# Patient Record
Sex: Female | Born: 1960 | Race: White | Hispanic: No | Smoking: Current every day smoker
Health system: Southern US, Community
[De-identification: ages and names within clinical notes are randomized; demographics above are authoritative.]

## PROBLEM LIST (undated history)

## (undated) DIAGNOSIS — J449 Chronic obstructive pulmonary disease, unspecified: Secondary | ICD-10-CM

## (undated) DIAGNOSIS — K219 Gastro-esophageal reflux disease without esophagitis: Secondary | ICD-10-CM

## (undated) DIAGNOSIS — Z720 Tobacco use: Secondary | ICD-10-CM

## (undated) DIAGNOSIS — G500 Trigeminal neuralgia: Secondary | ICD-10-CM

## (undated) DIAGNOSIS — E559 Vitamin D deficiency, unspecified: Secondary | ICD-10-CM

## (undated) DIAGNOSIS — I10 Essential (primary) hypertension: Secondary | ICD-10-CM

## (undated) DIAGNOSIS — E785 Hyperlipidemia, unspecified: Secondary | ICD-10-CM

## (undated) DIAGNOSIS — L732 Hidradenitis suppurativa: Secondary | ICD-10-CM

## (undated) HISTORY — DX: Tobacco use: Z72.0

## (undated) HISTORY — DX: Essential (primary) hypertension: I10

## (undated) HISTORY — PX: CHOLECYSTECTOMY: SHX55

## (undated) HISTORY — DX: Hyperlipidemia, unspecified: E78.5

## (undated) HISTORY — DX: Trigeminal neuralgia: G50.0

## (undated) HISTORY — PX: DILATION AND CURETTAGE OF UTERUS: SHX78

## (undated) HISTORY — DX: Hidradenitis suppurativa: L73.2

## (undated) HISTORY — DX: Vitamin D deficiency, unspecified: E55.9

---

## 2002-01-26 ENCOUNTER — Encounter: Payer: Self-pay | Admitting: General Surgery

## 2002-01-26 ENCOUNTER — Ambulatory Visit (HOSPITAL_COMMUNITY): Admission: RE | Admit: 2002-01-26 | Discharge: 2002-01-26 | Payer: Self-pay | Admitting: General Surgery

## 2002-04-10 ENCOUNTER — Ambulatory Visit (HOSPITAL_COMMUNITY): Admission: RE | Admit: 2002-04-10 | Discharge: 2002-04-10 | Payer: Self-pay | Admitting: General Surgery

## 2006-12-27 ENCOUNTER — Ambulatory Visit (HOSPITAL_COMMUNITY): Admission: RE | Admit: 2006-12-27 | Discharge: 2006-12-27 | Payer: Self-pay | Admitting: Family Medicine

## 2010-08-03 ENCOUNTER — Encounter (INDEPENDENT_AMBULATORY_CARE_PROVIDER_SITE_OTHER): Payer: Self-pay | Admitting: *Deleted

## 2010-08-08 ENCOUNTER — Ambulatory Visit: Payer: Self-pay | Admitting: Gastroenterology

## 2010-08-22 ENCOUNTER — Ambulatory Visit: Payer: Self-pay | Admitting: Gastroenterology

## 2010-08-23 ENCOUNTER — Encounter: Payer: Self-pay | Admitting: Gastroenterology

## 2011-01-30 NOTE — Letter (Signed)
Summary: [facility] Instructions  [facility]  [address] [place], [place] [address]   Phone: [phone]  Fax: [phone]       [address]    52-Feb-1962    MRN: [id number]        Procedure Day Grace [name]:  Jake Shark  08/22/10     Arrival Time:  8:00AM     Procedure Time:  9:00AM     Location of Procedure:                    _ X_  [facility] (4th Floor)                        PREPARATION FOR COLONOSCOPY WITH MOVIPREP   Starting 5 days prior to your procedure 08/17/10 do not eat nuts, seeds, popcorn, corn, beans, peas,  salads, or any raw vegetables.  Do not take any fiber supplements (e.g. Metamucil, Citrucel, and Benefiber).  THE DAY BEFORE YOUR PROCEDURE         DATE: 08/21/10  DAY: MONDAY  1.  Drink clear liquids the entire day-NO SOLID FOOD  2.  Do not drink anything colored red or purple.  Avoid juices with pulp.  No orange juice.  3.  Drink at least 64 oz. (8 glasses) of fluid/clear liquids during the day to prevent dehydration and help the prep work efficiently.  CLEAR LIQUIDS INCLUDE: Water Jello Ice Popsicles Tea (sugar ok, no milk/cream) Powdered fruit flavored drinks Coffee (sugar ok, no milk/cream) Gatorade Juice: apple, white grape, white cranberry  Lemonade Clear bullion, consomm, broth Carbonated beverages (any kind) Strained chicken noodle soup Hard Candy                             4.  In the morning, mix first dose of MoviPrep solution:    Empty 1 Pouch A and 1 Pouch B into the disposable container    Add lukewarm drinking water to the top line of the container. Mix to dissolve    Refrigerate (mixed solution should be used within 24 hrs)  5.  Begin drinking the prep at 5:00 p.m. The MoviPrep container is divided by 4 marks.   Every 15 minutes drink the solution down to the next mark (approximately 8 oz) until the full liter is complete.   6.  Follow completed prep with 16 oz of clear liquid of your choice (Nothing  red or purple).  Continue to drink clear liquids until bedtime.  7.  Before going to bed, mix second dose of MoviPrep solution:    Empty 1 Pouch A and 1 Pouch B into the disposable container    Add lukewarm drinking water to the top line of the container. Mix to dissolve    Refrigerate  THE DAY OF YOUR PROCEDURE      DATE: 08/22/10  DAY: TUESDAY  Beginning at 4:00AM (5 hours before procedure):         1. Every 15 minutes, drink the solution down to the next mark (approx 8 oz) until the full liter is complete.  2. Follow completed prep with 16 oz. of clear liquid of your choice.    3. You may drink clear liquids until 7:00AM (2 HOURS BEFORE PROCEDURE).   MEDICATION INSTRUCTIONS  Unless otherwise instructed, you should take regular prescription medications with a small sip of water   as early as possible the morning  of your procedure.        OTHER INSTRUCTIONS  You will need a responsible adult at least 52 years of age to accompany you and drive you home.   This person must remain in the waiting room during your procedure.  Wear loose fitting clothing that is easily removed.  Leave jewelry and other valuables at home.  However, you may wish to bring a book to read or  an iPod/MP3 player to listen to music as you wait for your procedure to start.  Remove all body piercing jewelry and leave at home.  Total time from sign-in until discharge is approximately 2-3 hours.  You should go home directly after your procedure and rest.  You can resume normal activities the  day after your procedure.  The day of your procedure you should not:   Drive   Make legal decisions   Operate machinery   Drink alcohol   Return to work  You will receive specific instructions about eating, activities and medications before you leave.    The above instructions have been reviewed and explained to me by   Karl Bales RN  August  52, 2011 11:35 AM    I fully understand and can  verbalize these instructions _____________________________ Date _________

## 2011-01-30 NOTE — Procedures (Signed)
Summary: Colonoscopy  Patient: [name] Note: All result statuses are Final unless otherwise noted.  Tests: (1) Colonoscopy (COL)   COL Colonoscopy           DONE     [facility]     [address]     [place], [place]  [address]           COLONOSCOPY PROCEDURE REPORT           PATIENT:  [name], Grace  MR#:  [id number]     BIRTHDATE:  10-07-1961, 49 yrs. old  GENDER:  female     ENDOSCOPIST:  Judie Petit T. Russella Dar, MD, [facility]     Referred by:  Kyra Manges, M.D.     PROCEDURE DATE:  08/22/2010     PROCEDURE:  Colonoscopy with hot biopsy and snare polypectomy     ASA CLASS:  Class II     INDICATIONS:  1) hematochezia  2) diarrhea     MEDICATIONS:   Fentanyl 75 mcg IV, Versed 9 mg IV     DESCRIPTION OF PROCEDURE:   After the risks benefits and     alternatives of the procedure were thoroughly explained, informed     consent was obtained.  Digital rectal exam was performed and     revealed no abnormalities.   The LB [id number]-[id number] endoscope     was introduced through the anus and advanced to the terminal ileum     which was intubated for a short distance, without limitations.     The quality of the prep was excellent, using MoviPrep.  The     instrument was then slowly withdrawn as the colon was fully     examined.     <<PROCEDUREIMAGES>>     FINDINGS:  Two polyps were found in the transverse colon. They     were 3 - 6 mm in size. Polyps were snared without cautery.     Retrieval was successful. Two polyps were found in the descending     colon. They were 4 - 5 mm in size. Polyps were snared without     cautery. Retrieval was successful. Six polyps were found in the     sigmoid colon. They were 3 - 4 mm in size. The polyps were removed     using hot biopsy forceps.  This was otherwise a normal examination     of the colon.  The terminal ileum appeared normal.  Retroflexed     views in the rectum revealed internal hemorrhoids., moderate. The     time to cecum =  1.5   minutes. The scope was then withdrawn (time     =  9.75  min) from the patient and the procedure completed.           COMPLICATIONS:  None           ENDOSCOPIC IMPRESSION:     1) 3 - 6 mm Two polyps in the transverse colon     2) 4 - 5 mm Two polyps in the descending colon     3) 3 - 4 mm Six polyps in the sigmoid colon     4) Normal terminal ileum     5) Internal hemorrhoids           RECOMMENDATIONS:     1) No aspirin or NSAID's for 2 weeks     2) Await pathology results     3) Repeat Colonoscopy in 3-5 years if  polyps adenomatous.     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 Russella Dar, MD, [facility]           n.     eSIGNED:   Venita Lick. Stark at 08/22/2010 09:35 AM           Charlsie Quest, [id number]  Note: An exclamation mark (!) indicates a result that was not dispersed into the flowsheet. Document Creation Date: 08/22/2010 9:36 AM _______________________________________________________________________  (1) Order result status: Final Collection or observation date-time: 08/22/2010 09:22 Requested date-time:  Receipt date-time:  Reported date-time:  Referring Physician:   Ordering Physician: Claudette Head [phone]) Specimen Source:  Source: Launa Grill Order Number: [id number] Lab site:   Appended Document: Colonoscopy     Procedures Next Due Date:    Colonoscopy: 07/2013

## 2011-01-30 NOTE — Miscellaneous (Signed)
Summary: [facility] previsit  Clinical Lists Changes  Medications: Added new medication of MOVIPREP 100 GM  SOLR (PEG-KCL-NACL-NASULF-NA ASC-C) As per prep instructions. - Signed Rx of MOVIPREP 100 GM  SOLR (PEG-KCL-NACL-NASULF-NA ASC-C) As per prep instructions.;  #1 x 0;  Signed;  Entered by: Karl Bales RN;  Authorized by: Meryl Dare MD [facility];  Method used: Electronically to [facility], [place], [place], [place]  [address], Ph: [phone], Fax: [phone] Allergies: Added new allergy or adverse reaction of PREDNISONE Observations: Added new observation of NKA: F (08/08/2010 11:04)    Prescriptions: MOVIPREP 100 GM  SOLR (PEG-KCL-NACL-NASULF-NA ASC-C) As per prep instructions.  #1 x 0   Entered by:   Karl Bales RN   Authorized by:   Meryl Dare MD [facility]   Signed by:   Karl Bales RN on 08/08/2010   Method used:   Electronically to        [facility] [address] (retail)       [address]       [place], [place]  [address]       Ph: [phone]       Fax: [phone]   RxID:   [id number]

## 2011-01-30 NOTE — Letter (Signed)
Summary: Patient Notice- Polyp Results  [facility]  [address] [place], [place] [address]   Phone: [phone]  Fax: [phone]        August 23, 2010 MRN: [id number]    HAILEIGH PITZ [address] [place], [place]  [address]    Dear Ms. Sellin,  I am pleased to inform you that the colon polyp(s) removed during your recent colonoscopy was (were) found to be benign (no cancer detected) upon pathologic examination.  I recommend you have a repeat colonoscopy examination in 3 years to look for recurrent polyps, as having colon polyps increases your risk for having recurrent polyps or even colon cancer in the future.  Should you develop new or worsening symptoms of abdominal pain, bowel habit changes or bleeding from the rectum or bowels, please schedule an evaluation with either your primary care physician or with me.  Continue treatment plan as outlined the day of your exam.  Please call us if you are having persistent problems or have questions about your condition that have not been fully answered at this time.  Sincerely,  Meryl Dare MD [facility]  This letter has been electronically signed by your physician.  Appended Document: Patient Notice- Polyp Results letter mailed

## 2011-05-18 NOTE — Op Note (Signed)
[facility]  Patient:    Grace [name], Grace [name] Visit Number: [id number] MRN: [id number]          Service Type: DSU Location: DAY Attending Physician:  Dalia Heading Dictated by:   Franky Macho, M.D. Proc. Date: 04/10/02 Admit Date:  04/10/2002   CC:         Colette Ribas, M.D.   Operative Report  PREOPERATIVE DIAGNOSIS:  Chronic cholecystitis.  POSTOPERATIVE DIAGNOSIS:  Chronic cholecystitis.  OPERATION/PROCEDURE:  Laparoscopic cholecystectomy.  SURGEON:  Franky Macho, M.D.  ASSISTANT:  Arna Snipe, M.D.  ANESTHESIA:  General endotracheal anesthesia.  INDICATIONS:  The patient is a 50 year old white female who presents with biliary colic secondary to chronic cholecystitis.  The risks and benefits of the procedure including bleeding, infection, hepatobiliary injury, and the possibility of an open procedure were fully explained to the patient, who gave informed consent.  DESCRIPTION OF PROCEDURE:  The patient was placed in the supine position. After induction of general endotracheal anesthesia, the abdomen was prepped and draped using the usual sterile technique with Betadine.  A subumbilical incision was made down to the fascia.  A Veress needle was introduced into the abdominal cavity and confirmation of placement was done using the saline drop test to the abdomen which was then insufflated to 16 mmHg pressure.  An 11 mm trocar was introduced into the abdominal cavity under direct visualization without difficulty.  The patient was placed in reverse Trendelenburg position and additional 11 mm trocar was placed in the epigastric region and 5 mm trocars were placed in the right upper quadrant and right flank regions.  Liver was inspected and noted to be within normal limits.  The gallbladder was retracted superiorly and laterally.  The dissection was begun around the infundibulum of the gallbladder.  The cystic duct was first  identified.  Its junction to the infundibulum was first identified and the clips were placed proximally and distally on the cystic duct and cystic duct was divided.  This was likewise done to the cystic artery.  The gallbladder was then freed away from the gallbladder fossa using Bovie electrocautery.  The gallbladder was delivered through the epigastric trocar site without difficulty.  The gallbladder fossa was inspected and no abnormal bleeding or bile leakage was noted.  Surgicel was placed in the gallbladder fossa.  Subhepatic space as well as the right hepatic gutter were irrigated with normal saline.  All fluid and air were then evacuated from the abdominal cavity prior to removal of the trocars.  All wounds were irrigated with normal saline.  All wounds were injected with 0.5% Marcaine.  The subumbilical fascia was reapproximated using an 0 Vicryl interrupted suture.  All skin incisions were closed using staples.  Betadine ointment and dry sterile dressings were applied.  All tape and needle counts correct at the end of the procedure.  The patient was extubated in the operating room and went back to the recovery room awake in stable condition. No complications.  Specimen was the gallbladder.  Blood loss minimal. Dictated by:   Franky Macho, M.D. Attending Physician:  Dalia Heading DD:  04/10/02 TD:  04/11/02 Job: [id number]

## 2011-05-18 NOTE — H&P (Signed)
[facility]  Patient:    Grace [name], Grace [name] Visit Number: [id number] MRN: [id number]          Service Type: OUT Location: RAD Attending Physician:  Dalia Heading Dictated by:   Franky Macho, M.D. Admit Date:  01/26/2002 Discharge Date: 01/26/2002   CC:         Colette Ribas, M.D.   History and Physical  AGE:  50 years old.  CHIEF COMPLAINT:  Chronic cholecystitis.  HISTORY OF PRESENT ILLNESS:  Patient is a 50 year old white female who was referred for evaluation and treatment of epigastric pain and bloating.  She has had reflux symptoms for many months which are currently controlled with Nexium.  They are worse at night when lying down.  She does have intermittent upper abdominal pain with radiation to the back, nausea, vomiting, and some fatty food intolerance.  No fever, chills, or jaundice have been noted.  She does have postprandial symptoms.  PAST MEDICAL HISTORY:  Extrinsic allergies.  PAST SURGICAL HISTORY:  D&C, oral surgery.  CURRENT MEDICATIONS:  Nexium, Depo-Provera shots, Nasonex.  ALLERGIES:  PREDNISONE.  REVIEW OF SYSTEMS:  Patient does smoke a pack of cigarettes a day.  She denies any significant alcohol use.  She denies any other cardiopulmonary difficulties or bleeding disorders.  PHYSICAL EXAMINATION:  GENERAL:  Patient is a well-developed, well-nourished white female in no acute distress.  VITAL SIGNS:  She is afebrile and vital signs are stable.  HEENT:  No scleral icterus.  LUNGS:  Clear to auscultation with equal breath sounds bilaterally.  HEART:  Regular rate and rhythm without S3, S4, or murmurs.  ABDOMEN:  Soft, nontender, and nondistended.  No hepatosplenomegaly or masses are noted.  No herniae are noted.  LABORATORY AND ACCESSORY DATA:  Ultrasound of the gallbladder is negative.  Hepatobiliary scan reveals chronic cholecystitis with a low gallbladder ejection fraction at 19%.   Reproducible symptoms are noted.  IMPRESSION:  Chronic cholecystitis.  PLAN:  The patient is scheduled for a laparoscopic cholecystectomy on April 10, 2002.  The risks and benefits of the procedure including bleeding, infection, hepatobiliary injury, and the possibility of an open procedure were fully explained to the patient, who gave informed consent. Dictated by:   Franky Macho, M.D. Attending Physician:  Dalia Heading DD:  03/19/02 TD:  03/21/02 Job: [id number]

## 2013-04-06 ENCOUNTER — Other Ambulatory Visit: Payer: Self-pay | Admitting: Family Medicine

## 2013-04-07 ENCOUNTER — Other Ambulatory Visit: Payer: Self-pay | Admitting: Family Medicine

## 2013-04-07 MED ORDER — HYDROCODONE-ACETAMINOPHEN 10-500 MG PO TABS: 1.0000 | ORAL_TABLET | Freq: Three times a day (TID) | ORAL | Status: DC | PRN

## 2013-04-07 NOTE — Telephone Encounter (Signed)
Last seen 01/15/13, last filled 03/04/13. If approved have nurse call her at [phone] to pick up

## 2013-04-09 ENCOUNTER — Other Ambulatory Visit: Payer: Self-pay | Admitting: Family Medicine

## 2013-04-09 DIAGNOSIS — G500 Trigeminal neuralgia: Secondary | ICD-10-CM

## 2013-04-09 MED ORDER — HYDROCODONE-ACETAMINOPHEN 10-325 MG PO TABS: ORAL_TABLET | ORAL | Status: DC

## 2013-05-15 ENCOUNTER — Encounter: Payer: Self-pay | Admitting: Family Medicine

## 2013-05-15 ENCOUNTER — Ambulatory Visit (INDEPENDENT_AMBULATORY_CARE_PROVIDER_SITE_OTHER): Payer: BLUE CROSS/BLUE SHIELD | Admitting: Family Medicine

## 2013-05-15 VITALS — BP 119/76 | HR 93 | Temp 99.0°F | Ht 65.0 in | Wt 175.8 lb

## 2013-05-15 DIAGNOSIS — G500 Trigeminal neuralgia: Secondary | ICD-10-CM

## 2013-05-15 DIAGNOSIS — E559 Vitamin D deficiency, unspecified: Secondary | ICD-10-CM

## 2013-05-15 DIAGNOSIS — I10 Essential (primary) hypertension: Secondary | ICD-10-CM | POA: Insufficient documentation

## 2013-05-15 DIAGNOSIS — E785 Hyperlipidemia, unspecified: Secondary | ICD-10-CM | POA: Insufficient documentation

## 2013-05-15 LAB — HEPATIC FUNCTION PANEL
ALT: 13 U/L (ref 0–35)
AST: 14 U/L (ref 0–37)
Albumin: 4.3 g/dL (ref 3.5–5.2)
Alkaline Phosphatase: 52 U/L (ref 39–117)
Bilirubin, Direct: 0.1 mg/dL (ref 0.0–0.3)
Indirect Bilirubin: 0.3 mg/dL (ref 0.0–0.9)
Total Bilirubin: 0.4 mg/dL (ref 0.3–1.2)
Total Protein: 6.7 g/dL (ref 6.0–8.3)

## 2013-05-15 LAB — BASIC METABOLIC PANEL WITH GFR
BUN: 10 mg/dL (ref 6–23)
CO2: 22 mEq/L (ref 19–32)
Calcium: 9.6 mg/dL (ref 8.4–10.5)
Chloride: 108 mEq/L (ref 96–112)
Creat: 0.82 mg/dL (ref 0.50–1.10)
GFR, Est African American: >89 mL/min
GFR, Est Non African American: 82 mL/min
Glucose, Bld: 99 mg/dL (ref 70–99)
Potassium: 4.3 mEq/L (ref 3.5–5.3)
Sodium: 139 mEq/L (ref 135–145)

## 2013-05-15 LAB — LIPID PANEL
Cholesterol: 211 mg/dL — ABNORMAL HIGH (ref 0–200)
HDL: 39 mg/dL — ABNORMAL LOW (ref >39)
LDL Cholesterol: 146 mg/dL — ABNORMAL HIGH (ref 0–99)
Total CHOL/HDL Ratio: 5.4 Ratio
Triglycerides: 130 mg/dL (ref <150)
VLDL: 26 mg/dL (ref 0–40)

## 2013-05-15 MED ORDER — HYDROCODONE-ACETAMINOPHEN 10-325 MG PO TABS: ORAL_TABLET | ORAL | Status: DC

## 2013-05-15 MED ORDER — LOSARTAN POTASSIUM 25 MG PO TABS: 25.0000 mg | ORAL_TABLET | Freq: Every day | ORAL | Status: DC

## 2013-05-15 MED ORDER — AMLODIPINE BESYLATE 10 MG PO TABS: 10.0000 mg | ORAL_TABLET | Freq: Every day | ORAL | Status: DC

## 2013-05-15 NOTE — Patient Instructions (Addendum)
Smoking Cessation Quitting smoking is important to your health and has many advantages. However, it is not always easy to quit since nicotine is a very addictive drug. Often times, people try 3 times or more before being able to quit. This document explains the best ways for you to prepare to quit smoking. Quitting takes hard work and a lot of effort, but you can do it. ADVANTAGES OF QUITTING SMOKING  You will live longer, feel better, and live better.  Your body will feel the impact of quitting smoking almost immediately.  Within 20 minutes, blood pressure decreases. Your pulse returns to its normal level.  After 8 hours, carbon monoxide levels in the blood return to normal. Your oxygen level increases.  After 24 hours, the chance of having a heart attack starts to decrease. Your breath, hair, and body stop smelling like smoke.  After 48 hours, damaged nerve endings begin to recover. Your sense of taste and smell improve.  After 72 hours, the body is virtually free of nicotine. Your bronchial tubes relax and breathing becomes easier.  After 2 to 12 weeks, lungs can hold more air. Exercise becomes easier and circulation improves.  The risk of having a heart attack, stroke, cancer, or lung disease is greatly reduced.  After 1 year, the risk of coronary heart disease is cut in half.  After 5 years, the risk of stroke falls to the same as a nonsmoker.  After 10 years, the risk of lung cancer is cut in half and the risk of other cancers decreases significantly.  After 15 years, the risk of coronary heart disease drops, usually to the level of a nonsmoker.  If you are pregnant, quitting smoking will improve your chances of having a healthy baby.  The people you live with, especially any children, will be healthier.  You will have extra money to spend on things other than cigarettes. QUESTIONS TO THINK ABOUT BEFORE ATTEMPTING TO QUIT You may want to talk about your answers with your  caregiver.  Why do you want to quit?  If you tried to quit in the past, what helped and what did not?  What will be the most difficult situations for you after you quit? How will you plan to handle them?  Who can help you through the tough times? Your family? Friends? A caregiver?  What pleasures do you get from smoking? What ways can you still get pleasure if you quit? Here are some questions to ask your caregiver:  How can you help me to be successful at quitting?  What medicine do you think would be best for me and how should I take it?  What should I do if I need more help?  What is smoking withdrawal like? How can I get information on withdrawal? GET READY  Set a quit date.  Change your environment by getting rid of all cigarettes, ashtrays, matches, and lighters in your home, car, or work. Do not let people smoke in your home.  Review your past attempts to quit. Think about what worked and what did not. GET SUPPORT AND ENCOURAGEMENT You have a better chance of being successful if you have help. You can get support in many ways.  Tell your family, friends, and co-workers that you are going to quit and need their support. Ask them not to smoke around you.  Get individual, group, or telephone counseling and support. Programs are available at local hospitals and health centers. Call your local health department for   information about programs in your area.  Spiritual beliefs and practices may help some smokers quit.  Download a "quit meter" on your computer to keep track of quit statistics, such as how long you have gone without smoking, cigarettes not smoked, and money saved.  Get a self-help book about quitting smoking and staying off of tobacco. LEARN NEW SKILLS AND BEHAVIORS  Distract yourself from urges to smoke. Talk to someone, go for a walk, or occupy your time with a task.  Change your normal routine. Take a different route to work. Drink tea instead of coffee.  Eat breakfast in a different place.  Reduce your stress. Take a hot bath, exercise, or read a book.  Plan something enjoyable to do every day. Reward yourself for not smoking.  Explore interactive web-based programs that specialize in helping you quit. GET MEDICINE AND USE IT CORRECTLY Medicines can help you stop smoking and decrease the urge to smoke. Combining medicine with the above behavioral methods and support can greatly increase your chances of successfully quitting smoking.  Nicotine replacement therapy helps deliver nicotine to your body without the negative effects and risks of smoking. Nicotine replacement therapy includes nicotine gum, lozenges, inhalers, nasal sprays, and skin patches. Some may be available over-the-counter and others require a prescription.  Antidepressant medicine helps people abstain from smoking, but how this works is unknown. This medicine is available by prescription.  Nicotinic receptor partial agonist medicine simulates the effect of nicotine in your brain. This medicine is available by prescription. Ask your caregiver for advice about which medicines to use and how to use them based on your health history. Your caregiver will tell you what side effects to look out for if you choose to be on a medicine or therapy. Carefully read the information on the package. Do not use any other product containing nicotine while using a nicotine replacement product.  RELAPSE OR DIFFICULT SITUATIONS Most relapses occur within the first 3 months after quitting. Do not be discouraged if you start smoking again. Remember, most people try several times before finally quitting. You may have symptoms of withdrawal because your body is used to nicotine. You may crave cigarettes, be irritable, feel very hungry, cough often, get headaches, or have difficulty concentrating. The withdrawal symptoms are only temporary. They are strongest when you first quit, but they will go away within  10 14 days. To reduce the chances of relapse, try to:  Avoid drinking alcohol. Drinking lowers your chances of successfully quitting.  Reduce the amount of caffeine you consume. Once you quit smoking, the amount of caffeine in your body increases and can give you symptoms, such as a rapid heartbeat, sweating, and anxiety.  Avoid smokers because they can make you want to smoke.  Do not let weight gain distract you. Many smokers will gain weight when they quit, usually less than 10 pounds. Eat a healthy diet and stay active. You can always lose the weight gained after you quit.  Find ways to improve your mood other than smoking. FOR MORE INFORMATION  www.smokefree.gov  Document Released: 12/11/2001 Document Revised: 06/17/2012 Document Reviewed: 03/27/2012 [employer] Patient Information 2013 [employer].       Dr Channon Ambrosini's Recommendations  Diet and Exercise discussed with patient.  For nutrition information, I recommend books:  1).Eat to Live by Dr Joel Fuhrman. 2).Prevent and Reverse Heart Disease by Dr Caldwell Esselstyn. 3) Dr Neal Barnard's Book: Reversing Diabetes  Exercise recommendations are:  If unable to walk, then   the patient can exercise in a chair 3 times a day. By flapping arms like a bird gently and raising legs outwards to the front.  If ambulatory, the patient can go for walks for 30 minutes 3 times a week. Then increase the intensity and duration as tolerated.  Goal is to try to attain exercise frequency to 5 times a week.  If applicable: Best to perform resistance exercises (machines or weights) 2 days a week and cardio type exercises 3 days per week.  

## 2013-05-15 NOTE — Progress Notes (Signed)
Patient ID: Grace [name], female   DOB: 19-Apr-1961, 52 y.o.   MRN: [id number] SUBJECTIVE: HPI: Patient is here for follow up of hypertension:  denies Headache;deniesChest Pain;denies weakness;denies Shortness of Breath or Orthopnea;denies Visual changes;denies palpitations;denies cough;denies pedal edema;denies symptoms of TIA or stroke; admits to Compliance with medications. denies Problems with medications.  Trigeminal neuralgia:better during warmer weather  Hyperlipidemia.   PMH/PSH: reviewed/updated in Epic  SH/FH: reviewed/updated in Epic  Allergies: reviewed/updated in Epic  Medications: reviewed/updated in Epic  Immunizations: reviewed/updated in Epic  ROS: As above in the HPI. All other systems are stable or negative.  OBJECTIVE: APPEARANCE:  Patient in no acute distress.The patient appeared well nourished and normally developed. Acyanotic. Waist: VITAL SIGNS:BP 119/76  Pulse 93  Temp(Src) 99 F (37.2 C) (Oral)  Ht 5\' 5"  (1.651 m)  Wt 175 lb 12.8 oz (79.742 kg)  BMI 29.25 kg/m2 overweight WF  SKIN: warm and  Dry without overt rashes, tattoos and scars  HEAD and Neck: without JVD, Head and scalp: normal Eyes:No scleral icterus. Fundi normal, eye movements normal. Ears: Auricle normal, canal normal, Tympanic membranes normal, insufflation normal. Nose: normal Throat: normal Neck & thyroid: normal  CHEST & LUNGS: Chest wall: normal Lungs: Clear  CVS: Reveals the PMI to be normally located. Regular rhythm, First and Second Heart sounds are normal,  absence of murmurs, rubs or gallops. Peripheral vasculature: Radial pulses: normal Dorsal pedis pulses: normal Posterior pulses: normal  ABDOMEN:  Appearance: normal Benign,, no organomegaly, no masses, no Abdominal Aortic enlargement. No Guarding , no rebound. No Bruits. Bowel sounds: normal  RECTAL: N/A GU: N/A  EXTREMETIES: nonedematous. Both Femoral and Pedal pulses are  normal.  MUSCULOSKELETAL:  Spine: normal Joints: intact  NEUROLOGIC: oriented to time,place and person; nonfocal. Strength is normal Sensory is normal Reflexes are normal Cranial Nerves are normal. The trigeminal neuralgia has settled down today with less cold weather.   ASSESSMENT:  HTN (hypertension) - Plan: losartan (COZAAR) 25 MG tablet, amLODipine (NORVASC) 10 MG tablet, BASIC METABOLIC PANEL WITH GFR  HLD (hyperlipidemia) - Plan: Hepatic function panel, Lipid panel  Trigeminal neuralgia - Plan: HYDROcodone-acetaminophen (NORCO) 10-325 MG per tablet, DISCONTINUED: HYDROcodone-acetaminophen (NORCO) 10-325 MG per tablet  Unspecified vitamin D deficiency - Plan: Vitamin D 25 hydroxy   PLAN: Orders Placed This Encounter  Procedures  . Vitamin D 25 hydroxy  . BASIC METABOLIC PANEL WITH GFR  . Hepatic function panel  . Lipid panel   No results found for this or any previous visit. Meds ordered this encounter  Medications  . medroxyPROGESTERone (DEPO-PROVERA) 150 MG/ML injection    Sig: Inject 150 mg into the muscle every 3 (three) months.  . DISCONTD: losartan (COZAAR) 25 MG tablet    Sig: Take 25 mg by mouth daily.  Marland Kitchen DISCONTD: amLODipine (NORVASC) 10 MG tablet    Sig: Take 10 mg by mouth daily.  Marland Kitchen DISCONTD: HYDROcodone-acetaminophen (NORCO) 10-325 MG per tablet    Sig: TAKE ONE TABLET BY MOUTH TWICE DAILY AS NEEDED FOR  TRIGEMINAL  NEURALGIA    Dispense:  60 tablet    Refill:  1  . losartan (COZAAR) 25 MG tablet    Sig: Take 1 tablet (25 mg total) by mouth daily.    Dispense:  90 tablet    Refill:  3  . amLODipine (NORVASC) 10 MG tablet    Sig: Take 1 tablet (10 mg total) by mouth daily.    Dispense:  90 tablet    Refill:  3  . HYDROcodone-acetaminophen (NORCO) 10-325 MG per tablet    Sig: TAKE ONE TABLET BY MOUTH TWICE DAILY AS NEEDED FOR  TRIGEMINAL  NEURALGIA    Dispense:  60 tablet    Refill:  1        Dr Woodroe Mode Recommendations  Diet and  Exercise discussed with patient.  For nutrition information, I recommend books:  1).Eat to Live by Dr Monico Hoar. 2).Prevent and Reverse Heart Disease by Dr Suzzette Righter. 3) Dr Katherina Right Book: Reversing Diabetes  Exercise recommendations are:  If unable to walk, then the patient can exercise in a chair 3 times a day. By flapping arms like a bird gently and raising legs outwards to the front.  If ambulatory, the patient can go for walks for 30 minutes 3 times a week. Then increase the intensity and duration as tolerated.  Goal is to try to attain exercise frequency to 5 times a week.  If applicable: Best to perform resistance exercises (machines or weights) 2 days a week and cardio type exercises 3 days per week.  RTC 4 months  Kennen Stammer P. [place] Charon, M.D.

## 2013-05-16 LAB — VITAMIN D 25 HYDROXY (VIT D DEFICIENCY, FRACTURES): Vit D, 25-Hydroxy: 43 ng/mL (ref 30–89)

## 2013-05-20 ENCOUNTER — Other Ambulatory Visit: Payer: Self-pay | Admitting: Family Medicine

## 2013-05-20 DIAGNOSIS — E785 Hyperlipidemia, unspecified: Secondary | ICD-10-CM

## 2013-05-20 MED ORDER — PRAVASTATIN SODIUM 20 MG PO TABS: 20.0000 mg | ORAL_TABLET | Freq: Every day | ORAL | Status: DC

## 2013-05-22 ENCOUNTER — Other Ambulatory Visit: Payer: Self-pay | Admitting: Family Medicine

## 2013-05-22 DIAGNOSIS — E785 Hyperlipidemia, unspecified: Secondary | ICD-10-CM

## 2013-05-22 MED ORDER — FENOFIBRATE 48 MG PO TABS: 48.0000 mg | ORAL_TABLET | Freq: Every day | ORAL | Status: DC

## 2013-06-01 ENCOUNTER — Ambulatory Visit (INDEPENDENT_AMBULATORY_CARE_PROVIDER_SITE_OTHER): Payer: BLUE CROSS/BLUE SHIELD | Admitting: Nurse Practitioner

## 2013-06-01 ENCOUNTER — Telehealth: Payer: Self-pay | Admitting: Nurse Practitioner

## 2013-06-01 ENCOUNTER — Encounter: Payer: Self-pay | Admitting: Nurse Practitioner

## 2013-06-01 ENCOUNTER — Telehealth: Payer: Self-pay | Admitting: Family Medicine

## 2013-06-01 VITALS — BP 137/76 | HR 105 | Temp 98.9°F | Ht 65.0 in | Wt 177.0 lb

## 2013-06-01 DIAGNOSIS — L02234 Carbuncle of groin: Secondary | ICD-10-CM

## 2013-06-01 MED ORDER — CIPROFLOXACIN HCL 500 MG PO TABS
500.0000 mg | ORAL_TABLET | Freq: Two times a day (BID) | ORAL | Status: DC
Start: 2013-06-01 — End: 2013-09-17

## 2013-06-01 NOTE — Telephone Encounter (Signed)
APPT MADE

## 2013-06-01 NOTE — Progress Notes (Signed)
  Subjective:    Patient ID: Grace [name], female    DOB: 1961/10/11, 52 y.o.   MRN: [id number]  HPI Patient has a lesion on left groin- Noticed several days ago- has gotten bigger- No drainage    Review of Systems  All other systems reviewed and are negative.       Objective:   Physical Exam  Constitutional: She appears well-developed and well-nourished.  Cardiovascular: Normal rate, normal heart sounds and intact distal pulses.   Pulmonary/Chest: Effort normal and breath sounds normal.  Skin: Skin is warm. There is erythema.  Indurated erythematous annular lesion left groin area    BP 137/76  Pulse 105  Temp(Src) 98.9 F (37.2 C) (Oral)  Ht 5\' 5"  (1.651 m)  Wt 177 lb (80.287 kg)  BMI 29.45 kg/m2       Assessment & Plan:  1. Carbuncle of groin Warm compress Ok to squeeze if comes to a head RTO prn - ciprofloxacin (CIPRO) 500 MG tablet; Take 1 tablet (500 mg total) by mouth 2 (two) times daily.  Dispense: 20 tablet; Refill: 0  Mary-Margaret Daphine Deutscher, FNP

## 2013-06-01 NOTE — Patient Instructions (Signed)
Abscess An abscess is an infected area that contains a collection of pus and debris.It can occur in almost any part of the body. An abscess is also known as a furuncle or boil. CAUSES  An abscess occurs when tissue gets infected. This can occur from blockage of oil or sweat glands, infection of hair follicles, or a minor injury to the skin. As the body tries to fight the infection, pus collects in the area and creates pressure under the skin. This pressure causes pain. People with weakened immune systems have difficulty fighting infections and get certain abscesses more often.  SYMPTOMS Usually an abscess develops on the skin and becomes a painful mass that is red, warm, and tender. If the abscess forms under the skin, you may feel a moveable soft area under the skin. Some abscesses break open (rupture) on their own, but most will continue to get worse without care. The infection can spread deeper into the body and eventually into the bloodstream, causing you to feel ill.  DIAGNOSIS  Your caregiver will take your medical history and perform a physical exam. A sample of fluid may also be taken from the abscess to determine what is causing your infection. TREATMENT  Your caregiver may prescribe antibiotic medicines to fight the infection. However, taking antibiotics alone usually does not cure an abscess. Your caregiver may need to make a small cut (incision) in the abscess to drain the pus. In some cases, gauze is packed into the abscess to reduce pain and to continue draining the area. HOME CARE INSTRUCTIONS   Only take over-the-counter or prescription medicines for pain, discomfort, or fever as directed by your caregiver.  If you were prescribed antibiotics, take them as directed. Finish them even if you start to feel better.  If gauze is used, follow your caregiver's directions for changing the gauze.  To avoid spreading the infection:  Keep your draining abscess covered with a  bandage.  Wash your hands well.  Do not share personal care items, towels, or whirlpools with others.  Avoid skin contact with others.  Keep your skin and clothes clean around the abscess.  Keep all follow-up appointments as directed by your caregiver. SEEK MEDICAL CARE IF:   You have increased pain, swelling, redness, fluid drainage, or bleeding.  You have muscle aches, chills, or a general ill feeling.  You have a fever. MAKE SURE YOU:   Understand these instructions.  Will watch your condition.  Will get help right away if you are not doing well or get worse. Document Released: 09/26/2005 Document Revised: 06/17/2012 Document Reviewed: 02/29/2012 Exit[employer] Patient Information 2014 [employer].  

## 2013-06-01 NOTE — Telephone Encounter (Signed)
OK to crush them

## 2013-06-01 NOTE — Telephone Encounter (Signed)
Patient aware.

## 2013-07-21 ENCOUNTER — Other Ambulatory Visit: Payer: Self-pay | Admitting: Family Medicine

## 2013-07-23 NOTE — Telephone Encounter (Signed)
Last seen MMM 06/01/13  Last filled 05/15/13   If approved print and route to nurse

## 2013-07-24 ENCOUNTER — Telehealth: Payer: Self-pay | Admitting: *Deleted

## 2013-07-24 NOTE — Telephone Encounter (Signed)
Called into pharmacy and left on voicemail 

## 2013-07-24 NOTE — Telephone Encounter (Signed)
Pt aware, script for Norco ready for pick up here.

## 2013-08-03 ENCOUNTER — Encounter: Payer: Self-pay | Admitting: Gastroenterology

## 2013-09-04 ENCOUNTER — Other Ambulatory Visit: Payer: Self-pay | Admitting: Family Medicine

## 2013-09-15 ENCOUNTER — Ambulatory Visit: Payer: BLUE CROSS/BLUE SHIELD | Admitting: Family Medicine

## 2013-09-17 ENCOUNTER — Encounter: Payer: Self-pay | Admitting: Family Medicine

## 2013-09-17 ENCOUNTER — Ambulatory Visit (INDEPENDENT_AMBULATORY_CARE_PROVIDER_SITE_OTHER): Payer: BLUE CROSS/BLUE SHIELD | Admitting: Family Medicine

## 2013-09-17 VITALS — BP 124/68 | HR 96 | Temp 99.7°F | Ht 64.0 in | Wt 177.0 lb

## 2013-09-17 DIAGNOSIS — G500 Trigeminal neuralgia: Secondary | ICD-10-CM

## 2013-09-17 DIAGNOSIS — E559 Vitamin D deficiency, unspecified: Secondary | ICD-10-CM | POA: Insufficient documentation

## 2013-09-17 DIAGNOSIS — E785 Hyperlipidemia, unspecified: Secondary | ICD-10-CM | POA: Insufficient documentation

## 2013-09-17 DIAGNOSIS — I10 Essential (primary) hypertension: Secondary | ICD-10-CM | POA: Insufficient documentation

## 2013-09-17 DIAGNOSIS — Z720 Tobacco use: Secondary | ICD-10-CM | POA: Insufficient documentation

## 2013-09-17 DIAGNOSIS — F17200 Nicotine dependence, unspecified, uncomplicated: Secondary | ICD-10-CM

## 2013-09-17 MED ORDER — LOSARTAN POTASSIUM 25 MG PO TABS: ORAL_TABLET | ORAL | Status: DC

## 2013-09-17 MED ORDER — AMLODIPINE BESYLATE 10 MG PO TABS: ORAL_TABLET | ORAL | Status: DC

## 2013-09-17 NOTE — Patient Instructions (Addendum)
    Dr Kewanda Poland's Recommendations  For nutrition information, I recommend books:  1).Eat to Live by Dr Joel Fuhrman. 2).Prevent and Reverse Heart Disease by Dr Caldwell Esselstyn. 3) Dr Neal Barnard's Book:  Program to Reverse Diabetes  Exercise recommendations are:  If unable to walk, then the patient can exercise in a chair 3 times a day. By flapping arms like a bird gently and raising legs outwards to the front.  If ambulatory, the patient can go for walks for 30 minutes 3 times a week. Then increase the intensity and duration as tolerated.  Goal is to try to attain exercise frequency to 5 times a week.  If applicable: Best to perform resistance exercises (machines or weights) 2 days a week and cardio type exercises 3 days per week.   Smoking Cessation Quitting smoking is important to your health and has many advantages. However, it is not always easy to quit since nicotine is a very addictive drug. Often times, people try 3 times or more before being able to quit. This document explains the best ways for you to prepare to quit smoking. Quitting takes hard work and a lot of effort, but you can do it. ADVANTAGES OF QUITTING SMOKING  You will live longer, feel better, and live better.  Your body will feel the impact of quitting smoking almost immediately.  Within 20 minutes, blood pressure decreases. Your pulse returns to its normal level.  After 8 hours, carbon monoxide levels in the blood return to normal. Your oxygen level increases.  After 24 hours, the chance of having a heart attack starts to decrease. Your breath, hair, and body stop smelling like smoke.  After 48 hours, damaged nerve endings begin to recover. Your sense of taste and smell improve.  After 72 hours, the body is virtually free of nicotine. Your bronchial tubes relax and breathing becomes easier.  After 2 to 12 weeks, lungs can hold more air. Exercise becomes easier and circulation improves.  The risk  of having a heart attack, stroke, cancer, or lung disease is greatly reduced.  After 1 year, the risk of coronary heart disease is cut in half.  After 5 years, the risk of stroke falls to the same as a nonsmoker.  After 10 years, the risk of lung cancer is cut in half and the risk of other cancers decreases significantly.  After 15 years, the risk of coronary heart disease drops, usually to the level of a nonsmoker.  If you are pregnant, quitting smoking will improve your chances of having a healthy baby.  The people you live with, especially any children, will be healthier.  You will have extra money to spend on things other than cigarettes. QUESTIONS TO THINK ABOUT BEFORE ATTEMPTING TO QUIT You may want to talk about your answers with your caregiver.  Why do you want to quit?  If you tried to quit in the past, what helped and what did not?  What will be the most difficult situations for you after you quit? How will you plan to handle them?  Who can help you through the tough times? Your family? Friends? A caregiver?  What pleasures do you get from smoking? What ways can you still get pleasure if you quit? Here are some questions to ask your caregiver:  How can you help me to be successful at quitting?  What medicine do you think would be best for me and how should I take it?  What should I   do if I need more help?  What is smoking withdrawal like? How can I get information on withdrawal? GET READY  Set a quit date.  Change your environment by getting rid of all cigarettes, ashtrays, matches, and lighters in your home, car, or work. Do not let people smoke in your home.  Review your past attempts to quit. Think about what worked and what did not. GET SUPPORT AND ENCOURAGEMENT You have a better chance of being successful if you have help. You can get support in many ways.  Tell your family, friends, and co-workers that you are going to quit and need their support. Ask  them not to smoke around you.  Get individual, group, or telephone counseling and support. Programs are available at local hospitals and health centers. Call your local health department for information about programs in your area.  Spiritual beliefs and practices may help some smokers quit.  Download a "quit meter" on your computer to keep track of quit statistics, such as how long you have gone without smoking, cigarettes not smoked, and money saved.  Get a self-help book about quitting smoking and staying off of tobacco. LEARN NEW SKILLS AND BEHAVIORS  Distract yourself from urges to smoke. Talk to someone, go for a walk, or occupy your time with a task.  Change your normal routine. Take a different route to work. Drink tea instead of coffee. Eat breakfast in a different place.  Reduce your stress. Take a hot bath, exercise, or read a book.  Plan something enjoyable to do every day. Reward yourself for not smoking.  Explore interactive web-based programs that specialize in helping you quit. GET MEDICINE AND USE IT CORRECTLY Medicines can help you stop smoking and decrease the urge to smoke. Combining medicine with the above behavioral methods and support can greatly increase your chances of successfully quitting smoking.  Nicotine replacement therapy helps deliver nicotine to your body without the negative effects and risks of smoking. Nicotine replacement therapy includes nicotine gum, lozenges, inhalers, nasal sprays, and skin patches. Some may be available over-the-counter and others require a prescription.  Antidepressant medicine helps people abstain from smoking, but how this works is unknown. This medicine is available by prescription.  Nicotinic receptor partial agonist medicine simulates the effect of nicotine in your brain. This medicine is available by prescription. Ask your caregiver for advice about which medicines to use and how to use them based on your health history.  Your caregiver will tell you what side effects to look out for if you choose to be on a medicine or therapy. Carefully read the information on the package. Do not use any other product containing nicotine while using a nicotine replacement product.  RELAPSE OR DIFFICULT SITUATIONS Most relapses occur within the first 3 months after quitting. Do not be discouraged if you start smoking again. Remember, most people try several times before finally quitting. You may have symptoms of withdrawal because your body is used to nicotine. You may crave cigarettes, be irritable, feel very hungry, cough often, get headaches, or have difficulty concentrating. The withdrawal symptoms are only temporary. They are strongest when you first quit, but they will go away within 10 14 days. To reduce the chances of relapse, try to:  Avoid drinking alcohol. Drinking lowers your chances of successfully quitting.  Reduce the amount of caffeine you consume. Once you quit smoking, the amount of caffeine in your body increases and can give you symptoms, such as a rapid heartbeat, sweating,   and anxiety.  Avoid smokers because they can make you want to smoke.  Do not let weight gain distract you. Many smokers will gain weight when they quit, usually less than 10 pounds. Eat a healthy diet and stay active. You can always lose the weight gained after you quit.  Find ways to improve your mood other than smoking. FOR MORE INFORMATION  www.smokefree.gov  Document Released: 12/11/2001 Document Revised: 06/17/2012 Document Reviewed: 03/27/2012 [employer] Patient Information 2014 [employer].  

## 2013-09-17 NOTE — Progress Notes (Signed)
Patient ID: Grace [name], female   DOB: [birth year], 52 y.o.   MRN: [id number] SUBJECTIVE: CC: Chief Complaint  Patient presents with  . Follow-up    4 MONTH SOME SINUS PROBLEMS     HPI: Patient is here for follow up of hyperlipidemia/HTN/Vit D Deficiency/tobaccouse: denies Headache;denies Chest Pain;denies weakness;denies Shortness of Breath and orthopnea;denies Visual changes;denies palpitations;denies cough;denies pedal edema;denies symptoms of TIA or stroke;deniesClaudication symptoms. admits to Compliance with medications; denies Problems with medications.  Past Medical History  Diagnosis Date  . Hyperlipidemia   . Hypertension   . Vitamin D deficiency   . Tobacco user   . Trigeminal neuralgia    Past Surgical History  Procedure Laterality Date  . Cholecystectomy    . Dilation and curettage of uterus     History   Social History  . Marital Status: Divorced    Spouse Name: N/A    Number of Children: N/A  . Years of Education: N/A   Occupational History  . Not on file.   Social History Main Topics  . Smoking status: Current Every Day Smoker -- 1.00 packs/day    Types: Cigarettes  . Smokeless tobacco: Not on file  . Alcohol Use: No  . Drug Use: No  . Sexual Activity: Not on file   Other Topics Concern  . Not on file   Social History Narrative  . No narrative on file   Family History  Problem Relation Age of Onset  . Hypertension Mother   . Dementia Mother   . Alcohol abuse Mother   . Cancer Father     lung   Current Outpatient Prescriptions on File Prior to Visit  Medication Sig Dispense Refill  . HYDROcodone-acetaminophen (NORCO) 10-325 MG per tablet TAKE ONE TABLET BY MOUTH TWICE DAILY AS NEEDED FOR  TRIGEMINAL  NEURALGIA  60 tablet  0  . medroxyPROGESTERone (DEPO-PROVERA) 150 MG/ML injection Inject 150 mg into the muscle every 3 (three) months.       No current facility-administered medications on file prior to visit.   Allergies  Allergen  Reactions  . Prednisone     REACTION: face reddened, skin hot to the touch, increased BP  . Statins     Muscle aches  . Tricor [Fenofibrate]     Muscle aches    There is no immunization history on file for this patient. Prior to Admission medications   Medication Sig Start Date End Date Taking? Authorizing Provider  amLODipine (NORVASC) 10 MG tablet TAKE 1 TABLET BY MOUTH EVERY DAY 09/17/13   Ileana Ladd, MD  HYDROcodone-acetaminophen (NORCO) 10-325 MG per tablet TAKE ONE TABLET BY MOUTH TWICE DAILY AS NEEDED FOR  TRIGEMINAL  NEURALGIA 07/21/13   Coralie Keens, FNP  losartan (COZAAR) 25 MG tablet TAKE 1 TABLET BY MOUTH EVERY DAY 09/17/13   Ileana Ladd, MD  medroxyPROGESTERone (DEPO-PROVERA) 150 MG/ML injection Inject 150 mg into the muscle every 3 (three) months.    Historical Provider, MD      ROS: As above in the HPI. All other systems are stable or negative.  OBJECTIVE: APPEARANCE:  Patient in no acute distress.The patient appeared well nourished and normally developed. Acyanotic. Waist: VITAL SIGNS:BP 124/68  Pulse 96  Temp(Src) 99.7 F (37.6 C) (Oral)  Ht 5\' 4"  (1.626 m)  Wt 177 lb (80.287 kg)  BMI 30.37 kg/m2 WF obese.  SKIN: warm and  Dry without overt rashes, tattoos and scars  HEAD and Neck: without JVD, Head and scalp:  normal Eyes:No scleral icterus. Fundi normal, eye movements normal. Ears: Auricle normal, canal normal, Tympanic membranes normal, insufflation normal. Nose: nasal congestion. Throat: normal Neck & thyroid: normal  CHEST & LUNGS: Chest wall: normal Lungs: Clear  CVS: Reveals the PMI to be normally located. Regular rhythm, First and Second Heart sounds are normal,  absence of murmurs, rubs or gallops. Peripheral vasculature: Radial pulses: normal Dorsal pedis pulses: normal Posterior pulses: normal  ABDOMEN:  Appearance: normal Benign, no organomegaly, no masses, no Abdominal Aortic enlargement. No Guarding , no rebound. No  Bruits. Bowel sounds: normal  RECTAL: N/A GU: N/A  EXTREMETIES: nonedematous.  MUSCULOSKELETAL:  Spine: normal Joints: intact  NEUROLOGIC: oriented to time,place and person; nonfocal. Strength is normal Sensory is normal Reflexes are normal Cranial Nerves are normal.  ASSESSMENT: HLD (hyperlipidemia) - Plan: CMP14+EGFR, Lipid panel  HTN (hypertension) - Plan: losartan (COZAAR) 25 MG tablet, amLODipine (NORVASC) 10 MG tablet, CMP14+EGFR  Tobacco user  Trigeminal neuralgia  Unspecified vitamin D deficiency - Plan: Vit D  25 hydroxy (rtn osteoporosis monitoring)  Suspect mild URI vs mild seasonal allergic rhinitis. PLAN: Orders Placed This Encounter  Procedures  . CMP14+EGFR    Standing Status: Future     Number of Occurrences:      Standing Expiration Date: 09/17/2014    Order Specific Question:  Has the patient fasted?    Answer:  Yes  . Lipid panel    Standing Status: Future     Number of Occurrences:      Standing Expiration Date: 09/17/2014    Order Specific Question:  Has the patient fasted?    Answer:  Yes  . Vit D  25 hydroxy (rtn osteoporosis monitoring)    Standing Status: Future     Number of Occurrences:      Standing Expiration Date: 09/17/2014   Meds ordered this encounter  Medications  . losartan (COZAAR) 25 MG tablet    Sig: TAKE 1 TABLET BY MOUTH EVERY DAY    Dispense:  90 tablet    Refill:  3  . amLODipine (NORVASC) 10 MG tablet    Sig: TAKE 1 TABLET BY MOUTH EVERY DAY    Dispense:  90 tablet    Refill:  3        Dr Woodroe Mode Recommendations  For nutrition information, I recommend books:  1).Eat to Live by Dr Monico Hoar. 2).Prevent and Reverse Heart Disease by Dr Suzzette Righter. 3) Dr Katherina Right Book:  Program to Reverse Diabetes  Exercise recommendations are:  If unable to walk, then the patient can exercise in a chair 3 times a day. By flapping arms like a bird gently and raising legs outwards to the front.  If  ambulatory, the patient can go for walks for 30 minutes 3 times a week. Then increase the intensity and duration as tolerated.  Goal is to try to attain exercise frequency to 5 times a week.  If applicable: Best to perform resistance exercises (machines or weights) 2 days a week and cardio type exercises 3 days per week.  Smoking cessation counslling. 7 minutes. Handout in the AVS.  Return in about 2 weeks (around 10/01/2013) for for labs and 4 months to see dr [place] Charon.  Niasha Devins P. [place] Charon, M.D.

## 2013-10-12 ENCOUNTER — Telehealth: Payer: Self-pay | Admitting: Family Medicine

## 2013-10-12 NOTE — Telephone Encounter (Signed)
Called about a script for Hydrocodone 10/325  Call patient when ready

## 2013-10-13 ENCOUNTER — Other Ambulatory Visit: Payer: Self-pay | Admitting: Family Medicine

## 2013-10-13 DIAGNOSIS — G500 Trigeminal neuralgia: Secondary | ICD-10-CM

## 2013-10-13 MED ORDER — HYDROCODONE-ACETAMINOPHEN 10-325 MG PO TABS
ORAL_TABLET | ORAL | Status: DC
Start: 2013-10-13 — End: 2013-11-25

## 2013-10-13 NOTE — Telephone Encounter (Signed)
Pt notified  Of rx hydrocodone ready for pick up

## 2013-10-13 NOTE — Telephone Encounter (Signed)
Rx ready for pick up. 

## 2013-10-21 ENCOUNTER — Ambulatory Visit: Payer: BLUE CROSS/BLUE SHIELD

## 2013-10-23 ENCOUNTER — Ambulatory Visit (INDEPENDENT_AMBULATORY_CARE_PROVIDER_SITE_OTHER): Payer: BLUE CROSS/BLUE SHIELD | Admitting: *Deleted

## 2013-10-23 DIAGNOSIS — Z23 Encounter for immunization: Secondary | ICD-10-CM

## 2013-11-23 ENCOUNTER — Other Ambulatory Visit: Payer: Self-pay | Admitting: Family Medicine

## 2013-11-23 ENCOUNTER — Telehealth: Payer: Self-pay | Admitting: Family Medicine

## 2013-11-25 ENCOUNTER — Other Ambulatory Visit: Payer: Self-pay | Admitting: Family Medicine

## 2013-11-25 DIAGNOSIS — G500 Trigeminal neuralgia: Secondary | ICD-10-CM

## 2013-11-25 MED ORDER — HYDROCODONE-ACETAMINOPHEN 10-325 MG PO TABS: ORAL_TABLET | ORAL | Status: DC

## 2013-11-25 NOTE — Telephone Encounter (Signed)
Pt aware to pick up rx for hydrocodone at front desk 

## 2014-01-06 ENCOUNTER — Telehealth: Payer: Self-pay | Admitting: Family Medicine

## 2014-01-10 ENCOUNTER — Other Ambulatory Visit: Payer: Self-pay | Admitting: Family Medicine

## 2014-01-10 DIAGNOSIS — G500 Trigeminal neuralgia: Secondary | ICD-10-CM

## 2014-01-10 MED ORDER — HYDROCODONE-ACETAMINOPHEN 10-325 MG PO TABS: ORAL_TABLET | ORAL | Status: DC

## 2014-01-10 NOTE — Telephone Encounter (Signed)
Rx ready for pick up. 

## 2014-01-11 NOTE — Telephone Encounter (Signed)
Pt aware rx ready for pick up. 

## 2014-01-15 ENCOUNTER — Ambulatory Visit: Payer: BLUE CROSS/BLUE SHIELD | Admitting: Family Medicine

## 2014-02-11 ENCOUNTER — Ambulatory Visit (INDEPENDENT_AMBULATORY_CARE_PROVIDER_SITE_OTHER): Payer: BC Managed Care – PPO | Admitting: Family Medicine

## 2014-02-11 ENCOUNTER — Encounter: Payer: Self-pay | Admitting: Family Medicine

## 2014-02-11 VITALS — BP 124/67 | HR 88 | Temp 99.8°F | Ht 64.0 in | Wt 176.8 lb

## 2014-02-11 DIAGNOSIS — E785 Hyperlipidemia, unspecified: Secondary | ICD-10-CM

## 2014-02-11 DIAGNOSIS — F17200 Nicotine dependence, unspecified, uncomplicated: Secondary | ICD-10-CM

## 2014-02-11 DIAGNOSIS — I10 Essential (primary) hypertension: Secondary | ICD-10-CM

## 2014-02-11 DIAGNOSIS — G500 Trigeminal neuralgia: Secondary | ICD-10-CM

## 2014-02-11 DIAGNOSIS — E559 Vitamin D deficiency, unspecified: Secondary | ICD-10-CM

## 2014-02-11 DIAGNOSIS — Z720 Tobacco use: Secondary | ICD-10-CM

## 2014-02-11 MED ORDER — HYDROCODONE-ACETAMINOPHEN 10-325 MG PO TABS: ORAL_TABLET | ORAL | Status: DC

## 2014-02-11 NOTE — Progress Notes (Signed)
Patient ID: Grace [name], female   DOB: 1961-08-20, 53 y.o.   MRN: [id number] SUBJECTIVE: CC: Chief Complaint  Patient presents with  . Follow-up    4 month follow up     HPI: Patient is here for follow up of hyperlipidemia/HTN/Vit D Def/Tobacco user/Trigeminal neuralgia: denies Headache;denies Chest Pain;denies weakness;denies Shortness of Breath and orthopnea;denies Visual changes;denies palpitations;denies cough;denies pedal edema;denies symptoms of TIA or stroke;deniesClaudication symptoms. admits to Compliance with medications; denies Problems with medications. Continues to smoke. Addicted and for 23 years even her OB/Gyn has counseled her to stop smoking. Her dad smoked and  Died of lung cancer and she still has no desire to stop.  Past Medical History  Diagnosis Date  . Hyperlipidemia   . Hypertension   . Vitamin D deficiency   . Tobacco user   . Trigeminal neuralgia    Past Surgical History  Procedure Laterality Date  . Cholecystectomy    . Dilation and curettage of uterus     History   Social History  . Marital Status: Divorced    Spouse Name: N/A    Number of Children: N/A  . Years of Education: N/A   Occupational History  . Not on file.   Social History Main Topics  . Smoking status: Current Every Day Smoker -- 1.00 packs/day    Types: Cigarettes  . Smokeless tobacco: Not on file  . Alcohol Use: No  . Drug Use: No  . Sexual Activity: Not on file   Other Topics Concern  . Not on file   Social History Narrative  . No narrative on file   Family History  Problem Relation Age of Onset  . Hypertension Mother   . Dementia Mother   . Alcohol abuse Mother   . Cancer Father     lung   Current Outpatient Prescriptions on File Prior to Visit  Medication Sig Dispense Refill  . amLODipine (NORVASC) 10 MG tablet TAKE 1 TABLET BY MOUTH EVERY DAY  90 tablet  3  . losartan (COZAAR) 25 MG tablet TAKE 1 TABLET BY MOUTH EVERY DAY  90 tablet  3  .  medroxyPROGESTERone (DEPO-PROVERA) 150 MG/ML injection Inject 150 mg into the muscle every 3 (three) months.       No current facility-administered medications on file prior to visit.   Allergies  Allergen Reactions  . Prednisone     REACTION: face reddened, skin hot to the touch, increased BP  . Statins     Muscle aches  . Tricor [Fenofibrate]     Muscle aches   Immunization History  Administered Date(s) Administered  . Influenza,inj,Quad PF,36+ Mos 10/23/2013   Prior to Admission medications   Medication Sig Start Date End Date Taking? Authorizing Provider  amLODipine (NORVASC) 10 MG tablet TAKE 1 TABLET BY MOUTH EVERY DAY 09/17/13  Yes Vernie Shanks, MD  HYDROcodone-acetaminophen (NORCO) 10-325 MG per tablet TAKE ONE TABLET BY MOUTH TWICE DAILY AS NEEDED FOR  TRIGEMINAL  NEURALGIA 01/10/14  Yes Vernie Shanks, MD  losartan (COZAAR) 25 MG tablet TAKE 1 TABLET BY MOUTH EVERY DAY 09/17/13  Yes Vernie Shanks, MD  medroxyPROGESTERone (DEPO-PROVERA) 150 MG/ML injection Inject 150 mg into the muscle every 3 (three) months.   Yes Historical Provider, MD     ROS: As above in the HPI. All other systems are stable or negative.  OBJECTIVE: APPEARANCE:  Patient in no acute distress.The patient appeared well nourished and normally developed. Acyanotic. Waist: VITAL SIGNS:BP 124/67  Pulse 88  Temp(Src) 99.8 F (37.7 C) (Oral)  Ht 5' 4"  (1.626 m)  Wt 176 lb 12.8 oz (80.196 kg)  BMI 30.33 kg/m2  WF SKIN: warm and  Dry without overt rashes, tattoos and scars  HEAD and Neck: without JVD, Head and scalp: normal Eyes:No scleral icterus. Fundi normal, eye movements normal. Ears: Auricle normal, canal normal, Tympanic membranes normal, insufflation normal. Nose: normal Throat: normal Neck & thyroid: normal  CHEST & LUNGS: Chest wall: normal Lungs: Clear  CVS: Reveals the PMI to be normally located. Regular rhythm, First and Second Heart sounds are normal,  absence of murmurs,  rubs or gallops. Peripheral vasculature: Radial pulses: normal Dorsal pedis pulses: normal Posterior pulses: normal  ABDOMEN:  Appearance: normal Benign, no organomegaly, no masses, no Abdominal Aortic enlargement. No Guarding , no rebound. No Bruits. Bowel sounds: normal  RECTAL: N/A GU: N/A  EXTREMETIES: nonedematous.  MUSCULOSKELETAL:  Spine: normal Joints: intact  NEUROLOGIC: oriented to time,place and person; nonfocal. Strength is normal Sensory is normal Reflexes are normal Cranial Nerves are normal.  ASSESSMENT: Trigeminal neuralgia - Plan: HYDROcodone-acetaminophen (NORCO) 10-325 MG per tablet, Vitamin B12  Vitamin D deficiency - Plan: Vit D  25 hydroxy (rtn osteoporosis monitoring), Vitamin B12  Hyperlipidemia - Plan: CMP14+EGFR, Vitamin B12, CANCELED: NMR, lipoprofile  Hypertension - Plan: CMP14+EGFR, Vitamin B12  Tobacco user  PLAN:      Dr Paula Libra Recommendations  For nutrition information, I recommend books:  1).Eat to Live by Dr Excell Seltzer. 2).Prevent and Reverse Heart Disease by Dr Karl Luke. 3) Dr Janene Harvey Book:  Program to Reverse Diabetes  Exercise recommendations are:  If unable to walk, then the patient can exercise in a chair 3 times a day. By flapping arms like a bird gently and raising legs outwards to the front.  If ambulatory, the patient can go for walks for 30 minutes 3 times a week. Then increase the intensity and duration as tolerated.  Goal is to try to attain exercise frequency to 5 times a week.  If applicable: Best to perform resistance exercises (machines or weights) 2 days a week and cardio type exercises 3 days per week.   Smoking cessation in the AVS.  counselled for 7 minutes.  Orders Placed This Encounter  Procedures  . CMP14+EGFR  . Vit D  25 hydroxy (rtn osteoporosis monitoring)  . Vitamin B12   Meds ordered this encounter  Medications  . aspirin EC 81 MG tablet    Sig: Take 81 mg  by mouth daily.  . Cholecalciferol (VITAMIN D) 2000 UNITS CAPS    Sig: Take 2,000 Units by mouth.  Marland Kitchen HYDROcodone-acetaminophen (NORCO) 10-325 MG per tablet    Sig: TAKE ONE TABLET BY MOUTH TWICE DAILY AS NEEDED FOR  TRIGEMINAL  NEURALGIA    Dispense:  60 tablet    Refill:  0   Medications Discontinued During This Encounter  Medication Reason  . HYDROcodone-acetaminophen (NORCO) 10-325 MG per tablet Reorder   Return in about 4 months (around 06/11/2014) for Recheck medical problems.  Ivah Girardot P. Jacelyn Grip, M.D.

## 2014-02-11 NOTE — Patient Instructions (Addendum)
    Dr Cheyanna Strick's Recommendations  For nutrition information, I recommend books:  1).Eat to Live by Dr Joel Fuhrman. 2).Prevent and Reverse Heart Disease by Dr Caldwell Esselstyn. 3) Dr Neal Barnard's Book:  Program to Reverse Diabetes  Exercise recommendations are:  If unable to walk, then the patient can exercise in a chair 3 times a day. By flapping arms like a bird gently and raising legs outwards to the front.  If ambulatory, the patient can go for walks for 30 minutes 3 times a week. Then increase the intensity and duration as tolerated.  Goal is to try to attain exercise frequency to 5 times a week.  If applicable: Best to perform resistance exercises (machines or weights) 2 days a week and cardio type exercises 3 days per week.   Smoking Cessation Quitting smoking is important to your health and has many advantages. However, it is not always easy to quit since nicotine is a very addictive drug. Often times, people try 3 times or more before being able to quit. This document explains the best ways for you to prepare to quit smoking. Quitting takes hard work and a lot of effort, but you can do it. ADVANTAGES OF QUITTING SMOKING  You will live longer, feel better, and live better.  Your body will feel the impact of quitting smoking almost immediately.  Within 20 minutes, blood pressure decreases. Your pulse returns to its normal level.  After 8 hours, carbon monoxide levels in the blood return to normal. Your oxygen level increases.  After 24 hours, the chance of having a heart attack starts to decrease. Your breath, hair, and body stop smelling like smoke.  After 48 hours, damaged nerve endings begin to recover. Your sense of taste and smell improve.  After 72 hours, the body is virtually free of nicotine. Your bronchial tubes relax and breathing becomes easier.  After 2 to 12 weeks, lungs can hold more air. Exercise becomes easier and circulation improves.  The risk  of having a heart attack, stroke, cancer, or lung disease is greatly reduced.  After 1 year, the risk of coronary heart disease is cut in half.  After 5 years, the risk of stroke falls to the same as a nonsmoker.  After 10 years, the risk of lung cancer is cut in half and the risk of other cancers decreases significantly.  After 15 years, the risk of coronary heart disease drops, usually to the level of a nonsmoker.  If you are pregnant, quitting smoking will improve your chances of having a healthy baby.  The people you live with, especially any children, will be healthier.  You will have extra money to spend on things other than cigarettes. QUESTIONS TO THINK ABOUT BEFORE ATTEMPTING TO QUIT You may want to talk about your answers with your caregiver.  Why do you want to quit?  If you tried to quit in the past, what helped and what did not?  What will be the most difficult situations for you after you quit? How will you plan to handle them?  Who can help you through the tough times? Your family? Friends? A caregiver?  What pleasures do you get from smoking? What ways can you still get pleasure if you quit? Here are some questions to ask your caregiver:  How can you help me to be successful at quitting?  What medicine do you think would be best for me and how should I take it?  What should I   do if I need more help?  What is smoking withdrawal like? How can I get information on withdrawal? GET READY  Set a quit date.  Change your environment by getting rid of all cigarettes, ashtrays, matches, and lighters in your home, car, or work. Do not let people smoke in your home.  Review your past attempts to quit. Think about what worked and what did not. GET SUPPORT AND ENCOURAGEMENT You have a better chance of being successful if you have help. You can get support in many ways.  Tell your family, friends, and co-workers that you are going to quit and need their support. Ask  them not to smoke around you.  Get individual, group, or telephone counseling and support. Programs are available at local hospitals and health centers. Call your local health department for information about programs in your area.  Spiritual beliefs and practices may help some smokers quit.  Download a "quit meter" on your computer to keep track of quit statistics, such as how long you have gone without smoking, cigarettes not smoked, and money saved.  Get a self-help book about quitting smoking and staying off of tobacco. LEARN NEW SKILLS AND BEHAVIORS  Distract yourself from urges to smoke. Talk to someone, go for a walk, or occupy your time with a task.  Change your normal routine. Take a different route to work. Drink tea instead of coffee. Eat breakfast in a different place.  Reduce your stress. Take a hot bath, exercise, or read a book.  Plan something enjoyable to do every day. Reward yourself for not smoking.  Explore interactive web-based programs that specialize in helping you quit. GET MEDICINE AND USE IT CORRECTLY Medicines can help you stop smoking and decrease the urge to smoke. Combining medicine with the above behavioral methods and support can greatly increase your chances of successfully quitting smoking.  Nicotine replacement therapy helps deliver nicotine to your body without the negative effects and risks of smoking. Nicotine replacement therapy includes nicotine gum, lozenges, inhalers, nasal sprays, and skin patches. Some may be available over-the-counter and others require a prescription.  Antidepressant medicine helps people abstain from smoking, but how this works is unknown. This medicine is available by prescription.  Nicotinic receptor partial agonist medicine simulates the effect of nicotine in your brain. This medicine is available by prescription. Ask your caregiver for advice about which medicines to use and how to use them based on your health history.  Your caregiver will tell you what side effects to look out for if you choose to be on a medicine or therapy. Carefully read the information on the package. Do not use any other product containing nicotine while using a nicotine replacement product.  RELAPSE OR DIFFICULT SITUATIONS Most relapses occur within the first 3 months after quitting. Do not be discouraged if you start smoking again. Remember, most people try several times before finally quitting. You may have symptoms of withdrawal because your body is used to nicotine. You may crave cigarettes, be irritable, feel very hungry, cough often, get headaches, or have difficulty concentrating. The withdrawal symptoms are only temporary. They are strongest when you first quit, but they will go away within 10 14 days. To reduce the chances of relapse, try to:  Avoid drinking alcohol. Drinking lowers your chances of successfully quitting.  Reduce the amount of caffeine you consume. Once you quit smoking, the amount of caffeine in your body increases and can give you symptoms, such as a rapid heartbeat, sweating,   and anxiety.  Avoid smokers because they can make you want to smoke.  Do not let weight gain distract you. Many smokers will gain weight when they quit, usually less than 10 pounds. Eat a healthy diet and stay active. You can always lose the weight gained after you quit.  Find ways to improve your mood other than smoking. FOR MORE INFORMATION  www.smokefree.gov  Document Released: 12/11/2001 Document Revised: 06/17/2012 Document Reviewed: 03/27/2012 [employer] Patient Information 2014 [employer].  

## 2014-02-12 LAB — CMP14+EGFR
ALT: 13 IU/L (ref 0–32)
AST: 14 IU/L (ref 0–40)
Albumin/Globulin Ratio: 1.5 (ref 1.1–2.5)
Albumin: 4.3 g/dL (ref 3.5–5.5)
Alkaline Phosphatase: 62 IU/L (ref 39–117)
BUN/Creatinine Ratio: 15 (ref 9–23)
BUN: 13 mg/dL (ref 6–24)
CO2: 23 mmol/L (ref 18–29)
Calcium: 9.3 mg/dL (ref 8.7–10.2)
Chloride: 103 mmol/L (ref 97–108)
Creatinine, Ser: 0.85 mg/dL (ref 0.57–1.00)
GFR calc Af Amer: 90 mL/min/{1.73_m2} (ref >59)
GFR calc non Af Amer: 78 mL/min/{1.73_m2} (ref >59)
Globulin, Total: 2.8 g/dL (ref 1.5–4.5)
Glucose: 87 mg/dL (ref 65–99)
Potassium: 4.4 mmol/L (ref 3.5–5.2)
Sodium: 139 mmol/L (ref 134–144)
Total Bilirubin: 0.2 mg/dL (ref 0.0–1.2)
Total Protein: 7.1 g/dL (ref 6.0–8.5)

## 2014-02-12 LAB — VITAMIN B12: Vitamin B-12: 310 pg/mL (ref 211–946)

## 2014-02-12 LAB — VITAMIN D 25 HYDROXY (VIT D DEFICIENCY, FRACTURES): Vit D, 25-Hydroxy: 26.3 ng/mL — ABNORMAL LOW (ref 30.0–100.0)

## 2014-03-19 ENCOUNTER — Telehealth: Payer: Self-pay | Admitting: Family Medicine

## 2014-03-19 ENCOUNTER — Other Ambulatory Visit: Payer: Self-pay | Admitting: Family Medicine

## 2014-03-19 DIAGNOSIS — G500 Trigeminal neuralgia: Secondary | ICD-10-CM

## 2014-03-19 MED ORDER — HYDROCODONE-ACETAMINOPHEN 10-325 MG PO TABS: ORAL_TABLET | ORAL | Status: DC

## 2014-03-19 NOTE — Telephone Encounter (Signed)
Rx ready for pick up. 

## 2014-03-22 NOTE — Telephone Encounter (Signed)
Patient aware.

## 2014-03-26 ENCOUNTER — Other Ambulatory Visit: Payer: Self-pay | Admitting: Family Medicine

## 2014-03-26 DIAGNOSIS — G500 Trigeminal neuralgia: Secondary | ICD-10-CM

## 2014-03-26 MED ORDER — HYDROCODONE-ACETAMINOPHEN 10-325 MG PO TABS: ORAL_TABLET | ORAL | Status: DC

## 2014-04-30 ENCOUNTER — Telehealth: Payer: Self-pay | Admitting: Family Medicine

## 2014-04-30 ENCOUNTER — Other Ambulatory Visit: Payer: Self-pay | Admitting: Family Medicine

## 2014-04-30 DIAGNOSIS — G500 Trigeminal neuralgia: Secondary | ICD-10-CM

## 2014-04-30 MED ORDER — HYDROCODONE-ACETAMINOPHEN 10-325 MG PO TABS: ORAL_TABLET | ORAL | Status: DC

## 2014-04-30 NOTE — Telephone Encounter (Signed)
Patient aware.

## 2014-04-30 NOTE — Telephone Encounter (Signed)
Rx ready for pick up. 

## 2014-06-07 ENCOUNTER — Telehealth: Payer: Self-pay | Admitting: Family Medicine

## 2014-06-07 DIAGNOSIS — G500 Trigeminal neuralgia: Secondary | ICD-10-CM

## 2014-06-07 NOTE — Telephone Encounter (Signed)
Has appt with you 07/05/14, last filled 04/30/14. Last seen [place] Charon 02/11/14 for trigeminal neuralgia

## 2014-06-08 ENCOUNTER — Other Ambulatory Visit: Payer: Self-pay | Admitting: Family Medicine

## 2014-06-08 DIAGNOSIS — G500 Trigeminal neuralgia: Secondary | ICD-10-CM

## 2014-06-08 MED ORDER — HYDROCODONE-ACETAMINOPHEN 10-325 MG PO TABS: ORAL_TABLET | ORAL | Status: DC

## 2014-06-09 ENCOUNTER — Other Ambulatory Visit: Payer: Self-pay | Admitting: Family Medicine

## 2014-06-09 DIAGNOSIS — G500 Trigeminal neuralgia: Secondary | ICD-10-CM

## 2014-06-09 MED ORDER — HYDROCODONE-ACETAMINOPHEN 10-325 MG PO TABS: ORAL_TABLET | ORAL | Status: DC

## 2014-07-05 ENCOUNTER — Ambulatory Visit: Payer: BC Managed Care – PPO | Admitting: Family Medicine

## 2014-07-27 ENCOUNTER — Ambulatory Visit (INDEPENDENT_AMBULATORY_CARE_PROVIDER_SITE_OTHER): Payer: BC Managed Care – PPO | Admitting: Family

## 2014-07-27 ENCOUNTER — Encounter: Payer: Self-pay | Admitting: Family

## 2014-07-27 ENCOUNTER — Ambulatory Visit (INDEPENDENT_AMBULATORY_CARE_PROVIDER_SITE_OTHER): Payer: BC Managed Care – PPO

## 2014-07-27 VITALS — BP 151/71 | HR 105 | Temp 98.8°F | Ht 64.0 in | Wt 178.8 lb

## 2014-07-27 DIAGNOSIS — M25572 Pain in left ankle and joints of left foot: Secondary | ICD-10-CM

## 2014-07-27 DIAGNOSIS — G500 Trigeminal neuralgia: Secondary | ICD-10-CM

## 2014-07-27 DIAGNOSIS — S93409A Sprain of unspecified ligament of unspecified ankle, initial encounter: Secondary | ICD-10-CM

## 2014-07-27 DIAGNOSIS — M25579 Pain in unspecified ankle and joints of unspecified foot: Secondary | ICD-10-CM

## 2014-07-27 MED ORDER — HYDROCODONE-ACETAMINOPHEN 10-325 MG PO TABS: ORAL_TABLET | ORAL | Status: DC

## 2014-07-27 NOTE — Patient Instructions (Signed)

## 2014-07-27 NOTE — Progress Notes (Signed)
   Subjective:    Patient ID: Grace [name], female    DOB: 06/09/1961, 53 y.o.   MRN: [id number]  Ankle Pain  The incident occurred 5 to 7 days ago (Pt fell Friday). The incident occurred at work. The injury mechanism was a twisting injury. The pain is present in the left ankle. The quality of the pain is described as aching. The pain is at a severity of 5/10. The pain is mild. The pain has been fluctuating since onset. Associated symptoms include an inability to bear weight. Pertinent negatives include no numbness or tingling. She reports no foreign bodies present. The symptoms are aggravated by weight bearing. She has tried acetaminophen, ice, NSAIDs and rest for the symptoms. The treatment provided moderate relief.      Review of Systems  Constitutional: Negative.   HENT: Negative.   Eyes: Negative.   Respiratory: Negative.  Negative for shortness of breath.   Cardiovascular: Negative.  Negative for palpitations.  Gastrointestinal: Negative.   Endocrine: Negative.   Genitourinary: Negative.   Musculoskeletal: Positive for joint swelling.  Neurological: Negative.  Negative for tingling, numbness and headaches.  Hematological: Negative.   Psychiatric/Behavioral: Negative.   All other systems reviewed and are negative.      Objective:   Physical Exam  Vitals reviewed. Constitutional: She is oriented to person, place, and time. She appears well-developed and well-nourished. No distress.  HENT:  Head: Normocephalic and atraumatic.  Right Ear: External ear normal.  Mouth/Throat: Oropharynx is clear and moist.  Eyes: Pupils are equal, round, and reactive to light.  Neck: Normal range of motion. Neck supple. No thyromegaly present.  Cardiovascular: Normal rate, regular rhythm, normal heart sounds and intact distal pulses.   No murmur heard. Pulmonary/Chest: Effort normal and breath sounds normal. No respiratory distress. She has no wheezes.  Abdominal: Soft. Bowel sounds are  normal. She exhibits no distension. There is no tenderness.  Musculoskeletal: Normal range of motion. She exhibits edema and tenderness.  Decrease ROM of left ankle r/t to swelling and pain   Neurological: She is alert and oriented to person, place, and time. She has normal reflexes. No cranial nerve deficit.  Skin: Skin is warm and dry.  Psychiatric: She has a normal mood and affect. Her behavior is normal. Judgment and thought content normal.    X-Ray- No fractures seen Preliminary reading by Jannifer Rodneyhristy Polk Minor, FNP [facility]  BP 151/71  Pulse 105  Temp(Src) 98.8 F (37.1 C) (Oral)  Ht 5\' 4"  (1.626 m)  Wt 178 lb 12.8 oz (81.103 kg)  BMI 30.68 kg/m2       Assessment & Plan:  1. Pain in joint, ankle and foot, left - DG Ankle Complete Left; Future  2. Trigeminal neuralgia - HYDROcodone-acetaminophen (NORCO) 10-325 MG per tablet; TAKE ONE TABLET BY MOUTH TWICE DAILY AS NEEDED FOR  TRIGEMINAL  NEURALGIA  Dispense: 60 tablet; Refill: 0  3. Sprain of ankle, unspecified site -Rest -Ice -Elevate feet when sitting  RTO in 4 months for chronic follow-up  Jannifer Rodneyhristy Dollene Mallery, FNP

## 2014-08-24 ENCOUNTER — Telehealth: Payer: Self-pay | Admitting: Family

## 2014-08-24 DIAGNOSIS — G500 Trigeminal neuralgia: Secondary | ICD-10-CM

## 2014-08-25 MED ORDER — HYDROCODONE-ACETAMINOPHEN 10-325 MG PO TABS: ORAL_TABLET | ORAL | Status: DC

## 2014-08-25 NOTE — Telephone Encounter (Signed)
Pt aware rx ready to be picked up °

## 2014-08-25 NOTE — Telephone Encounter (Signed)
Pain medication reordered

## 2014-09-08 ENCOUNTER — Encounter: Payer: Self-pay | Admitting: Gastroenterology

## 2014-09-21 ENCOUNTER — Telehealth: Payer: Self-pay | Admitting: Family

## 2014-09-21 DIAGNOSIS — G500 Trigeminal neuralgia: Secondary | ICD-10-CM

## 2014-09-21 MED ORDER — HYDROCODONE-ACETAMINOPHEN 10-325 MG PO TABS: ORAL_TABLET | ORAL | Status: DC

## 2014-09-21 NOTE — Telephone Encounter (Signed)
Christy please address this

## 2014-09-22 ENCOUNTER — Encounter: Payer: Self-pay | Admitting: *Deleted

## 2014-09-22 NOTE — Telephone Encounter (Signed)
Aware ,script ready. 

## 2014-10-06 ENCOUNTER — Ambulatory Visit: Payer: BC Managed Care – PPO

## 2014-10-20 ENCOUNTER — Telehealth: Payer: Self-pay | Admitting: Family Medicine

## 2014-10-22 ENCOUNTER — Other Ambulatory Visit: Payer: Self-pay | Admitting: Family Medicine

## 2014-10-22 DIAGNOSIS — G500 Trigeminal neuralgia: Secondary | ICD-10-CM

## 2014-10-22 MED ORDER — HYDROCODONE-ACETAMINOPHEN 10-325 MG PO TABS: ORAL_TABLET | ORAL | Status: DC

## 2014-10-22 MED ORDER — HYDROCODONE-ACETAMINOPHEN 10-325 MG PO TABS
ORAL_TABLET | ORAL | Status: DC
Start: 2014-10-22 — End: 2014-10-22

## 2014-10-22 NOTE — Addendum Note (Signed)
Addended by: Tamera PuntWRAY, WENDY S on: 10/22/2014 05:16 PM   Modules accepted: Orders

## 2014-10-25 NOTE — Telephone Encounter (Signed)
Rx done on 10-22-14

## 2014-11-19 ENCOUNTER — Other Ambulatory Visit: Payer: Self-pay | Admitting: Family Medicine

## 2014-11-19 ENCOUNTER — Ambulatory Visit: Payer: BC Managed Care – PPO | Admitting: Family Medicine

## 2014-11-19 ENCOUNTER — Telehealth: Payer: Self-pay | Admitting: *Deleted

## 2014-11-19 DIAGNOSIS — G500 Trigeminal neuralgia: Secondary | ICD-10-CM

## 2014-11-19 MED ORDER — HYDROCODONE-ACETAMINOPHEN 10-325 MG PO TABS: ORAL_TABLET | ORAL | Status: DC

## 2014-11-19 NOTE — Telephone Encounter (Signed)
[facility] patient

## 2014-11-19 NOTE — Telephone Encounter (Signed)
Last seen 07/27/14 by Neysa Bonitohristy, last filled 10/22/14

## 2014-11-19 NOTE — Telephone Encounter (Signed)
Pt notified RX is ready for pick up RX to front  

## 2014-11-24 ENCOUNTER — Ambulatory Visit: Payer: BC Managed Care – PPO | Admitting: Family Medicine

## 2014-11-30 ENCOUNTER — Ambulatory Visit (INDEPENDENT_AMBULATORY_CARE_PROVIDER_SITE_OTHER): Payer: BC Managed Care – PPO | Admitting: Family Medicine

## 2014-11-30 VITALS — BP 116/64 | HR 90 | Temp 98.3°F | Ht 64.0 in | Wt 181.6 lb

## 2014-11-30 DIAGNOSIS — I10 Essential (primary) hypertension: Secondary | ICD-10-CM

## 2014-11-30 DIAGNOSIS — G500 Trigeminal neuralgia: Secondary | ICD-10-CM

## 2014-11-30 LAB — HM MAMMOGRAPHY

## 2014-11-30 MED ORDER — LOSARTAN POTASSIUM 25 MG PO TABS: ORAL_TABLET | ORAL | Status: DC

## 2014-11-30 MED ORDER — AMLODIPINE BESYLATE 10 MG PO TABS: ORAL_TABLET | ORAL | Status: DC

## 2014-11-30 MED ORDER — HYDROCODONE-ACETAMINOPHEN 10-325 MG PO TABS: ORAL_TABLET | ORAL | Status: DC

## 2014-11-30 NOTE — Progress Notes (Signed)
   Subjective:    Patient ID: Grace [name], female    DOB: 11/04/1961, 53 y.o.   MRN: [id number]  HPI Patient is here for refill on hydrocodone and HTN medicine.  She has hx of trigeminal neuralgia and she is taking hydrocodone for pain control.  Review of Systems  Constitutional: Negative for fever.  HENT: Negative for ear pain.   Eyes: Negative for discharge.  Respiratory: Negative for cough.   Cardiovascular: Negative for chest pain.  Gastrointestinal: Negative for abdominal distention.  Endocrine: Negative for polyuria.  Genitourinary: Negative for difficulty urinating.  Musculoskeletal: Negative for gait problem and neck pain.  Skin: Negative for color change and rash.  Neurological: Negative for speech difficulty and headaches.  Psychiatric/Behavioral: Negative for agitation.       Objective:    BP 116/64 mmHg  Pulse 90  Temp(Src) 98.3 F (36.8 C) (Oral)  Ht 5\' 4"  (1.626 m)  Wt 181 lb 9.6 oz (82.373 kg)  BMI 31.16 kg/m2 Physical Exam  Constitutional: She is oriented to person, place, and time. She appears well-developed and well-nourished.  HENT:  Head: Normocephalic and atraumatic.  Mouth/Throat: Oropharynx is clear and moist.  Eyes: Pupils are equal, round, and reactive to light.  Neck: Normal range of motion. Neck supple.  Cardiovascular: Normal rate and regular rhythm.   No murmur heard. Pulmonary/Chest: Effort normal and breath sounds normal.  Abdominal: Soft. Bowel sounds are normal. There is no tenderness.  Neurological: She is alert and oriented to person, place, and time.  Skin: Skin is warm and dry.  Psychiatric: She has a normal mood and affect.          Assessment & Plan:     ICD-9-CM ICD-10-CM   1. Essential hypertension 401.9 I10 losartan (COZAAR) 25 MG tablet     amLODipine (NORVASC) 10 MG tablet  2. Trigeminal neuralgia 350.1 G50.0 HYDROcodone-acetaminophen (NORCO) 10-325 MG per tablet     No Follow-up on file.  Deatra CanterWilliam J [facility]  FNP

## 2014-12-16 LAB — HM MAMMOGRAPHY: HM MAMMO: NEGATIVE

## 2015-01-18 ENCOUNTER — Encounter: Payer: Self-pay | Admitting: *Deleted

## 2015-01-19 ENCOUNTER — Telehealth: Payer: Self-pay | Admitting: Family Medicine

## 2015-01-19 ENCOUNTER — Other Ambulatory Visit: Payer: Self-pay | Admitting: Family Medicine

## 2015-01-19 DIAGNOSIS — G500 Trigeminal neuralgia: Secondary | ICD-10-CM

## 2015-01-19 MED ORDER — HYDROCODONE-ACETAMINOPHEN 10-325 MG PO TABS: ORAL_TABLET | ORAL | Status: DC

## 2015-01-20 ENCOUNTER — Telehealth: Payer: Self-pay | Admitting: Family Medicine

## 2015-01-25 ENCOUNTER — Other Ambulatory Visit: Payer: Self-pay | Admitting: Family Medicine

## 2015-02-16 ENCOUNTER — Telehealth: Payer: Self-pay | Admitting: Family Medicine

## 2015-02-16 DIAGNOSIS — G500 Trigeminal neuralgia: Secondary | ICD-10-CM

## 2015-02-16 MED ORDER — HYDROCODONE-ACETAMINOPHEN 10-325 MG PO TABS: ORAL_TABLET | ORAL | Status: DC

## 2015-02-17 ENCOUNTER — Other Ambulatory Visit: Payer: Self-pay | Admitting: *Deleted

## 2015-02-17 NOTE — Telephone Encounter (Signed)
Aware, script for hydrocodone ready for pick up here.

## 2015-02-22 ENCOUNTER — Encounter: Payer: Self-pay | Admitting: Gastroenterology

## 2015-03-23 ENCOUNTER — Other Ambulatory Visit: Payer: Self-pay | Admitting: Family Medicine

## 2015-03-23 DIAGNOSIS — G500 Trigeminal neuralgia: Secondary | ICD-10-CM

## 2015-03-23 MED ORDER — HYDROCODONE-ACETAMINOPHEN 10-325 MG PO TABS: ORAL_TABLET | ORAL | Status: DC

## 2015-03-23 NOTE — Telephone Encounter (Signed)
Last filled 02/16/15, [facility]/30/14. Has appt with Dr. Hyacinth MeekerMiller 04/05/15. Rx will print

## 2015-03-24 NOTE — Telephone Encounter (Signed)
Patient aware rx ready to be picked

## 2015-04-05 ENCOUNTER — Ambulatory Visit (INDEPENDENT_AMBULATORY_CARE_PROVIDER_SITE_OTHER): Payer: 59 | Admitting: Family Medicine

## 2015-04-05 ENCOUNTER — Encounter: Payer: Self-pay | Admitting: Family Medicine

## 2015-04-05 VITALS — BP 114/67 | HR 93 | Temp 98.3°F | Ht 64.0 in | Wt 179.0 lb

## 2015-04-05 DIAGNOSIS — E785 Hyperlipidemia, unspecified: Secondary | ICD-10-CM

## 2015-04-05 DIAGNOSIS — I10 Essential (primary) hypertension: Secondary | ICD-10-CM

## 2015-04-05 NOTE — Progress Notes (Signed)
   Subjective:    Patient ID: Grace [name], female    DOB: 01/08/1961, 54 y.o.   MRN: [id number]  HPI 54 year old female here to recheck her blood pressure. She takes amlodipine and losartan at nighttime. There've been no side effects to medications and she seems compliant.  She also has a history of trigeminal neuralgia and has been on multiple medications as maintenance and preventive meds but stopped these. Now she takes hydrocodone as needed.  We also discussed her lipids when last checked her LDL was in a gray zone and her 10 year cardiac risk profile was less than 10% suggesting that she does not need to be treated.  Patient Active Problem List   Diagnosis Date Noted  . Hyperlipidemia   . Hypertension   . Vitamin D deficiency   . Tobacco user   . HTN (hypertension) 05/15/2013  . Trigeminal neuralgia 05/15/2013  . Unspecified vitamin D deficiency 05/15/2013  . HLD (hyperlipidemia) 05/15/2013   Outpatient Encounter Prescriptions as of 04/05/2015  Medication Sig  . amLODipine (NORVASC) 10 MG tablet TAKE 1 TABLET BY MOUTH EVERY DAY  . aspirin EC 81 MG tablet Take 81 mg by mouth daily.  . Cholecalciferol (VITAMIN D) 2000 UNITS CAPS Take 2,000 Units by mouth.  Marland Kitchen. HYDROcodone-acetaminophen (NORCO) 10-325 MG per tablet TAKE ONE TABLET BY MOUTH TID DAILY AS NEEDED FOR  TRIGEMINAL  NEURALGIA  . losartan (COZAAR) 25 MG tablet TAKE 1 TABLET BY MOUTH EVERY DAY     Review of Systems  Constitutional: Negative.   HENT: Negative.   Eyes: Negative.   Respiratory: Negative.   Cardiovascular: Negative.   Gastrointestinal: Negative.   Endocrine: Negative.   Genitourinary: Negative.   Hematological: Negative.   Psychiatric/Behavioral: Negative.        Objective:   Physical Exam  Constitutional: She is oriented to person, place, and time. She appears well-developed and well-nourished.  Eyes: Conjunctivae and EOM are normal.  Neck: Normal range of motion. Neck supple.  Cardiovascular:  Normal rate, regular rhythm and normal heart sounds.   Pulmonary/Chest: Effort normal and breath sounds normal.  Abdominal: Soft. Bowel sounds are normal.  Musculoskeletal: Normal range of motion.  Neurological: She is alert and oriented to person, place, and time. She has normal reflexes.  Skin: Skin is warm and dry.  Psychiatric: She has a normal mood and affect. Her behavior is normal. Thought content normal.    BP 114/67 mmHg  Pulse 93  Temp(Src) 98.3 F (36.8 C) (Oral)  Ht 5\' 4"  (1.626 m)  Wt 179 lb (81.194 kg)  BMI 30.71 kg/m2       Assessment & Plan:  1. Essential hypertension Blood pressure well controlled on current regimen continue same  2. HLD (hyperlipidemia) As stated above her cardiac risk profile does not indicate need for treatment at this time. Probably need to repeat her lipids at next visit in 6 months  Frederica KusterStephen M Miller MD

## 2015-04-07 ENCOUNTER — Ambulatory Visit: Payer: BC Managed Care – PPO | Admitting: Family Medicine

## 2015-04-25 ENCOUNTER — Telehealth: Payer: Self-pay | Admitting: Family Medicine

## 2015-04-25 DIAGNOSIS — G500 Trigeminal neuralgia: Secondary | ICD-10-CM

## 2015-04-25 MED ORDER — HYDROCODONE-ACETAMINOPHEN 10-325 MG PO TABS: ORAL_TABLET | ORAL | Status: DC

## 2015-04-25 NOTE — Telephone Encounter (Signed)
Rx filled per patient request 

## 2015-06-02 ENCOUNTER — Telehealth: Payer: Self-pay | Admitting: Family Medicine

## 2015-06-02 DIAGNOSIS — G500 Trigeminal neuralgia: Secondary | ICD-10-CM

## 2015-06-02 MED ORDER — HYDROCODONE-ACETAMINOPHEN 10-325 MG PO TABS: ORAL_TABLET | ORAL | Status: DC

## 2015-06-02 NOTE — Telephone Encounter (Signed)
Rx refilled per patient request 

## 2015-07-05 ENCOUNTER — Telehealth: Payer: Self-pay | Admitting: Family Medicine

## 2015-07-05 DIAGNOSIS — G500 Trigeminal neuralgia: Secondary | ICD-10-CM

## 2015-07-06 MED ORDER — HYDROCODONE-ACETAMINOPHEN 10-325 MG PO TABS: ORAL_TABLET | ORAL | Status: DC

## 2015-07-06 NOTE — Telephone Encounter (Signed)
Rx filled per patient request 

## 2015-07-06 NOTE — Telephone Encounter (Signed)
Pt aware written Rx is at the front desk ready for pickup 

## 2015-07-28 ENCOUNTER — Encounter: Payer: Self-pay | Admitting: *Deleted

## 2015-08-10 ENCOUNTER — Telehealth: Payer: Self-pay | Admitting: Family Medicine

## 2015-08-10 DIAGNOSIS — G500 Trigeminal neuralgia: Secondary | ICD-10-CM

## 2015-08-10 MED ORDER — HYDROCODONE-ACETAMINOPHEN 10-325 MG PO TABS: ORAL_TABLET | ORAL | Status: DC

## 2015-08-10 NOTE — Telephone Encounter (Signed)
Rx refilled per patient request 

## 2015-08-11 NOTE — Telephone Encounter (Signed)
Patient aware that rx is ready to be picked up.  

## 2015-09-12 ENCOUNTER — Telehealth: Payer: Self-pay | Admitting: Family Medicine

## 2015-09-12 DIAGNOSIS — G500 Trigeminal neuralgia: Secondary | ICD-10-CM

## 2015-09-12 NOTE — Telephone Encounter (Signed)
Last filled 08/10/15, last seen 04/05/15

## 2015-09-13 MED ORDER — HYDROCODONE-ACETAMINOPHEN 10-325 MG PO TABS: ORAL_TABLET | ORAL | Status: DC

## 2015-09-13 NOTE — Telephone Encounter (Signed)
Patient aware that rx is ready to be picked up.  

## 2015-10-11 ENCOUNTER — Ambulatory Visit (INDEPENDENT_AMBULATORY_CARE_PROVIDER_SITE_OTHER): Payer: 59 | Admitting: Family Medicine

## 2015-10-11 ENCOUNTER — Encounter: Payer: Self-pay | Admitting: Family Medicine

## 2015-10-11 VITALS — BP 126/69 | HR 86 | Temp 97.9°F | Ht 64.0 in | Wt 172.0 lb

## 2015-10-11 DIAGNOSIS — Z23 Encounter for immunization: Secondary | ICD-10-CM | POA: Diagnosis not present

## 2015-10-11 DIAGNOSIS — Z720 Tobacco use: Secondary | ICD-10-CM

## 2015-10-11 DIAGNOSIS — G500 Trigeminal neuralgia: Secondary | ICD-10-CM

## 2015-10-11 DIAGNOSIS — E785 Hyperlipidemia, unspecified: Secondary | ICD-10-CM | POA: Diagnosis not present

## 2015-10-11 DIAGNOSIS — I10 Essential (primary) hypertension: Secondary | ICD-10-CM | POA: Diagnosis not present

## 2015-10-11 MED ORDER — HYDROCODONE-ACETAMINOPHEN 10-325 MG PO TABS: ORAL_TABLET | ORAL | Status: DC

## 2015-10-11 NOTE — Progress Notes (Signed)
   Subjective:    Patient ID: Grace [name], female    DOB: 22-Sep-1961, 54 y.o.   MRN: [id number]  HPI  54 year old female with hypertension and trigeminal neuralgia. She takes hydrocodone for the trigeminal neuralgia. She has been on multiple other medicines without relief. She takes about 1-1/2 hydrocodone per day. Blood pressures have been well controlled on current regimen of amlodipine and losartan   Review of Systems  Constitutional: Negative.   Respiratory: Negative.   Cardiovascular: Negative.   Gastrointestinal: Negative.   Neurological: Positive for headaches.  Psychiatric/Behavioral: Negative.        Patient Active Problem List   Diagnosis Date Noted  . Hyperlipidemia   . Hypertension   . Vitamin D deficiency   . Tobacco user   . HTN (hypertension) 05/15/2013  . Trigeminal neuralgia 05/15/2013  . Unspecified vitamin D deficiency 05/15/2013  . HLD (hyperlipidemia) 05/15/2013   Outpatient Encounter Prescriptions as of 10/11/2015  Medication Sig  . amLODipine (NORVASC) 10 MG tablet TAKE 1 TABLET BY MOUTH EVERY DAY  . aspirin EC 81 MG tablet Take 81 mg by mouth daily.  . Cholecalciferol (VITAMIN D) 2000 UNITS CAPS Take 2,000 Units by mouth.  Marland Kitchen HYDROcodone-acetaminophen (NORCO) 10-325 MG per tablet TAKE ONE TABLET BY MOUTH TID DAILY AS NEEDED FOR  TRIGEMINAL  NEURALGIA  . losartan (COZAAR) 25 MG tablet TAKE 1 TABLET BY MOUTH EVERY DAY   No facility-administered encounter medications on file as of 10/11/2015.    Objective:   Physical Exam  Constitutional: She is oriented to person, place, and time. She appears well-developed and well-nourished.  Cardiovascular: Normal rate, regular rhythm and normal heart sounds.   Pulmonary/Chest: Effort normal and breath sounds normal.  Musculoskeletal: Normal range of motion.  Neurological: She is alert and oriented to person, place, and time.  Psychiatric: Thought content normal.          Assessment & Plan:  1.  Essential hypertension Continue with current regimen as blood pressure is good  2. Trigeminal neuralgia Has pain related to this regularly requiring hydrocodone - HYDROcodone-acetaminophen (NORCO) 10-325 MG tablet; TAKE ONE TABLET BY MOUTH TID DAILY AS NEEDED FOR  TRIGEMINAL  NEURALGIA  Dispense: 90 tablet; Refill: 0  3. HLD (hyperlipidemia) Although lipids are elevated patient is not interested in taking medicine and in fact her CHD risk would put her in a low risk category for heart disease and stroke  4. Tobacco user Counseled regarding stopping  5. Encounter for immunization Flu shot   Frederica Kuster MD

## 2015-11-14 ENCOUNTER — Other Ambulatory Visit: Payer: Self-pay | Admitting: Family Medicine

## 2015-11-14 DIAGNOSIS — G500 Trigeminal neuralgia: Secondary | ICD-10-CM

## 2015-11-15 MED ORDER — HYDROCODONE-ACETAMINOPHEN 10-325 MG PO TABS: ORAL_TABLET | ORAL | Status: DC

## 2015-12-19 ENCOUNTER — Other Ambulatory Visit: Payer: Self-pay | Admitting: Family Medicine

## 2015-12-19 ENCOUNTER — Other Ambulatory Visit: Payer: Self-pay | Admitting: *Deleted

## 2015-12-19 DIAGNOSIS — I10 Essential (primary) hypertension: Secondary | ICD-10-CM

## 2015-12-19 DIAGNOSIS — G500 Trigeminal neuralgia: Secondary | ICD-10-CM

## 2015-12-19 MED ORDER — LOSARTAN POTASSIUM 25 MG PO TABS
ORAL_TABLET | ORAL | Status: DC
Start: 2015-12-19 — End: 2016-08-21

## 2015-12-19 MED ORDER — LOSARTAN POTASSIUM 25 MG PO TABS: ORAL_TABLET | ORAL | Status: DC

## 2015-12-19 MED ORDER — AMLODIPINE BESYLATE 10 MG PO TABS: ORAL_TABLET | ORAL | Status: DC

## 2015-12-19 NOTE — Telephone Encounter (Signed)
done

## 2015-12-19 NOTE — Telephone Encounter (Signed)
Millers pt. Last seen 10/11/15, last filled 11/15/15

## 2015-12-20 MED ORDER — HYDROCODONE-ACETAMINOPHEN 10-325 MG PO TABS: ORAL_TABLET | ORAL | Status: DC

## 2015-12-20 NOTE — Telephone Encounter (Signed)
Miller to sign when he comes on Wednesday

## 2016-01-19 ENCOUNTER — Other Ambulatory Visit: Payer: Self-pay | Admitting: Family Medicine

## 2016-01-19 DIAGNOSIS — G500 Trigeminal neuralgia: Secondary | ICD-10-CM

## 2016-01-19 MED ORDER — HYDROCODONE-ACETAMINOPHEN 10-325 MG PO TABS: ORAL_TABLET | ORAL | Status: DC

## 2016-01-20 NOTE — Telephone Encounter (Signed)
Patient aware that rx is ready to be picked up.  

## 2016-02-21 ENCOUNTER — Other Ambulatory Visit: Payer: Self-pay

## 2016-02-21 ENCOUNTER — Other Ambulatory Visit: Payer: Self-pay | Admitting: Family Medicine

## 2016-02-21 DIAGNOSIS — G500 Trigeminal neuralgia: Secondary | ICD-10-CM

## 2016-02-21 MED ORDER — HYDROCODONE-ACETAMINOPHEN 10-325 MG PO TABS: ORAL_TABLET | ORAL | Status: DC

## 2016-02-21 NOTE — Telephone Encounter (Signed)
Patient last seen in office on 10-11-15. Rx last filled on 01-19-16 for #90. Please advise. Rx will print. Please route to pool A so nurse can call patient to pick up

## 2016-02-22 ENCOUNTER — Encounter: Payer: Self-pay | Admitting: Family Medicine

## 2016-02-22 ENCOUNTER — Ambulatory Visit (INDEPENDENT_AMBULATORY_CARE_PROVIDER_SITE_OTHER): Payer: BLUE CROSS/BLUE SHIELD | Admitting: Family Medicine

## 2016-02-22 VITALS — BP 131/66 | HR 80 | Temp 98.2°F | Ht 64.0 in | Wt 164.0 lb

## 2016-02-22 DIAGNOSIS — I10 Essential (primary) hypertension: Secondary | ICD-10-CM

## 2016-02-22 DIAGNOSIS — G500 Trigeminal neuralgia: Secondary | ICD-10-CM | POA: Diagnosis not present

## 2016-02-22 NOTE — Progress Notes (Signed)
   Subjective:    Patient ID: OSHA RANE, female    DOB: 12/28/1961, 55 y.o.   MRN: [id number]  HPI 55 year old female here to follow-up hypertension. She has been on amlodipine and losartan and blood pressure control has been good. She does complain of some flushing that I suspect is amlodipine but may be related to hormones. There is a history of trigeminal neuralgia2 and she uses hydrocodone as needed for that problem. She also complains of some ringing in her years. There is no dizziness or hearing loss associated.    Review of Systems  Constitutional: Negative.   HENT: Positive for tinnitus.   Eyes: Negative.   Respiratory: Negative.   Cardiovascular: Negative.   Gastrointestinal: Negative.   Endocrine: Negative.   Genitourinary: Negative.   Hematological: Negative.   Psychiatric/Behavioral: Negative.        Patient Active Problem List   Diagnosis Date Noted  . Hyperlipidemia   . Hypertension   . Vitamin D deficiency   . Tobacco user   . HTN (hypertension) 05/15/2013  . Trigeminal neuralgia 05/15/2013  . Unspecified vitamin D deficiency 05/15/2013  . HLD (hyperlipidemia) 05/15/2013   Outpatient Encounter Prescriptions as of 02/22/2016  Medication Sig  . amLODipine (NORVASC) 10 MG tablet TAKE 1 TABLET BY MOUTH EVERY DAY  . aspirin EC 81 MG tablet Take 81 mg by mouth daily.  . Cholecalciferol (VITAMIN D) 2000 UNITS CAPS Take 2,000 Units by mouth.  Marland Kitchen HYDROcodone-acetaminophen (NORCO) 10-325 MG tablet TAKE ONE TABLET BY MOUTH TID DAILY AS NEEDED FOR  TRIGEMINAL  NEURALGIA  . losartan (COZAAR) 25 MG tablet TAKE 1 TABLET BY MOUTH EVERY DAY   No facility-administered encounter medications on file as of 02/22/2016.    Objective:   Physical Exam  Constitutional: She is oriented to person, place, and time. She appears well-developed and well-nourished.  HENT:  Both external auditory canals with cerumen impaction  Cardiovascular: Normal rate and regular rhythm.     Pulmonary/Chest: Effort normal and breath sounds normal.  Neurological: She is alert and oriented to person, place, and time.  Psychiatric: She has a normal mood and affect.          Assessment & Plan:  1. Essential hypertension Sugar adequately controlled on current regimen. May consider reduction of amlodipine to 5 mg if edema or flushing worsen.  2. Trigeminal neuralgia With hydrocodone when necessary.  Frederica Kuster MD

## 2016-02-22 NOTE — Patient Instructions (Addendum)
Place cerumen impaction patient instructions here. Cerumen Impaction The structures of the external ear canal secrete a waxy substance known as cerumen. Excess cerumen can build up in the ear canal, causing a condition known as cerumen impaction. Cerumen impaction can cause ear pain and disrupt the function of the ear. The rate of cerumen production differs for each individual. In certain individuals, the configuration of the ear canal may decrease his or her ability to naturally remove cerumen. CAUSES Cerumen impaction is caused by excessive cerumen production or buildup. RISK FACTORS  Frequent use of swabs to clean ears.  Having narrow ear canals.  Having eczema.  Being dehydrated. SIGNS AND SYMPTOMS  Diminished hearing.  Ear drainage.  Ear pain.  Ear itch. TREATMENT Treatment may involve:  Over-the-counter or prescription ear drops to soften the cerumen.  Removal of cerumen by a health care provider. This may be done with:  Irrigation with warm water. This is the most common method of removal.  Ear curettes and other instruments.  Surgery. This may be done in severe cases. HOME CARE INSTRUCTIONS  Take medicines only as directed by your health care provider.  Do not insert objects into the ear with the intent of cleaning the ear. PREVENTION  Do not insert objects into the ear, even with the intent of cleaning the ear. Removing cerumen as a part of normal hygiene is not necessary, and the use of swabs in the ear canal is not recommended.  Drink enough water to keep your urine clear or pale yellow.  Control your eczema if you have it. SEEK MEDICAL CARE IF:  You develop ear pain.  You develop bleeding from the ear.  The cerumen does not clear after you use ear drops as directed.   This information is not intended to replace advice given to you by your health care provider. Make sure you discuss any questions you have with your health care provider.   Document  Released: 01/24/2005 Document Revised: 01/07/2015 Document Reviewed: 08/03/2015 Elsevier Interactive Patient Education [employer]

## 2016-03-02 ENCOUNTER — Encounter: Payer: Self-pay | Admitting: *Deleted

## 2016-03-19 ENCOUNTER — Other Ambulatory Visit: Payer: Self-pay | Admitting: Family Medicine

## 2016-03-20 ENCOUNTER — Other Ambulatory Visit: Payer: Self-pay | Admitting: Family Medicine

## 2016-03-20 DIAGNOSIS — G500 Trigeminal neuralgia: Secondary | ICD-10-CM

## 2016-03-20 MED ORDER — HYDROCODONE-ACETAMINOPHEN 10-325 MG PO TABS: ORAL_TABLET | ORAL | Status: DC

## 2016-03-20 NOTE — Telephone Encounter (Signed)
Last seen and filled 02/22/16. Rx will print

## 2016-03-21 NOTE — Telephone Encounter (Signed)
Patient aware rx is ready to be picked up 

## 2016-04-19 ENCOUNTER — Other Ambulatory Visit: Payer: Self-pay | Admitting: Family Medicine

## 2016-04-19 DIAGNOSIS — G500 Trigeminal neuralgia: Secondary | ICD-10-CM

## 2016-04-20 NOTE — Telephone Encounter (Signed)
Patient will be running out on Monday. Patient asked if we could call her back Monday to check and see how she does.

## 2016-04-26 MED ORDER — HYDROCODONE-ACETAMINOPHEN 10-325 MG PO TABS: ORAL_TABLET | ORAL | Status: DC

## 2016-05-16 ENCOUNTER — Telehealth: Payer: Self-pay | Admitting: Family Medicine

## 2016-05-16 DIAGNOSIS — G500 Trigeminal neuralgia: Secondary | ICD-10-CM

## 2016-05-16 NOTE — Telephone Encounter (Signed)
Prescription was refilled but pharmacy will probably not let her have it until it's time

## 2016-05-17 ENCOUNTER — Telehealth: Payer: Self-pay | Admitting: *Deleted

## 2016-05-17 ENCOUNTER — Other Ambulatory Visit: Payer: Self-pay | Admitting: *Deleted

## 2016-05-17 ENCOUNTER — Other Ambulatory Visit: Payer: Self-pay

## 2016-05-17 DIAGNOSIS — G500 Trigeminal neuralgia: Secondary | ICD-10-CM

## 2016-05-17 MED ORDER — HYDROCODONE-ACETAMINOPHEN 10-325 MG PO TABS: ORAL_TABLET | ORAL | Status: DC

## 2016-05-17 NOTE — Telephone Encounter (Signed)
Rx being filled by Dr. Christell ConstantMoore

## 2016-05-17 NOTE — Telephone Encounter (Signed)
Patient aware that prescription is ready for pick up and aware that pharmacy may not fill medication until it is time to fill

## 2016-06-07 ENCOUNTER — Ambulatory Visit (INDEPENDENT_AMBULATORY_CARE_PROVIDER_SITE_OTHER): Payer: BLUE CROSS/BLUE SHIELD | Admitting: Family Medicine

## 2016-06-07 ENCOUNTER — Encounter: Payer: Self-pay | Admitting: Family Medicine

## 2016-06-07 VITALS — BP 121/65 | HR 71 | Temp 98.5°F | Ht 64.0 in | Wt 162.2 lb

## 2016-06-07 DIAGNOSIS — J209 Acute bronchitis, unspecified: Secondary | ICD-10-CM | POA: Diagnosis not present

## 2016-06-07 MED ORDER — AMOXICILLIN 400 MG/5ML PO SUSR: 1000.0000 mg | Freq: Two times a day (BID) | ORAL | Status: DC

## 2016-06-07 NOTE — Progress Notes (Signed)
   Subjective:    Patient ID: Grace [name], female    DOB: 04/14/1961, 55 y.o.   MRN: [id number]  HPI 55 year old female with productive cough 2 weeks. Exposed to [place] to [place]. Cough is productive of green sputum. She has been using Mucinex and Robitussin without relief. She denies fever or chills.  Patient Active Problem List   Diagnosis Date Noted  . Hyperlipidemia   . Hypertension   . Vitamin D deficiency   . Tobacco user   . HTN (hypertension) 05/15/2013  . Trigeminal neuralgia 05/15/2013  . Unspecified vitamin D deficiency 05/15/2013  . HLD (hyperlipidemia) 05/15/2013   Outpatient Encounter Prescriptions as of 06/07/2016  Medication Sig  . amLODipine (NORVASC) 10 MG tablet TAKE 1 TABLET BY MOUTH EVERY DAY  . aspirin EC 81 MG tablet Take 81 mg by mouth daily.  . Cholecalciferol (VITAMIN D) 2000 UNITS CAPS Take 2,000 Units by mouth.  Marland Kitchen. HYDROcodone-acetaminophen (NORCO) 10-325 MG tablet TAKE ONE TABLET BY MOUTH TID DAILY AS NEEDED FOR  TRIGEMINAL  NEURALGIA  . losartan (COZAAR) 25 MG tablet TAKE 1 TABLET BY MOUTH EVERY DAY  . [DISCONTINUED] losartan (COZAAR) 25 MG tablet TAKE 1 TABLET BY MOUTH EVERY DAY   No facility-administered encounter medications on file as of 06/07/2016.      Review of Systems  Constitutional: Positive for fatigue.  HENT: Positive for congestion and sinus pressure.   Respiratory: Positive for cough.   Cardiovascular: Negative.   Neurological: Negative.        Objective:   Physical Exam  Constitutional: She is oriented to person, place, and time. She appears well-developed and well-nourished.  HENT:  Mouth/Throat: Oropharynx is clear and moist.  Cardiovascular: Normal rate and regular rhythm.   Pulmonary/Chest: Effort normal.  Neurological: She is alert and oriented to person, place, and time.  Psychiatric: She has a normal mood and affect. Her behavior is normal.   BP 121/65 mmHg  Pulse 71   Temp(Src) 98.5 F (36.9 C) (Oral)  Ht 5\' 4"  (1.626 m)  Wt 162 lb 3.2 oz (73.573 kg)  BMI 27.83 kg/m2        Assessment & Plan:  1. Acute bronchitis, unspecified organism Probably has some sinus infection as well as bronchitis plan will use amoxicillin 1000 mg twice a day. Continue with Mucinex or Robitussin) pineal fluids  Frederica KusterStephen M Sonda Coppens MD

## 2016-06-19 ENCOUNTER — Other Ambulatory Visit: Payer: Self-pay | Admitting: Family Medicine

## 2016-06-19 DIAGNOSIS — G500 Trigeminal neuralgia: Secondary | ICD-10-CM

## 2016-06-21 NOTE — Telephone Encounter (Signed)
Last filled 05/25/16, last seen 02/22/16

## 2016-06-22 MED ORDER — HYDROCODONE-ACETAMINOPHEN 10-325 MG PO TABS: ORAL_TABLET | ORAL | Status: DC

## 2016-07-06 ENCOUNTER — Ambulatory Visit (INDEPENDENT_AMBULATORY_CARE_PROVIDER_SITE_OTHER): Payer: BLUE CROSS/BLUE SHIELD | Admitting: Family Medicine

## 2016-07-06 ENCOUNTER — Encounter: Payer: Self-pay | Admitting: Family Medicine

## 2016-07-06 ENCOUNTER — Ambulatory Visit: Payer: BLUE CROSS/BLUE SHIELD | Admitting: Physician Assistant

## 2016-07-06 ENCOUNTER — Ambulatory Visit (INDEPENDENT_AMBULATORY_CARE_PROVIDER_SITE_OTHER): Payer: BLUE CROSS/BLUE SHIELD

## 2016-07-06 VITALS — BP 116/65 | HR 80 | Temp 98.0°F | Ht 64.0 in | Wt 161.0 lb

## 2016-07-06 DIAGNOSIS — M25562 Pain in left knee: Secondary | ICD-10-CM | POA: Diagnosis not present

## 2016-07-06 DIAGNOSIS — G500 Trigeminal neuralgia: Secondary | ICD-10-CM

## 2016-07-06 MED ORDER — HYDROCODONE-ACETAMINOPHEN 10-325 MG PO TABS: 1.0000 | ORAL_TABLET | Freq: Four times a day (QID) | ORAL | Status: DC | PRN

## 2016-07-06 NOTE — Progress Notes (Signed)
   Subjective:    Patient ID: Grace [name], female    DOB: 07/15/1961, 55 y.o.   MRN: [id number]  HPI Patient here today for left knee pain that started on Monday after bending and hearing a "pop". Knee has been swollen since with pain with weightbearing. She has obtained a knee sleeve and another brace without metal stays that she has been using. There is been no history of giving way or locking. Swelling tends to worsen as the day progresses.    [facility] 07/06/2016 06/07/2016 10/11/2015 11/30/2014  Decreased Interest 0 0 0 0  Down, Depressed, Hopeless 1 1 0 1  PHQ - 2 Score 1 1 0 1     Patient Active Problem List   Diagnosis Date Noted  . Hyperlipidemia   . Hypertension   . Vitamin D deficiency   . Tobacco user   . HTN (hypertension) 05/15/2013  . Trigeminal neuralgia 05/15/2013  . Unspecified vitamin D deficiency 05/15/2013  . HLD (hyperlipidemia) 05/15/2013   Outpatient Encounter Prescriptions as of 07/06/2016  Medication Sig  . amLODipine (NORVASC) 10 MG tablet TAKE 1 TABLET BY MOUTH EVERY DAY  . amoxicillin (AMOXIL) 400 MG/5ML suspension Take 12.5 mLs (1,000 mg total) by mouth 2 (two) times daily.  Marland Kitchen. aspirin EC 81 MG tablet Take 81 mg by mouth daily.  . Cholecalciferol (VITAMIN D) 2000 UNITS CAPS Take 2,000 Units by mouth.  Marland Kitchen. HYDROcodone-acetaminophen (NORCO) 10-325 MG tablet TAKE ONE TABLET BY MOUTH TID DAILY AS NEEDED FOR  TRIGEMINAL  NEURALGIA  . losartan (COZAAR) 25 MG tablet TAKE 1 TABLET BY MOUTH EVERY DAY   No facility-administered encounter medications on file as of 07/06/2016.      Review of Systems  Constitutional: Negative.   HENT: Negative.   Eyes: Negative.   Respiratory: Negative.   Cardiovascular: Negative.   Gastrointestinal: Negative.   Endocrine: Negative.   Genitourinary: Negative.   Musculoskeletal: Positive for arthralgias (left knee pain).  Skin: Negative.   Allergic/Immunologic: Negative.   Neurological: Negative.     Hematological: Negative.   Psychiatric/Behavioral: Negative.        Objective:   Physical Exam  Constitutional: She appears well-developed and well-nourished.  Musculoskeletal:  Left knee: There is some effusion present. There is no tenderness along either joint line. Stress testing is negative. Drawer test is negative. Quad function seems normal. Palpation and movement of the knee Produces no special pain.  X-ray no evidence of bony abnormality.   BP 116/65 mmHg  Pulse 80  Temp(Src) 98 F (36.7 C) (Oral)  Ht 5\' 4"  (1.626 m)  Wt 161 lb (73.029 kg)  BMI 27.62 kg/m2        Assessment & Plan:  1. Left knee pain For now, will call this a strained. If symptoms do not resolve with continued use of knee bracing ice and analgesic will have her see orthopedic - DG Knee 1-2 Views Left; Future - Ambulatory referral to Orthopedic Surgery  2. Trigeminal neuralgia Hydrocodone helps the pain but she will be taking more now related to her knee pain - HYDROcodone-acetaminophen (NORCO) 10-325 MG tablet; Take 1-2 tablets by mouth every 6 (six) hours as needed. TAKE ONE TABLET BY MOUTH TID DAILY AS NEEDED FOR  TRIGEMINAL  NEURALGIA  Dispense: 90 tablet; Refill: 0  Frederica KusterStephen M Loranda Mastel MD

## 2016-07-20 ENCOUNTER — Telehealth: Payer: Self-pay | Admitting: Family Medicine

## 2016-07-25 ENCOUNTER — Encounter: Payer: Self-pay | Admitting: Orthopaedic Surgery

## 2016-07-25 ENCOUNTER — Telehealth: Payer: Self-pay | Admitting: Family Medicine

## 2016-07-25 ENCOUNTER — Ambulatory Visit (INDEPENDENT_AMBULATORY_CARE_PROVIDER_SITE_OTHER): Payer: BLUE CROSS/BLUE SHIELD | Admitting: Orthopaedic Surgery

## 2016-07-25 VITALS — BP 123/65 | HR 88 | Temp 97.9°F | Ht 64.0 in | Wt 158.0 lb

## 2016-07-25 DIAGNOSIS — F17200 Nicotine dependence, unspecified, uncomplicated: Secondary | ICD-10-CM

## 2016-07-25 DIAGNOSIS — G500 Trigeminal neuralgia: Secondary | ICD-10-CM

## 2016-07-25 DIAGNOSIS — Z720 Tobacco use: Secondary | ICD-10-CM | POA: Diagnosis not present

## 2016-07-25 DIAGNOSIS — M25562 Pain in left knee: Secondary | ICD-10-CM

## 2016-07-25 MED ORDER — NAPROXEN 500 MG PO TABS: 500.0000 mg | ORAL_TABLET | Freq: Two times a day (BID) | ORAL | 5 refills | Status: DC

## 2016-07-25 NOTE — Telephone Encounter (Signed)
Patient last seen in office on 07-06-16. Rx last filled on 07-06-16 for #90. Please advise and route to Pool A so nurse can call patient to pick up if approved. If approved rx will print

## 2016-07-25 NOTE — Progress Notes (Signed)
Subjective:  My left knee hurts    Patient ID: Grace [name], female    DOB: 01-Feb-1961, 55 y.o.   MRN: [id number]  HPI She has had pain in the left knee for about three weeks now.  She was squatted down and then lifted up a laptop bag when arising she felt a severe pain and pop in the left knee more medially.  It has remained tender.  She used ice and took Advil with help.  But the swelling has remained in the knee.  She has no giving way but feels the knee might be unstable at times.  She says it has not really gotten any better and also not any worse.  She was seen at [facility] on 07-06-16 for this and had x-rays which were negative.  She is concerned her pain is not improved.  I would like to get a MRI as I feel she has a medial meniscus tear on the left knee but we have to wait six weeks for insurance to pay.  She has recently undergone extensive dental procedures.   Review of Systems  HENT: Negative for congestion.   Respiratory: Negative for cough and shortness of breath.   Cardiovascular: Negative for chest pain and leg swelling.  Endocrine: Positive for cold intolerance.  Musculoskeletal: Positive for arthralgias, gait problem and joint swelling.  Allergic/Immunologic: Positive for environmental allergies.  Psychiatric/Behavioral: The patient is nervous/anxious.    Past Medical History:  Diagnosis Date  . Hyperlipidemia   . Hypertension   . Tobacco user   . Trigeminal neuralgia   . Vitamin D deficiency     Past Surgical History:  Procedure Laterality Date  . CHOLECYSTECTOMY    . DILATION AND CURETTAGE OF UTERUS      Current Outpatient Prescriptions on File Prior to Visit  Medication Sig Dispense Refill  . amLODipine (NORVASC) 10 MG tablet TAKE 1 TABLET BY MOUTH EVERY DAY 90 tablet 1  . aspirin EC 81 MG tablet Take 81 mg by mouth daily.    . Cholecalciferol (VITAMIN D) 2000 UNITS CAPS Take 2,000 Units by mouth.    Marland Kitchen HYDROcodone-acetaminophen (NORCO) 10-325  MG tablet Take 1-2 tablets by mouth every 6 (six) hours as needed. TAKE ONE TABLET BY MOUTH TID DAILY AS NEEDED FOR  TRIGEMINAL  NEURALGIA 90 tablet 0  . losartan (COZAAR) 25 MG tablet TAKE 1 TABLET BY MOUTH EVERY DAY 90 tablet 0  . amoxicillin (AMOXIL) 400 MG/5ML suspension Take 12.5 mLs (1,000 mg total) by mouth 2 (two) times daily. (Patient not taking: Reported on 07/25/2016) 100 mL 0   No current facility-administered medications on file prior to visit.     Social History   Social History  . Marital status: Divorced    Spouse name: N/A  . Number of children: N/A  . Years of education: N/A   Occupational History  . Not on file.   Social History Main Topics  . Smoking status: Current Every Day Smoker    Packs/day: 1.00    Types: Cigarettes  . Smokeless tobacco: Never Used  . Alcohol use No  . Drug use: No  . Sexual activity: Not on file   Other Topics Concern  . Not on file   Social History Narrative  . No narrative on file    Family History  Problem Relation Age of Onset  . Hypertension Mother   . Dementia Mother   . Alcohol abuse Mother   . Cancer Father  lung    BP 123/65   Pulse 88   Temp 97.9 F (36.6 C)   Ht  (1.626 m)   Wt 158 lb (71.7 kg)   BMI 27.12 kg/m      Objective:   Physical Exam  Constitutional: She is oriented to person, place, and time. She appears well-developed and well-nourished.  HENT:  Head: Normocephalic and atraumatic.  Eyes: Conjunctivae and EOM are normal. Pupils are equal, round, and reactive to light.  Neck: Normal range of motion. Neck supple.  Cardiovascular: Normal rate, regular rhythm and intact distal pulses.   Pulmonary/Chest: Effort normal.  Abdominal: Soft.  Musculoskeletal: She exhibits tenderness (Pain left knee, more medially, slight effusion, ROM 0 to 110, weakly positive medial McMurray, gait limp to the left; right knee negative.).  Neurological: She is alert and oriented to person, place, and time.  She displays normal reflexes. No cranial nerve deficit. She exhibits normal muscle tone. Coordination normal.  Skin: Skin is warm and dry.  Psychiatric: She has a normal mood and affect. Her behavior is normal. Judgment and thought content normal.          Assessment & Plan:   Encounter Diagnoses  Name Primary?  . Knee pain, left Yes  . Tobacco smoker within last 12 months    She will have to wait another three weeks to get MRI of the knee.  I am concerned about a medial meniscus tear.  She smokes and I have talked to her about cutting back or stopping.  Call if any problem.  Rx for Naprosyn given.  Precautions discussed.  Electronically Signed Darreld Mclean, MD 7/26/20171:02 PM

## 2016-07-25 NOTE — Patient Instructions (Signed)
Continue medications. Precautions discussed.  MRI to be ordered if no better by next visit in a month.

## 2016-07-31 NOTE — Telephone Encounter (Signed)
Please see if the patient has enough hydrocodone to last until she sees Dr. Hyacinth Meeker on Thursday or can get a prescription from him then. Otherwise give her enough to do until he returns on Thursday.

## 2016-07-31 NOTE — Telephone Encounter (Signed)
Pt needs refill of Hydrocodone She is out of medication Dr Hyacinth Meeker will not be back until Thursday Please review and advise

## 2016-08-01 MED ORDER — HYDROCODONE-ACETAMINOPHEN 10-325 MG PO TABS: ORAL_TABLET | ORAL | 0 refills | Status: DC

## 2016-08-01 NOTE — Telephone Encounter (Signed)
Pt aware will print today and dr Hyacinth Meeker to [facility] Thursday am

## 2016-08-21 ENCOUNTER — Ambulatory Visit (INDEPENDENT_AMBULATORY_CARE_PROVIDER_SITE_OTHER): Payer: BLUE CROSS/BLUE SHIELD | Admitting: Family Medicine

## 2016-08-21 ENCOUNTER — Encounter: Payer: Self-pay | Admitting: Family Medicine

## 2016-08-21 VITALS — BP 101/57 | HR 84 | Temp 98.0°F | Ht 64.0 in | Wt 161.0 lb

## 2016-08-21 DIAGNOSIS — M25562 Pain in left knee: Secondary | ICD-10-CM

## 2016-08-21 DIAGNOSIS — G500 Trigeminal neuralgia: Secondary | ICD-10-CM | POA: Diagnosis not present

## 2016-08-21 DIAGNOSIS — E785 Hyperlipidemia, unspecified: Secondary | ICD-10-CM | POA: Diagnosis not present

## 2016-08-21 DIAGNOSIS — I10 Essential (primary) hypertension: Secondary | ICD-10-CM | POA: Diagnosis not present

## 2016-08-21 MED ORDER — HYDROCODONE-ACETAMINOPHEN 10-325 MG PO TABS: ORAL_TABLET | ORAL | 0 refills | Status: DC

## 2016-08-21 MED ORDER — LOSARTAN POTASSIUM 25 MG PO TABS: ORAL_TABLET | ORAL | 3 refills | Status: DC

## 2016-08-21 MED ORDER — AMLODIPINE BESYLATE 5 MG PO TABS: 5.0000 mg | ORAL_TABLET | Freq: Every day | ORAL | 3 refills | Status: DC

## 2016-08-21 NOTE — Progress Notes (Signed)
Subjective:    Patient ID: Grace [name], female    DOB: August 04, 1961, 55 y.o.   MRN: [id number]  HPI Pt here for follow up and management of chronic medical problems which includes hyperlipidemia and hypertension. She is taking medications regularly. She has frequent pain from trigeminal neuralgia. I thought she was initially taking the hydrocodone as needed for pain but now I believe she is taking it 3 to day unless the pain gets really bad and then she takes it more. Her blood pressure has been really good, slow the last few times and she would like to reduce her amlodipine from 10 mg to 5 mg as she does seem to have more dependent edema. She also has some pain in her left knee. She saw orthopedist who thought she had torn something but she has to wait 12 weeks for potential MRI. He had offered her an injection which she declined but now she has second thoughts and would like to have that done today   Patient Active Problem List   Diagnosis Date Noted  . Hyperlipidemia   . Hypertension   . Vitamin D deficiency   . Tobacco user   . HTN (hypertension) 05/15/2013  . Trigeminal neuralgia 05/15/2013  . Unspecified vitamin D deficiency 05/15/2013  . HLD (hyperlipidemia) 05/15/2013   Outpatient Encounter Prescriptions as of 08/21/2016  Medication Sig  . amLODipine (NORVASC) 10 MG tablet TAKE 1 TABLET BY MOUTH EVERY DAY  . aspirin EC 81 MG tablet Take 81 mg by mouth daily.  . Cholecalciferol (VITAMIN D) 2000 UNITS CAPS Take 2,000 Units by mouth.  Marland Kitchen HYDROcodone-acetaminophen (NORCO) 10-325 MG tablet TAKE ONE TABLET BY MOUTH TID DAILY AS NEEDED FOR  TRIGEMINAL  NEURALGIA  . losartan (COZAAR) 25 MG tablet TAKE 1 TABLET BY MOUTH EVERY DAY  . naproxen (NAPROSYN) 500 MG tablet Take 1 tablet (500 mg total) by mouth 2 (two) times daily with a meal.  . [DISCONTINUED] amoxicillin (AMOXIL) 400 MG/5ML suspension Take 12.5 mLs (1,000 mg total) by mouth 2 (two) times daily. (Patient not taking: Reported  on 07/25/2016)   No facility-administered encounter medications on file as of 08/21/2016.       Review of Systems  Constitutional: Negative.   HENT: Negative.   Eyes: Negative.   Respiratory: Negative.   Cardiovascular: Negative.   Gastrointestinal: Negative.   Endocrine: Negative.   Genitourinary: Negative.   Musculoskeletal: Negative.   Skin: Negative.   Allergic/Immunologic: Negative.   Neurological: Negative.   Hematological: Negative.   Psychiatric/Behavioral: Negative.        Objective:   Physical Exam  Constitutional: She is oriented to person, place, and time. She appears well-developed and well-nourished.  Cardiovascular: Normal rate, regular rhythm and normal heart sounds.   Pulmonary/Chest: Effort normal and breath sounds normal.  Musculoskeletal:  Left knee injected using medial approach using Depo-Medrol and Marcaine. Injection went in easily and was well tolerated and I have told her this may or may not help if it is a meniscal tear but I do not see any downside.  Neurological: She is alert and oriented to person, place, and time.  Psychiatric: She has a normal mood and affect. Her behavior is normal.    BP (!) 101/57 (BP Location: Left Arm)   Pulse 84   Temp 98 F (36.7 C) (Oral)   Ht 5' 4"  (1.626 m)   Wt 161 lb (73 kg)   BMI 27.64 kg/m        Assessment &  Plan:  1. Essential hypertension Reduce amlodipine to 5 mg and follow blood pressure - CMP14+EGFR - losartan (COZAAR) 25 MG tablet; TAKE 1 TABLET BY MOUTH EVERY DAY  Dispense: 90 tablet; Refill: 3  2. HLD (hyperlipidemia) Last LDL checked 3 years ago was 146 - Lipid panel  3. Trigeminal neuralgia Refill hydrocodone - HYDROcodone-acetaminophen (NORCO) 10-325 MG tablet; TAKE ONE TABLET BY MOUTH TID DAILY AS NEEDED FOR  TRIGEMINAL  NEURALGIA  Dispense: 90 tablet; Refill: 0  4. Left knee pain Injected with Depo-Medrol as noted above  Wardell Honour MD

## 2016-08-21 NOTE — Patient Instructions (Signed)
Continue current medications. Continue good therapeutic lifestyle changes which include good diet and exercise. Fall precautions discussed with patient. If an FOBT was given today- please return it to our front desk. If you are over 57 years old - you may need Prevnar 13 or the adult Pneumonia vaccine.   After your visit with us today you will receive a survey in the mail or online from [employer] regarding your care with us. Please take a moment to fill this out. Your feedback is very important to us as you can help us better understand your patient needs as well as improve your experience and satisfaction. WE CARE ABOUT YOU!!!    

## 2016-08-28 ENCOUNTER — Ambulatory Visit: Payer: BLUE CROSS/BLUE SHIELD | Admitting: Orthopaedic Surgery

## 2016-09-18 ENCOUNTER — Encounter: Payer: Self-pay | Admitting: Orthopaedic Surgery

## 2016-09-18 ENCOUNTER — Ambulatory Visit (INDEPENDENT_AMBULATORY_CARE_PROVIDER_SITE_OTHER): Payer: BLUE CROSS/BLUE SHIELD | Admitting: Orthopaedic Surgery

## 2016-09-18 VITALS — BP 116/58 | HR 75 | Temp 97.7°F | Ht 65.0 in | Wt 154.0 lb

## 2016-09-18 DIAGNOSIS — F17200 Nicotine dependence, unspecified, uncomplicated: Secondary | ICD-10-CM

## 2016-09-18 DIAGNOSIS — M25562 Pain in left knee: Secondary | ICD-10-CM

## 2016-09-18 DIAGNOSIS — F17210 Nicotine dependence, cigarettes, uncomplicated: Secondary | ICD-10-CM

## 2016-09-18 DIAGNOSIS — Z720 Tobacco use: Secondary | ICD-10-CM | POA: Diagnosis not present

## 2016-09-18 NOTE — Progress Notes (Signed)
CC:  I have pain of my left knee. I would like an injection.  The patient has chronic pain of the left knee.  There is no recent trauma.  There is no redness.  Injections in the past have helped.  The knee has no redness, has an effusion and crepitus present.  ROM of the left knee is 0-110.   Encounter Diagnoses  Name Primary?  . Knee pain, left Yes  . Tobacco smoker within last 12 months   . Cigarette nicotine dependence without complication      Return: 1 month  PROCEDURE NOTE:  The patient requests injections of the lft knee, verbal consent was obtained.  The left knee was prepped appropriately after time out was performed.   Sterile technique was observed and injection of 1 cc of Depo-Medrol 40 mg with several cc's of plain xylocaine. Anesthesia was provided by ethyl chloride and a 20-gauge needle was used to inject the knee area. The injection was tolerated well.  A band aid dressing was applied.  The patient was advised to apply ice later today and tomorrow to the injection sight as needed.   She smokes and understands that she should stop or cut back.  She is willing to cut back and will try it.  Smoking Cessation tips have been given.   Electronically Signed Darreld McleanWayne Matisyn Cabeza, MD 9/19/20178:46 AM

## 2016-09-18 NOTE — Patient Instructions (Signed)
Smoking Cessation, Tips for Success If you are ready to quit smoking, congratulations! You have chosen to help yourself be healthier. Cigarettes bring nicotine, tar, carbon monoxide, and other irritants into your body. Your lungs, heart, and blood vessels will be able to work better without these poisons. There are many different ways to quit smoking. Nicotine gum, nicotine patches, a nicotine inhaler, or nicotine nasal spray can help with physical craving. Hypnosis, support groups, and medicines help break the habit of smoking. WHAT THINGS CAN I DO TO MAKE QUITTING EASIER?  Here are some tips to help you quit for good:  Pick a date when you will quit smoking completely. Tell all of your friends and family about your plan to quit on that date.  Do not try to slowly cut down on the number of cigarettes you are smoking. Pick a quit date and quit smoking completely starting on that day.  Throw away all cigarettes.   Clean and remove all ashtrays from your home, work, and car.  On a card, write down your reasons for quitting. Carry the card with you and read it when you get the urge to smoke.  Cleanse your body of nicotine. Drink enough water and fluids to keep your urine clear or pale yellow. Do this after quitting to flush the nicotine from your body.  Learn to predict your moods. Do not let a bad situation be your excuse to have a cigarette. Some situations in your life might tempt you into wanting a cigarette.  Never have "just one" cigarette. It leads to wanting another and another. Remind yourself of your decision to quit.  Change habits associated with smoking. If you smoked while driving or when feeling stressed, try other activities to replace smoking. Stand up when drinking your coffee. Brush your teeth after eating. Sit in a different chair when you read the paper. Avoid alcohol while trying to quit, and try to drink fewer caffeinated beverages. Alcohol and caffeine may urge you to  smoke.  Avoid foods and drinks that can trigger a desire to smoke, such as sugary or spicy foods and alcohol.  Ask people who smoke not to smoke around you.  Have something planned to do right after eating or having a cup of coffee. For example, plan to take a walk or exercise.  Try a relaxation exercise to calm you down and decrease your stress. Remember, you may be tense and nervous for the first 2 weeks after you quit, but this will pass.  Find new activities to keep your hands busy. Play with a pen, coin, or rubber band. Doodle or draw things on paper.  Brush your teeth right after eating. This will help cut down on the craving for the taste of tobacco after meals. You can also try mouthwash.   Use oral substitutes in place of cigarettes. Try using lemon drops, carrots, cinnamon sticks, or chewing gum. Keep them handy so they are available when you have the urge to smoke.  When you have the urge to smoke, try deep breathing.  Designate your home as a nonsmoking area.  If you are a heavy smoker, ask your health care provider about a prescription for nicotine chewing gum. It can ease your withdrawal from nicotine.  Reward yourself. Set aside the cigarette money you save and buy yourself something nice.  Look for support from others. Join a support group or smoking cessation program. Ask someone at home or at work to help you with your plan   to quit smoking.  Always ask yourself, "Do I need this cigarette or is this just a reflex?" Tell yourself, "Today, I choose not to smoke," or "I do not want to smoke." You are reminding yourself of your decision to quit.  Do not replace cigarette smoking with electronic cigarettes (commonly called e-cigarettes). The safety of e-cigarettes is unknown, and some may contain harmful chemicals.  If you relapse, do not give up! Plan ahead and think about what you will do the next time you get the urge to smoke. HOW WILL I FEEL WHEN I QUIT SMOKING? You  may have symptoms of withdrawal because your body is used to nicotine (the addictive substance in cigarettes). You may crave cigarettes, be irritable, feel very hungry, cough often, get headaches, or have difficulty concentrating. The withdrawal symptoms are only temporary. They are strongest when you first quit but will go away within 10-14 days. When withdrawal symptoms occur, stay in control. Think about your reasons for quitting. Remind yourself that these are signs that your body is healing and getting used to being without cigarettes. Remember that withdrawal symptoms are easier to treat than the major diseases that smoking can cause.  Even after the withdrawal is over, expect periodic urges to smoke. However, these cravings are generally short lived and will go away whether you smoke or not. Do not smoke! WHAT RESOURCES ARE AVAILABLE TO HELP ME QUIT SMOKING? Your health care provider can direct you to community resources or hospitals for support, which may include:  Group support.  Education.  Hypnosis.  Therapy.   This information is not intended to replace advice given to you by your health care provider. Make sure you discuss any questions you have with your health care provider.   Document Released: 09/14/2004 Document Revised: 01/07/2015 Document Reviewed: 06/04/2013 Elsevier Interactive Patient Education 2016 Elsevier Inc.  

## 2016-10-09 ENCOUNTER — Ambulatory Visit: Payer: BLUE CROSS/BLUE SHIELD | Admitting: Orthopaedic Surgery

## 2016-10-17 ENCOUNTER — Ambulatory Visit: Payer: BLUE CROSS/BLUE SHIELD | Admitting: Orthopaedic Surgery

## 2016-10-18 ENCOUNTER — Encounter: Payer: Self-pay | Admitting: Orthopaedic Surgery

## 2016-11-14 ENCOUNTER — Encounter: Payer: Self-pay | Admitting: Family Medicine

## 2016-11-14 ENCOUNTER — Ambulatory Visit (INDEPENDENT_AMBULATORY_CARE_PROVIDER_SITE_OTHER): Payer: BLUE CROSS/BLUE SHIELD | Admitting: Family Medicine

## 2016-11-14 VITALS — BP 120/70 | HR 88 | Temp 98.7°F | Ht 65.0 in | Wt 156.0 lb

## 2016-11-14 DIAGNOSIS — Z23 Encounter for immunization: Secondary | ICD-10-CM

## 2016-11-14 DIAGNOSIS — G500 Trigeminal neuralgia: Secondary | ICD-10-CM

## 2016-11-14 MED ORDER — HYDROCODONE-ACETAMINOPHEN 10-325 MG PO TABS: ORAL_TABLET | ORAL | 0 refills | Status: DC

## 2016-11-14 MED ORDER — HYDROCODONE-ACETAMINOPHEN 10-325 MG PO TABS: 1.0000 | ORAL_TABLET | Freq: Three times a day (TID) | ORAL | 0 refills | Status: DC

## 2016-11-14 NOTE — Progress Notes (Signed)
   Subjective:    Patient ID: Grace [name], female    DOB: 11/04/1961, 55 y.o.   MRN: [id number]  HPI 55 year old female with history of trigeminal neuralgia for which she takes hydrocodone 2-3 per day. That controls symptoms pretty well. She is also had some left knee pain and has received several injections there. Last injections were one month apart and I cautioned her about frequency of that but those symptoms are better Taking Norvasc and Cozaar for blood pressure which is well controlled.  Patient Active Problem List   Diagnosis Date Noted  . Hyperlipidemia   . Hypertension   . Vitamin D deficiency   . Tobacco user   . HTN (hypertension) 05/15/2013  . Trigeminal neuralgia 05/15/2013  . Unspecified vitamin D deficiency 05/15/2013  . HLD (hyperlipidemia) 05/15/2013   Outpatient Encounter Prescriptions as of 11/14/2016  Medication Sig  . amLODipine (NORVASC) 5 MG tablet Take 1 tablet (5 mg total) by mouth daily.  Marland Kitchen. aspirin EC 81 MG tablet Take 81 mg by mouth daily.  . Cholecalciferol (VITAMIN D) 2000 UNITS CAPS Take 2,000 Units by mouth.  Marland Kitchen. HYDROcodone-acetaminophen (NORCO) 10-325 MG tablet TAKE ONE TABLET BY MOUTH TID DAILY AS NEEDED FOR  TRIGEMINAL  NEURALGIA  . losartan (COZAAR) 25 MG tablet TAKE 1 TABLET BY MOUTH EVERY DAY  . naproxen (NAPROSYN) 500 MG tablet Take 1 tablet (500 mg total) by mouth 2 (two) times daily with a meal.   No facility-administered encounter medications on file as of 11/14/2016.       Review of Systems  Constitutional: Negative.   HENT: Negative.   Respiratory: Negative.   Cardiovascular: Negative.   Gastrointestinal: Negative.   Neurological: Negative.   Psychiatric/Behavioral: Negative.        Objective:   Physical Exam  Constitutional: She is oriented to person, place, and time. She appears well-developed and well-nourished.  Cardiovascular: Normal rate, regular rhythm and normal heart sounds.   Pulmonary/Chest: Effort normal and  breath sounds normal.  Neurological: She is alert and oriented to person, place, and time.  Psychiatric: She has a normal mood and affect. Her behavior is normal.   BP 120/70   Pulse 88   Temp 98.7 F (37.1 C) (Oral)   Ht 5\' 5"  (1.651 m)   Wt 156 lb (70.8 kg)   BMI 25.96 kg/m         Assessment & Plan:  1. Trigeminal neuralgia Execute a drug contract today. I feel abuse potential is low. This is a little bit of an unorthodox treatment for trigeminal neuralgia but it is effective. - HYDROcodone-acetaminophen (NORCO) 10-325 MG tablet; TAKE ONE TABLET BY MOUTH TID DAILY AS NEEDED FOR  TRIGEMINAL  NEURALGIA  Dispense: 90 tablet; Refill: 0  Frederica KusterStephen M Miller MD

## 2016-11-27 ENCOUNTER — Encounter: Payer: BLUE CROSS/BLUE SHIELD | Admitting: Family Medicine

## 2017-02-06 ENCOUNTER — Ambulatory Visit: Payer: BLUE CROSS/BLUE SHIELD | Admitting: Family Medicine

## 2017-02-14 NOTE — Progress Notes (Signed)
   Subjective:    Patient ID: Grace [name], female    DOB: 08/28/1961, 56 y.o.   MRN: [id number]  HPI 56 year old female with atypical trigeminal neuralgia for which she takes hydrocodone scheduled on a regular basis. Typically trigeminal neuralgia is not chronic and continual pain. She has seen neurology in the past who have recommended this treatment. She does not abuse medicine. She also has hypertension and takes losartan and amlodipine. Blood pressure is a little higher today than last visit but still well controlled. We have not checked her lipids for almost 4 years but she asked that we hold off today since she does not have insurance  Patient Active Problem List   Diagnosis Date Noted  . Hyperlipidemia   . Hypertension   . Vitamin D deficiency   . Tobacco user   . HTN (hypertension) 05/15/2013  . Trigeminal neuralgia 05/15/2013  . Unspecified vitamin D deficiency 05/15/2013  . HLD (hyperlipidemia) 05/15/2013   Outpatient Encounter Prescriptions as of 02/15/2017  Medication Sig  . amLODipine (NORVASC) 5 MG tablet Take 1 tablet (5 mg total) by mouth daily.  . Cholecalciferol (VITAMIN D) 2000 UNITS CAPS Take 2,000 Units by mouth.  . clindamycin (CLEOCIN T) 1 % lotion   . HYDROcodone-acetaminophen (NORCO) 10-325 MG tablet TAKE ONE TABLET BY MOUTH TID DAILY AS NEEDED FOR  TRIGEMINAL  NEURALGIA  . HYDROcodone-acetaminophen (NORCO) 10-325 MG tablet Take 1 tablet by mouth 3 (three) times daily.  Marland Kitchen. HYDROcodone-acetaminophen (NORCO) 10-325 MG tablet Take 1 tablet by mouth 3 (three) times daily. Do not fill till 01/12/17  . losartan (COZAAR) 25 MG tablet TAKE 1 TABLET BY MOUTH EVERY DAY  . aspirin EC 81 MG tablet Take 81 mg by mouth daily.  . [DISCONTINUED] naproxen (NAPROSYN) 500 MG tablet Take 1 tablet (500 mg total) by mouth 2 (two) times daily with a meal.   No facility-administered encounter medications on file as of 02/15/2017.       Review of Systems     Objective:   Physical  Exam  Constitutional: She is oriented to person, place, and time. She appears well-developed and well-nourished.  HENT:  Mouth/Throat: Oropharynx is clear and moist.  Cerumen in both ears  Cardiovascular: Normal rate and regular rhythm.   Pulmonary/Chest: Effort normal and breath sounds normal.  Neurological: She is alert and oriented to person, place, and time.   BP 132/75   Pulse 85   Temp 98.4 F (36.9 C) (Oral)   Ht 5\' 5"  (1.651 m)   Wt 157 lb 6.4 oz (71.4 kg)   BMI 26.19 kg/m         Assessment & Plan:  1. Trigeminal neuralgia Continue with hydrocodone as before - HYDROcodone-acetaminophen (NORCO) 10-325 MG tablet; TAKE ONE TABLET BY MOUTH TID DAILY AS NEEDED FOR  TRIGEMINAL  NEURALGIA  Dispense: 90 tablet; Refill: 0  2. Essential hypertension With amlodipine and losartan as before  Frederica KusterStephen M Miller MD

## 2017-02-15 ENCOUNTER — Encounter: Payer: Self-pay | Admitting: Family Medicine

## 2017-02-15 ENCOUNTER — Ambulatory Visit (INDEPENDENT_AMBULATORY_CARE_PROVIDER_SITE_OTHER): Payer: BLUE CROSS/BLUE SHIELD | Admitting: Family Medicine

## 2017-02-15 VITALS — BP 132/75 | HR 85 | Temp 98.4°F | Ht 65.0 in | Wt 157.4 lb

## 2017-02-15 DIAGNOSIS — I10 Essential (primary) hypertension: Secondary | ICD-10-CM

## 2017-02-15 DIAGNOSIS — G500 Trigeminal neuralgia: Secondary | ICD-10-CM

## 2017-02-15 MED ORDER — HYDROCODONE-ACETAMINOPHEN 10-325 MG PO TABS: 1.0000 | ORAL_TABLET | Freq: Three times a day (TID) | ORAL | 0 refills | Status: DC

## 2017-02-15 MED ORDER — HYDROCODONE-ACETAMINOPHEN 10-325 MG PO TABS: ORAL_TABLET | ORAL | 0 refills | Status: DC

## 2017-05-10 ENCOUNTER — Encounter: Payer: Self-pay | Admitting: Physician Assistant

## 2017-05-10 ENCOUNTER — Ambulatory Visit (INDEPENDENT_AMBULATORY_CARE_PROVIDER_SITE_OTHER): Payer: Self-pay | Admitting: Physician Assistant

## 2017-05-10 VITALS — BP 113/62 | HR 94 | Temp 97.2°F | Ht 65.0 in | Wt 160.0 lb

## 2017-05-10 DIAGNOSIS — L732 Hidradenitis suppurativa: Secondary | ICD-10-CM

## 2017-05-10 DIAGNOSIS — G500 Trigeminal neuralgia: Secondary | ICD-10-CM

## 2017-05-10 DIAGNOSIS — I10 Essential (primary) hypertension: Secondary | ICD-10-CM

## 2017-05-10 MED ORDER — HYDROCODONE-ACETAMINOPHEN 10-325 MG PO TABS: 1.0000 | ORAL_TABLET | Freq: Three times a day (TID) | ORAL | 0 refills | Status: DC

## 2017-05-10 MED ORDER — LOSARTAN POTASSIUM 25 MG PO TABS: ORAL_TABLET | ORAL | 3 refills | Status: DC

## 2017-05-10 MED ORDER — HYDROCODONE-ACETAMINOPHEN 10-325 MG PO TABS: 1.0000 | ORAL_TABLET | Freq: Four times a day (QID) | ORAL | 0 refills | Status: DC | PRN

## 2017-05-10 MED ORDER — HYDROCODONE-ACETAMINOPHEN 10-325 MG PO TABS: ORAL_TABLET | ORAL | 0 refills | Status: DC

## 2017-05-10 MED ORDER — AMLODIPINE BESYLATE 5 MG PO TABS: 5.0000 mg | ORAL_TABLET | Freq: Every day | ORAL | 3 refills | Status: DC

## 2017-05-10 NOTE — Progress Notes (Signed)
BP 113/62   Pulse 94   Temp 97.2 F (36.2 C) (Oral)   Ht 5\' 5"  (1.651 m)   Wt 160 lb (72.6 kg)   BMI 26.63 kg/m    Subjective:    Patient ID: Grace [name], female    DOB: 1961/02/10, 56 y.o.   MRN: [id number]  HPI: Grace [name] is a 56 y.o. female presenting on 05/10/2017 for Hypertension (pt here today for routine follow up of her chronic medical conditions and wants refills on her pain medications)  This patient comes in for periodic recheck on medications and conditions including trigeminal neuralgia, hidradenitis suppurativa, hypertension.  She is new to me from Dr. Hyacinth Meeker.  She has been in this practice for some time.   All medications are reviewed today. There are no reports of any problems with the medications. All of the medical conditions are reviewed and updated.  Lab work is reviewed and will be ordered as medically necessary. There are no new problems reported with today's visit.   Relevant past medical, surgical, family and social history reviewed and updated as indicated. Allergies and medications reviewed and updated.  Past Medical History:  Diagnosis Date  . Hidradenitis   . Hyperlipidemia   . Hypertension   . Tobacco user   . Trigeminal neuralgia   . Vitamin D deficiency     Past Surgical History:  Procedure Laterality Date  . CHOLECYSTECTOMY    . DILATION AND CURETTAGE OF UTERUS      Review of Systems  Constitutional: Negative.  Negative for activity change, fatigue and fever.  HENT: Negative.   Eyes: Negative.   Respiratory: Negative.  Negative for cough.   Cardiovascular: Negative.  Negative for chest pain.  Gastrointestinal: Negative.  Negative for abdominal pain.  Endocrine: Negative.   Genitourinary: Negative.  Negative for dysuria.  Musculoskeletal: Positive for arthralgias.  Skin: Negative.   Neurological: Positive for numbness and headaches.    Allergies as of 05/10/2017      Reactions   Prednisone    REACTION: face reddened, skin  hot to the touch, increased BP   Statins    Muscle aches   Tricor [fenofibrate]    Muscle aches      Medication List       Accurate as of 05/10/17 11:59 PM. Always use your most recent med list.          amLODipine 5 MG tablet Commonly known as:  NORVASC Take 1 tablet (5 mg total) by mouth daily.   aspirin EC 81 MG tablet Take 81 mg by mouth daily.   clindamycin 1 % lotion Commonly known as:  CLEOCIN T   HYDROcodone-acetaminophen 10-325 MG tablet Commonly known as:  NORCO TAKE ONE TABLET BY MOUTH QID DAILY AS NEEDED FOR  TRIGEMINAL  NEURALGIA   HYDROcodone-acetaminophen 10-325 MG tablet Commonly known as:  NORCO Take 1 tablet by mouth 4 (four) times daily as needed.   HYDROcodone-acetaminophen 10-325 MG tablet Commonly known as:  NORCO Take 1 tablet by mouth 4 (four) times daily as needed. Fill 60 days from original script date   losartan 25 MG tablet Commonly known as:  COZAAR TAKE 1 TABLET BY MOUTH EVERY DAY   Vitamin D 2000 units Caps Take 2,000 Units by mouth.          Objective:    BP 113/62   Pulse 94   Temp 97.2 F (36.2 C) (Oral)   Ht 5\' 5"  (1.651 m)   Wt  160 lb (72.6 kg)   BMI 26.63 kg/m   Allergies  Allergen Reactions  . Prednisone     REACTION: face reddened, skin hot to the touch, increased BP  . Statins     Muscle aches  . Tricor [Fenofibrate]     Muscle aches    Physical Exam  Constitutional: She is oriented to person, place, and time. She appears well-developed and well-nourished.  HENT:  Head: Normocephalic and atraumatic.  Right Ear: Tympanic membrane, external ear and ear canal normal.  Left Ear: Tympanic membrane, external ear and ear canal normal.  Nose: Nose normal. No rhinorrhea.  Mouth/Throat: Oropharynx is clear and moist and mucous membranes are normal. No oropharyngeal exudate or posterior oropharyngeal erythema.  Eyes: Conjunctivae and EOM are normal. Pupils are equal, round, and reactive to light.  Neck: Normal  range of motion. Neck supple.  Cardiovascular: Normal rate, regular rhythm, normal heart sounds and intact distal pulses.   Pulmonary/Chest: Effort normal and breath sounds normal.  Abdominal: Soft. Bowel sounds are normal.  Neurological: She is alert and oriented to person, place, and time. She has normal reflexes.  Skin: Skin is warm and dry. No rash noted.  Psychiatric: She has a normal mood and affect. Her behavior is normal. Judgment and thought content normal.  Nursing note and vitals reviewed.      Assessment & Plan:   1. Essential hypertension - losartan (COZAAR) 25 MG tablet; TAKE 1 TABLET BY MOUTH EVERY DAY  Dispense: 90 tablet; Refill: 3 - amLODipine (NORVASC) 5 MG tablet; Take 1 tablet (5 mg total) by mouth daily.  Dispense: 90 tablet; Refill: 3  2. Trigeminal neuralgia - HYDROcodone-acetaminophen (NORCO) 10-325 MG tablet; TAKE ONE TABLET BY MOUTH QID DAILY AS NEEDED FOR  TRIGEMINAL  NEURALGIA  Dispense: 120 tablet; Refill: 0 - HYDROcodone-acetaminophen (NORCO) 10-325 MG tablet; Take 1 tablet by mouth 4 (four) times daily as needed.  Dispense: 120 tablet; Refill: 0 - HYDROcodone-acetaminophen (NORCO) 10-325 MG tablet; Take 1 tablet by mouth 4 (four) times daily as needed. Fill 60 days from original script date  Dispense: 120 tablet; Refill: 0  3. Hidradenitis Call if any change   Continue all other maintenance medications as listed above.  Follow up plan: Return in about 3 months (around 08/10/2017) for recheck.  Educational handout given for hidradenitis suppurativa  Remus LofflerAngel S. Jlon Betker PA-C [facility] [address]  [place], [place] [id number] [phone]   05/13/2017, 12:20 PM

## 2017-05-10 NOTE — Patient Instructions (Signed)
Hidradenitis Suppurativa Hidradenitis suppurativa is a long-term (chronic) skin disease that starts with blocked sweat glands or hair follicles. Bacteria may grow in these blocked openings of your skin. Hidradenitis suppurativa is like a severe form of acne that develops in areas of your body where acne would be unusual. It is most likely to affect the areas of your body where skin rubs against skin and becomes moist. This includes your:  Underarms.  Groin.  Genital areas.  Buttocks.  Upper thighs.  Breasts. Hidradenitis suppurativa may start out with small pimples. The pimples can develop into deep sores that break open (rupture) and drain pus. Over time your skin may thicken and become scarred. Hidradenitis suppurativa cannot be passed from person to person. What are the causes? The exact cause of hidradenitis suppurativa is not known. This condition may be due to:  Female and female hormones. The condition is rare before and after puberty.  An overactive body defense system (immune system). Your immune system may overreact to the blocked hair follicles or sweat glands and cause swelling and pus-filled sores. What increases the risk? You may have a higher risk of hidradenitis suppurativa if you:  Are a woman.  Are between ages 11 and 55.  Have a family history of hidradenitis suppurativa.  Have a personal history of acne.  Are overweight.  Smoke.  Take the drug lithium. What are the signs or symptoms? The first signs of an outbreak are usually painful skin bumps that look like pimples. As the condition progresses:  Skin bumps may get bigger and grow deeper into the skin.  Bumps under the skin may rupture and drain smelly pus.  Skin may become itchy and infected.  Skin may thicken and scar.  Drainage may continue through tunnels under the skin (fistulas).  Walking and moving your arms can become painful. How is this diagnosed? Your health care provider may diagnose  hidradenitis suppurativa based on your medical history and your signs and symptoms. A physical exam will also be done. You may need to see a health care provider who specializes in skin diseases (dermatologist). You may also have tests done to confirm the diagnosis. These can include:  Swabbing a sample of pus or drainage from your skin so it can be sent to the lab and tested for infection.  Blood tests to check for infection. How is this treated? The same treatment will not work for everybody with hidradenitis suppurativa. Your treatment will depend on how severe your symptoms are. You may need to try several treatments to find what works best for you. Part of your treatment may include cleaning and bandaging (dressing) your wounds. You may also have to take medicines, such as the following:  Antibiotics.  Acne medicines.  Medicines to block or suppress the immune system.  A diabetes medicine (metformin) is sometimes used to treat this condition.  For women, birth control pills can sometimes help relieve symptoms. You may need surgery if you have a severe case of hidradenitis suppurativa that does not respond to medicine. Surgery may involve:  Using a laser to clear the skin and remove hair follicles.  Opening and draining deep sores.  Removing the areas of skin that are diseased and scarred. Follow these instructions at home:  Learn as much as you can about your disease, and work closely with your health care providers.  Take medicines only as directed by your health care provider.  If you were prescribed an antibiotic medicine, finish it all even   if you start to feel better.  If you are overweight, losing weight may be very helpful. Try to reach and maintain a healthy weight.  Do not use any tobacco products, including cigarettes, chewing tobacco, or electronic cigarettes. If you need help quitting, ask your health care provider.  Do not shave the areas where you get  hidradenitis suppurativa.  Do not wear deodorant.  Wear loose-fitting clothes.  Try not to overheat and get sweaty.  Take a daily bleach bath as directed by your health care provider.  Fill your bathtub halfway with water.  Pour in  cup of unscented household bleach.  Soak for 5-10 minutes.  Cover sore areas with a warm, clean washcloth (compress) for 5-10 minutes. Contact a health care provider if:  You have a flare-up of hidradenitis suppurativa.  You have chills or a fever.  You are having trouble controlling your symptoms at home. This information is not intended to replace advice given to you by your health care provider. Make sure you discuss any questions you have with your health care provider. Document Released: 07/31/2004 Document Revised: 05/24/2016 Document Reviewed: 03/19/2014 Elsevier Interactive Patient Education  2017 Elsevier Inc.  

## 2017-05-13 DIAGNOSIS — L732 Hidradenitis suppurativa: Secondary | ICD-10-CM | POA: Insufficient documentation

## 2017-05-16 ENCOUNTER — Ambulatory Visit: Payer: BLUE CROSS/BLUE SHIELD | Admitting: Physician Assistant

## 2017-08-12 ENCOUNTER — Encounter: Payer: Self-pay | Admitting: Physician Assistant

## 2017-08-12 ENCOUNTER — Ambulatory Visit (INDEPENDENT_AMBULATORY_CARE_PROVIDER_SITE_OTHER): Payer: Self-pay | Admitting: Physician Assistant

## 2017-08-12 DIAGNOSIS — I10 Essential (primary) hypertension: Secondary | ICD-10-CM

## 2017-08-12 DIAGNOSIS — G500 Trigeminal neuralgia: Secondary | ICD-10-CM

## 2017-08-12 MED ORDER — HYDROCODONE-ACETAMINOPHEN 10-325 MG PO TABS: 1.0000 | ORAL_TABLET | Freq: Four times a day (QID) | ORAL | 0 refills | Status: DC | PRN

## 2017-08-12 MED ORDER — AMLODIPINE BESYLATE 5 MG PO TABS: 5.0000 mg | ORAL_TABLET | Freq: Every day | ORAL | 3 refills | Status: DC

## 2017-08-12 MED ORDER — CLINDAMYCIN PHOSPHATE 1 % EX LOTN: TOPICAL_LOTION | Freq: Two times a day (BID) | CUTANEOUS | 5 refills | Status: DC

## 2017-08-12 MED ORDER — HYDROCODONE-ACETAMINOPHEN 10-325 MG PO TABS: ORAL_TABLET | ORAL | 0 refills | Status: DC

## 2017-08-12 MED ORDER — LOSARTAN POTASSIUM 25 MG PO TABS: ORAL_TABLET | ORAL | 3 refills | Status: DC

## 2017-08-12 NOTE — Patient Instructions (Signed)
In a few days you may receive a survey in the mail or online from [employer] regarding your visit with us today. Please take a moment to fill this out. Your feedback is very important to our whole office. It can help us better understand your needs as well as improve your experience and satisfaction. Thank you for taking your time to complete it. We care about you.  Talyn Dessert, PA-C  

## 2017-08-12 NOTE — Progress Notes (Signed)
BP (!) 121/58   Pulse 83   Temp 98.4 F (36.9 C) (Oral)   Ht 5\' 5"  (1.651 m)   Wt 160 lb (72.6 kg)   BMI 26.63 kg/m    Subjective:    Patient ID: Grace [name], female    DOB: 01/20/1961, 56 y.o.   MRN: [id number]  HPI: Grace [name] is a 56 y.o. female presenting on 08/12/2017 for Follow-up (3 month )  This patient comes in for periodic recheck on medications and conditions including Chronic pain due to trigeminal neuralgia, hypertension, hidradenitis. She reports that she's been doing very well over the past 3 months. She is very well controlled on her medications. Her blood pressures been quite good. She's had no exacerbations of her skin problems.   All medications are reviewed today. There are no reports of any problems with the medications. All of the medical conditions are reviewed and updated.  Lab work is reviewed and will be ordered as medically necessary. There are no new problems reported with today's visit.   Relevant past medical, surgical, family and social history reviewed and updated as indicated. Allergies and medications reviewed and updated.  Past Medical History:  Diagnosis Date  . Hidradenitis   . Hyperlipidemia   . Hypertension   . Tobacco user   . Trigeminal neuralgia   . Vitamin D deficiency     Past Surgical History:  Procedure Laterality Date  . CHOLECYSTECTOMY    . DILATION AND CURETTAGE OF UTERUS      Review of Systems  Constitutional: Negative.   HENT: Negative.   Eyes: Negative.   Respiratory: Negative.   Gastrointestinal: Negative.   Genitourinary: Negative.     Allergies as of 08/12/2017      Reactions   Prednisone    REACTION: face reddened, skin hot to the touch, increased BP   Statins    Muscle aches   Tricor [fenofibrate]    Muscle aches      Medication List       Accurate as of 08/12/17  2:23 PM. Always use your most recent med list.          amLODipine 5 MG tablet Commonly known as:  NORVASC Take 1 tablet (5  mg total) by mouth daily.   aspirin EC 81 MG tablet Take 81 mg by mouth daily.   clindamycin 1 % lotion Commonly known as:  CLEOCIN T Apply topically 2 (two) times daily.   HYDROcodone-acetaminophen 10-325 MG tablet Commonly known as:  NORCO TAKE ONE TABLET BY MOUTH QID DAILY AS NEEDED FOR  TRIGEMINAL  NEURALGIA   HYDROcodone-acetaminophen 10-325 MG tablet Commonly known as:  NORCO Take 1 tablet by mouth 4 (four) times daily as needed.   HYDROcodone-acetaminophen 10-325 MG tablet Commonly known as:  NORCO Take 1 tablet by mouth 4 (four) times daily as needed. Fill 60 days from original script date   losartan 25 MG tablet Commonly known as:  COZAAR TAKE 1 TABLET BY MOUTH EVERY DAY   Vitamin D 2000 units Caps Take 2,000 Units by mouth.          Objective:    BP (!) 121/58   Pulse 83   Temp 98.4 F (36.9 C) (Oral)   Ht 5\' 5"  (1.651 m)   Wt 160 lb (72.6 kg)   BMI 26.63 kg/m   Allergies  Allergen Reactions  . Prednisone     REACTION: face reddened, skin hot to the touch, increased BP  .  Statins     Muscle aches  . Tricor [Fenofibrate]     Muscle aches    Physical Exam  Constitutional: She is oriented to person, place, and time. She appears well-developed and well-nourished.  HENT:  Head: Normocephalic and atraumatic.  Eyes: Pupils are equal, round, and reactive to light. Conjunctivae and EOM are normal.  Cardiovascular: Normal rate, regular rhythm, normal heart sounds and intact distal pulses.   Pulmonary/Chest: Effort normal and breath sounds normal.  Abdominal: Soft. Bowel sounds are normal.  Neurological: She is alert and oriented to person, place, and time. She has normal reflexes.  Skin: Skin is warm and dry. No rash noted.  Psychiatric: She has a normal mood and affect. Her behavior is normal. Judgment and thought content normal.  Nursing note and vitals reviewed.   Results for orders placed or performed in visit on 03/02/16  HM MAMMOGRAPHY  Result  Value Ref Range   HM Mammogram Negative       Assessment & Plan:   1. Trigeminal neuralgia - HYDROcodone-acetaminophen (NORCO) 10-325 MG tablet; TAKE ONE TABLET BY MOUTH QID DAILY AS NEEDED FOR  TRIGEMINAL  NEURALGIA  Dispense: 120 tablet; Refill: 0 - HYDROcodone-acetaminophen (NORCO) 10-325 MG tablet; Take 1 tablet by mouth 4 (four) times daily as needed.  Dispense: 120 tablet; Refill: 0 - HYDROcodone-acetaminophen (NORCO) 10-325 MG tablet; Take 1 tablet by mouth 4 (four) times daily as needed. Fill 60 days from original script date  Dispense: 120 tablet; Refill: 0  2. Essential hypertension - amLODipine (NORVASC) 5 MG tablet; Take 1 tablet (5 mg total) by mouth daily.  Dispense: 90 tablet; Refill: 3 - losartan (COZAAR) 25 MG tablet; TAKE 1 TABLET BY MOUTH EVERY DAY  Dispense: 90 tablet; Refill: 3    Current Outpatient Prescriptions:  .  amLODipine (NORVASC) 5 MG tablet, Take 1 tablet (5 mg total) by mouth daily., Disp: 90 tablet, Rfl: 3 .  aspirin EC 81 MG tablet, Take 81 mg by mouth daily., Disp: , Rfl:  .  Cholecalciferol (VITAMIN D) 2000 UNITS CAPS, Take 2,000 Units by mouth., Disp: , Rfl:  .  clindamycin (CLEOCIN T) 1 % lotion, Apply topically 2 (two) times daily., Disp: 60 mL, Rfl: 5 .  HYDROcodone-acetaminophen (NORCO) 10-325 MG tablet, TAKE ONE TABLET BY MOUTH QID DAILY AS NEEDED FOR  TRIGEMINAL  NEURALGIA, Disp: 120 tablet, Rfl: 0 .  HYDROcodone-acetaminophen (NORCO) 10-325 MG tablet, Take 1 tablet by mouth 4 (four) times daily as needed., Disp: 120 tablet, Rfl: 0 .  HYDROcodone-acetaminophen (NORCO) 10-325 MG tablet, Take 1 tablet by mouth 4 (four) times daily as needed. Fill 60 days from original script date, Disp: 120 tablet, Rfl: 0 .  losartan (COZAAR) 25 MG tablet, TAKE 1 TABLET BY MOUTH EVERY DAY, Disp: 90 tablet, Rfl: 3 Continue all other maintenance medications as listed above.  Follow up plan: Return in about 3 months (around 11/12/2017) for recheck.  Educational  handout given for survey  Remus Loffler PA-C [facility] [address]  [place], [place] [address] [phone]   08/12/2017, 2:23 PM

## 2017-10-22 IMAGING — DX DG KNEE 1-2V*L*
2 series · 2 of 2 positions shown · non-contrast
Comparison: None.

CLINICAL DATA: Left knee pain

EXAM:
LEFT KNEE - 1-2 VIEW

[knee ap]
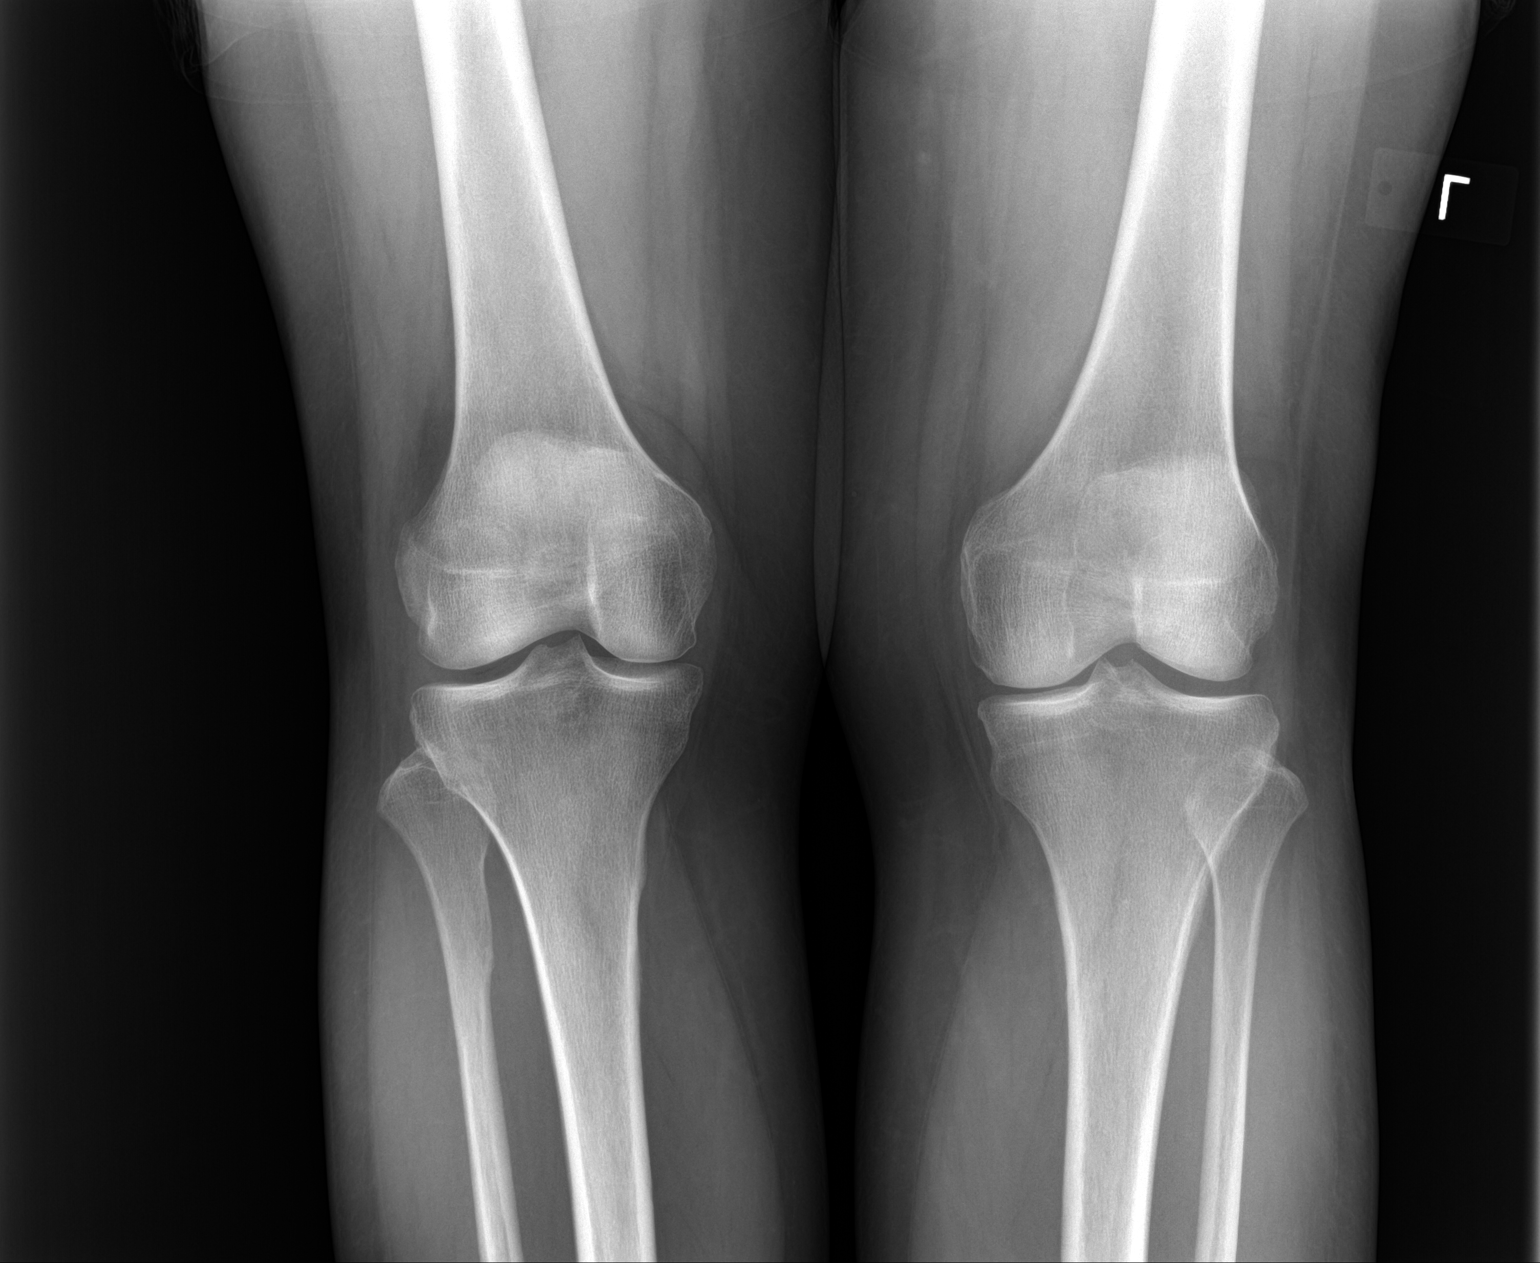

[knee lat]
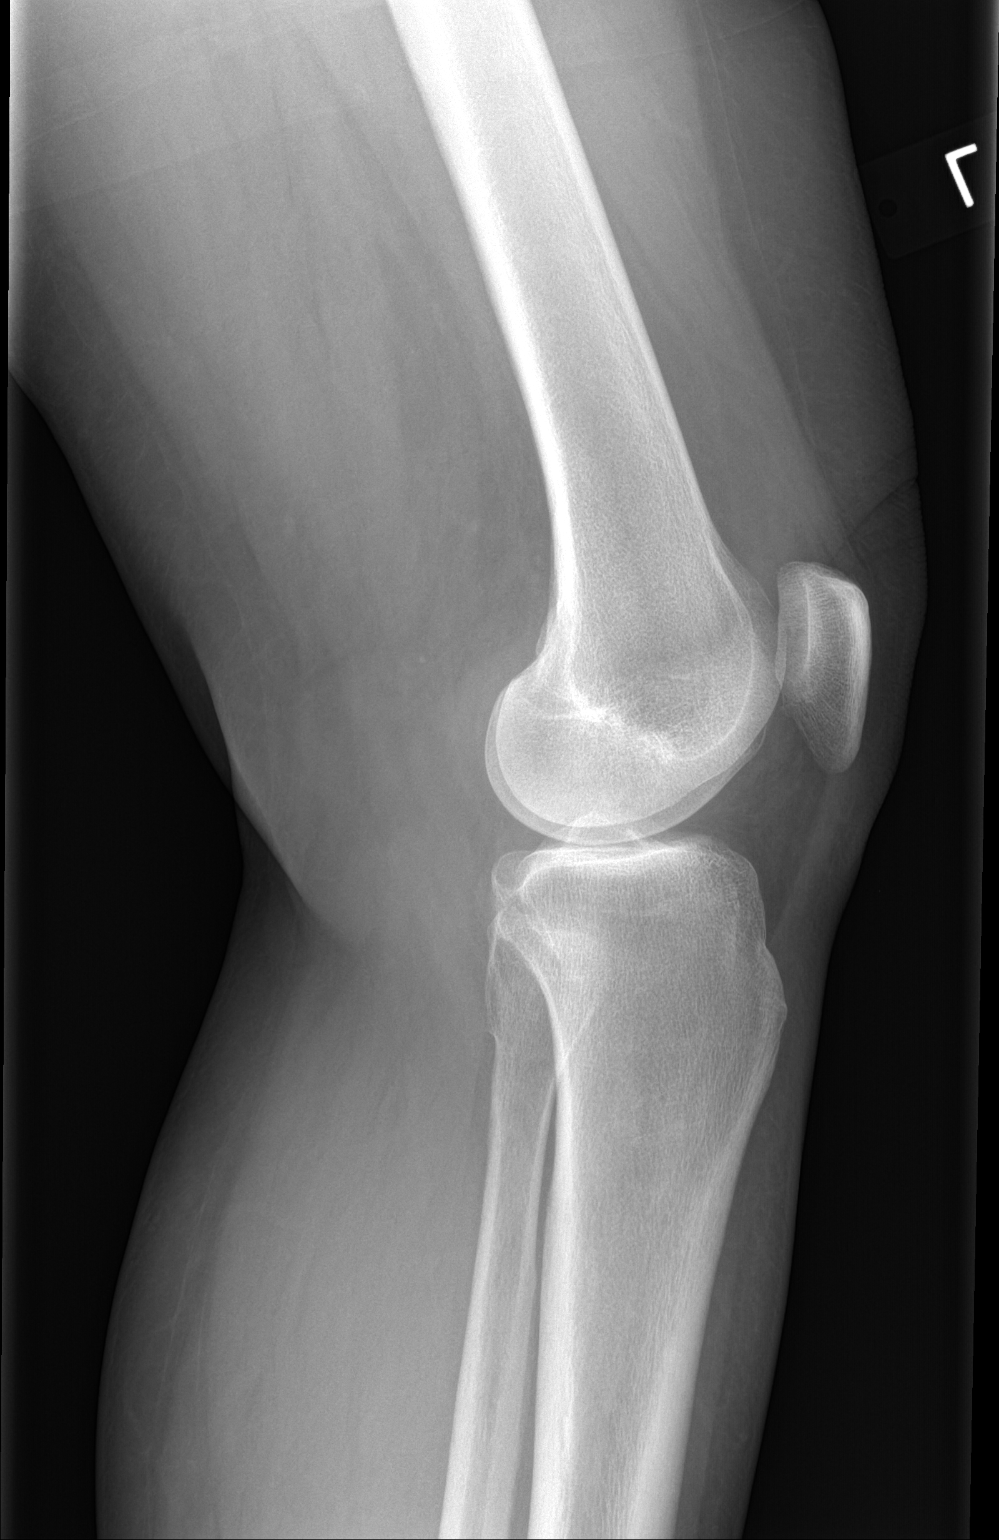

[2 of 2 positions shown; findings below may reference images not displayed]

FINDINGS: No fracture or dislocation is seen.

Bilateral knee joint spaces are preserved on the standing frontal
view.

Possible small left knee joint effusion.
IMPRESSION: No fracture or dislocation is seen.

No acute osseus abnormality is seen.

Possible small left knee joint effusion.

## 2017-11-08 ENCOUNTER — Encounter: Payer: Self-pay | Admitting: Physician Assistant

## 2017-11-08 ENCOUNTER — Ambulatory Visit (INDEPENDENT_AMBULATORY_CARE_PROVIDER_SITE_OTHER): Payer: 59 | Admitting: Physician Assistant

## 2017-11-08 DIAGNOSIS — G500 Trigeminal neuralgia: Secondary | ICD-10-CM | POA: Diagnosis not present

## 2017-11-08 MED ORDER — HYDROCODONE-ACETAMINOPHEN 10-325 MG PO TABS: 1.0000 | ORAL_TABLET | Freq: Four times a day (QID) | ORAL | 0 refills | Status: DC | PRN

## 2017-11-08 MED ORDER — HYDROCODONE-ACETAMINOPHEN 10-325 MG PO TABS: ORAL_TABLET | ORAL | 0 refills | Status: DC

## 2017-11-08 NOTE — Patient Instructions (Signed)
In a few days you may receive a survey in the mail or online from [employer] regarding your visit with us today. Please take a moment to fill this out. Your feedback is very important to our whole office. It can help us better understand your needs as well as improve your experience and satisfaction. Thank you for taking your time to complete it. We care about you.  Rasheen Schewe, PA-C  

## 2017-11-08 NOTE — Progress Notes (Signed)
BP 114/60   Pulse 78   Temp (!) 97.3 F (36.3 C) (Oral)   Ht 5\' 5"  (1.651 m)   Wt 158 lb 3.2 oz (71.8 kg)   BMI 26.33 kg/m    Subjective:    Patient ID: Grace [name], female    DOB: 12/02/1961, 56 y.o.   MRN: [id number]  HPI: Grace [name] is a 56 y.o. female presenting on 11/08/2017 for Follow-up (3 month )  This patient comes in for periodic recheck on medications and conditions including recheck on her trigeminal neuralgia.  Overall she has been stable.  She is having a lot of stress related to finances.  She has not been able to get much employment..   All medications are reviewed today. There are no reports of any problems with the medications. All of the medical conditions are reviewed and updated.  Lab work is reviewed and will be ordered as medically necessary. There are no new problems reported with today's visit.   Relevant past medical, surgical, family and social history reviewed and updated as indicated. Allergies and medications reviewed and updated.  Past Medical History:  Diagnosis Date  . Hidradenitis   . Hyperlipidemia   . Hypertension   . Tobacco user   . Trigeminal neuralgia   . Vitamin D deficiency     Past Surgical History:  Procedure Laterality Date  . CHOLECYSTECTOMY    . DILATION AND CURETTAGE OF UTERUS      Review of Systems  Constitutional: Negative.   HENT: Negative.   Eyes: Negative.   Respiratory: Negative.   Gastrointestinal: Negative.   Genitourinary: Negative.     Allergies as of 11/08/2017      Reactions   Prednisone    REACTION: face reddened, skin hot to the touch, increased BP   Statins    Muscle aches   Tricor [fenofibrate]    Muscle aches      Medication List        Accurate as of 11/08/17  1:57 PM. Always use your most recent med list.          amLODipine 5 MG tablet Commonly known as:  NORVASC Take 1 tablet (5 mg total) by mouth daily.   aspirin EC 81 MG tablet Take 81 mg by mouth daily.   clindamycin  1 % lotion Commonly known as:  CLEOCIN T Apply topically 2 (two) times daily.   HYDROcodone-acetaminophen 10-325 MG tablet Commonly known as:  NORCO TAKE ONE TABLET BY MOUTH QID DAILY AS NEEDED FOR  TRIGEMINAL  NEURALGIA   HYDROcodone-acetaminophen 10-325 MG tablet Commonly known as:  NORCO Take 1 tablet 4 (four) times daily as needed by mouth.   HYDROcodone-acetaminophen 10-325 MG tablet Commonly known as:  NORCO Take 1 tablet 4 (four) times daily as needed by mouth. Fill 60 days from original script date   losartan 25 MG tablet Commonly known as:  COZAAR TAKE 1 TABLET BY MOUTH EVERY DAY   Vitamin D 2000 units Caps Take 2,000 Units by mouth.          Objective:    BP 114/60   Pulse 78   Temp (!) 97.3 F (36.3 C) (Oral)   Ht 5\' 5"  (1.651 m)   Wt 158 lb 3.2 oz (71.8 kg)   BMI 26.33 kg/m   Allergies  Allergen Reactions  . Prednisone     REACTION: face reddened, skin hot to the touch, increased BP  . Statins     Muscle  aches  . Tricor [Fenofibrate]     Muscle aches    Physical Exam  Constitutional: She is oriented to person, place, and time. She appears well-developed and well-nourished.  HENT:  Head: Normocephalic and atraumatic.  Eyes: Conjunctivae and EOM are normal. Pupils are equal, round, and reactive to light.  Cardiovascular: Normal rate, regular rhythm, normal heart sounds and intact distal pulses.  Pulmonary/Chest: Effort normal and breath sounds normal.  Abdominal: Soft. Bowel sounds are normal.  Neurological: She is alert and oriented to person, place, and time. She has normal reflexes.  Skin: Skin is warm and dry. No rash noted.  Psychiatric: She has a normal mood and affect. Her behavior is normal. Judgment and thought content normal.  Nursing note and vitals reviewed.       Assessment & Plan:   1. Trigeminal neuralgia  - HYDROcodone-acetaminophen (NORCO) 10-325 MG tablet; TAKE ONE TABLET BY MOUTH QID DAILY AS NEEDED FOR  TRIGEMINAL   NEURALGIA  Dispense: 120 tablet; Refill: 0 - HYDROcodone-acetaminophen (NORCO) 10-325 MG tablet; Take 1 tablet 4 (four) times daily as needed by mouth.  Dispense: 120 tablet; Refill: 0 - HYDROcodone-acetaminophen (NORCO) 10-325 MG tablet; Take 1 tablet 4 (four) times daily as needed by mouth. Fill 60 days from original script date  Dispense: 120 tablet; Refill: 0    Current Outpatient Medications:  .  amLODipine (NORVASC) 5 MG tablet, Take 1 tablet (5 mg total) by mouth daily., Disp: 90 tablet, Rfl: 3 .  aspirin EC 81 MG tablet, Take 81 mg by mouth daily., Disp: , Rfl:  .  Cholecalciferol (VITAMIN D) 2000 UNITS CAPS, Take 2,000 Units by mouth., Disp: , Rfl:  .  clindamycin (CLEOCIN T) 1 % lotion, Apply topically 2 (two) times daily., Disp: 60 mL, Rfl: 5 .  HYDROcodone-acetaminophen (NORCO) 10-325 MG tablet, TAKE ONE TABLET BY MOUTH QID DAILY AS NEEDED FOR  TRIGEMINAL  NEURALGIA, Disp: 120 tablet, Rfl: 0 .  HYDROcodone-acetaminophen (NORCO) 10-325 MG tablet, Take 1 tablet 4 (four) times daily as needed by mouth., Disp: 120 tablet, Rfl: 0 .  HYDROcodone-acetaminophen (NORCO) 10-325 MG tablet, Take 1 tablet 4 (four) times daily as needed by mouth. Fill 60 days from original script date, Disp: 120 tablet, Rfl: 0 .  losartan (COZAAR) 25 MG tablet, TAKE 1 TABLET BY MOUTH EVERY DAY, Disp: 90 tablet, Rfl: 3 Continue all other maintenance medications as listed above.  Follow up plan: Return in about 3 months (around 02/08/2018) for recheck.  Educational handout given for survey  Remus LofflerAngel S. Aalaiyah Yassin PA-C [facility] [address]  [place], [place] [id number] [phone]   11/08/2017, 1:57 PM

## 2017-11-12 ENCOUNTER — Ambulatory Visit: Payer: Self-pay | Admitting: Physician Assistant

## 2017-12-26 ENCOUNTER — Encounter: Payer: Self-pay | Admitting: *Deleted

## 2018-02-10 ENCOUNTER — Encounter: Payer: Self-pay | Admitting: Physician Assistant

## 2018-02-10 ENCOUNTER — Ambulatory Visit (INDEPENDENT_AMBULATORY_CARE_PROVIDER_SITE_OTHER): Payer: 59 | Admitting: Physician Assistant

## 2018-02-10 VITALS — BP 115/60 | HR 89 | Temp 97.0°F | Ht 65.0 in | Wt 160.4 lb

## 2018-02-10 DIAGNOSIS — G500 Trigeminal neuralgia: Secondary | ICD-10-CM | POA: Diagnosis not present

## 2018-02-10 DIAGNOSIS — G8929 Other chronic pain: Secondary | ICD-10-CM | POA: Diagnosis not present

## 2018-02-10 DIAGNOSIS — M25561 Pain in right knee: Secondary | ICD-10-CM | POA: Diagnosis not present

## 2018-02-10 MED ORDER — HYDROCODONE-ACETAMINOPHEN 10-325 MG PO TABS: ORAL_TABLET | ORAL | 0 refills | Status: DC

## 2018-02-10 MED ORDER — HYDROCODONE-ACETAMINOPHEN 10-325 MG PO TABS: 1.0000 | ORAL_TABLET | Freq: Four times a day (QID) | ORAL | 0 refills | Status: DC | PRN

## 2018-02-10 NOTE — Progress Notes (Signed)
BP 115/60   Pulse 89   Temp (!) 97 F (36.1 C) (Oral)   Ht 5\' 5"  (1.651 m)   Wt 160 lb 6.4 oz (72.8 kg)   BMI 26.69 kg/m    Subjective:    Patient ID: Grace [name], female    DOB: 1961-11-24, 57 y.o.   MRN: [id number]  HPI: DAWNYEL LEVEN is a 57 y.o. female presenting on 02/10/2018 for Follow-up (3 month ) Patient comes in for 25-month follow-up on her chronic pain related to her trigeminal neuralgia.  Overall she is done well with her current medications.  She has not had any breakthrough issues with this.  She is having pain in the right knee at this time.  She had problems in the left knee in the past.  Injections helped him greatly.  Now the right knee is doing the same thing.  She would like to get this taken care of with an injection here at our office.  Past Medical History:  Diagnosis Date  . Hidradenitis   . Hyperlipidemia   . Hypertension   . Tobacco user   . Trigeminal neuralgia   . Vitamin D deficiency    Relevant past medical, surgical, family and social history reviewed and updated as indicated. Interim medical history since our last visit reviewed. Allergies and medications reviewed and updated. DATA REVIEWED: CHART IN EPIC  Family History reviewed for pertinent findings.  Review of Systems  Constitutional: Negative.  Negative for activity change, fatigue and fever.  HENT: Negative.   Eyes: Negative.   Respiratory: Negative.  Negative for cough.   Cardiovascular: Negative.  Negative for chest pain.  Gastrointestinal: Negative.  Negative for abdominal pain.  Endocrine: Negative.   Genitourinary: Negative.  Negative for dysuria.  Musculoskeletal: Positive for arthralgias and joint swelling.  Skin: Negative.   Neurological: Negative.     Allergies as of 02/10/2018      Reactions   Prednisone    REACTION: face reddened, skin hot to the touch, increased BP   Statins    Muscle aches   Tricor [fenofibrate]    Muscle aches      Medication List       Accurate as of 02/10/18  3:35 PM. Always use your most recent med list.          amLODipine 5 MG tablet Commonly known as:  NORVASC Take 1 tablet (5 mg total) by mouth daily.   aspirin EC 81 MG tablet Take 81 mg by mouth daily.   clindamycin 1 % lotion Commonly known as:  CLEOCIN T Apply topically 2 (two) times daily.   HYDROcodone-acetaminophen 10-325 MG tablet Commonly known as:  NORCO TAKE ONE TABLET BY MOUTH QID DAILY AS NEEDED FOR  TRIGEMINAL  NEURALGIA   HYDROcodone-acetaminophen 10-325 MG tablet Commonly known as:  NORCO Take 1 tablet by mouth 4 (four) times daily as needed.   HYDROcodone-acetaminophen 10-325 MG tablet Commonly known as:  NORCO Take 1 tablet by mouth 4 (four) times daily as needed. Fill 60 days from original script date   losartan 25 MG tablet Commonly known as:  COZAAR TAKE 1 TABLET BY MOUTH EVERY DAY   Vitamin D 2000 units Caps Take 2,000 Units by mouth.          Objective:    BP 115/60   Pulse 89   Temp (!) 97 F (36.1 C) (Oral)   Ht 5\' 5"  (1.651 m)   Wt 160 lb 6.4 oz (72.8 kg)  BMI 26.69 kg/m   Allergies  Allergen Reactions  . Prednisone     REACTION: face reddened, skin hot to the touch, increased BP  . Statins     Muscle aches  . Tricor [Fenofibrate]     Muscle aches    Wt Readings from Last 3 Encounters:  02/10/18 160 lb 6.4 oz (72.8 kg)  11/08/17 158 lb 3.2 oz (71.8 kg)  08/12/17 160 lb (72.6 kg)    Physical Exam  Constitutional: She is oriented to person, place, and time. She appears well-developed and well-nourished.  HENT:  Head: Normocephalic and atraumatic.  Eyes: Conjunctivae and EOM are normal. Pupils are equal, round, and reactive to light.  Cardiovascular: Normal rate, regular rhythm, normal heart sounds and intact distal pulses.  Pulmonary/Chest: Effort normal and breath sounds normal.  Abdominal: Soft. Bowel sounds are normal.  Musculoskeletal:       Right knee: She exhibits decreased range of  motion and effusion.  Neurological: She is alert and oriented to person, place, and time. She has normal reflexes.  Skin: Skin is warm and dry. No rash noted.  Psychiatric: She has a normal mood and affect. Her behavior is normal. Judgment and thought content normal.        Assessment & Plan:   1. Trigeminal neuralgia - HYDROcodone-acetaminophen (NORCO) 10-325 MG tablet; TAKE ONE TABLET BY MOUTH QID DAILY AS NEEDED FOR  TRIGEMINAL  NEURALGIA  Dispense: 120 tablet; Refill: 0 - HYDROcodone-acetaminophen (NORCO) 10-325 MG tablet; Take 1 tablet by mouth 4 (four) times daily as needed.  Dispense: 120 tablet; Refill: 0 - HYDROcodone-acetaminophen (NORCO) 10-325 MG tablet; Take 1 tablet by mouth 4 (four) times daily as needed. Fill 60 days from original script date  Dispense: 120 tablet; Refill: 0  2. Chronic pain of right knee Make appointment in coming months with [facility] SabinsGottschalk, Bradshaw or [facility] for knee injection. Cash pay, needs to save.    Continue all other maintenance medications as listed above.  Follow up plan: Return if symptoms worsen or fail to improve.  Educational handout given for survey  Remus LofflerAngel S. Grant Henkes PA-C [facility] [address]  [place], [place] [address] [phone]   02/10/2018, 3:35 PM

## 2018-02-10 NOTE — Patient Instructions (Signed)
In a few days you may receive a survey in the mail or online from [employer] regarding your visit with us today. Please take a moment to fill this out. Your feedback is very important to our whole office. It can help us better understand your needs as well as improve your experience and satisfaction. Thank you for taking your time to complete it. We care about you.  Prudy FeelerAngel Kaydon Creedon, PA-C    [facility]  [facility]  [facility]

## 2018-03-07 ENCOUNTER — Telehealth: Payer: Self-pay | Admitting: Physician Assistant

## 2018-03-07 MED ORDER — OLMESARTAN MEDOXOMIL 20 MG PO TABS: 10.0000 mg | ORAL_TABLET | Freq: Every day | ORAL | 1 refills | Status: DC

## 2018-03-07 NOTE — Telephone Encounter (Signed)
Patient aware.

## 2018-03-07 NOTE — Telephone Encounter (Signed)
Covering PCP please review and advise  

## 2018-03-07 NOTE — Telephone Encounter (Signed)
Covering for PCP  Losartan ( 25mg )  is recalled, change to olmesartan at equivalent  Dose ( 10 mg)  Murtis SinkSam Bradshaw, MD [facility] 03/07/2018, 4:02 PM

## 2018-03-12 ENCOUNTER — Telehealth: Payer: Self-pay | Admitting: *Deleted

## 2018-03-12 MED ORDER — TELMISARTAN 20 MG PO TABS: 20.0000 mg | ORAL_TABLET | Freq: Every day | ORAL | 0 refills | Status: DC

## 2018-03-12 NOTE — Telephone Encounter (Signed)
Pharmacy advised patient to not cut the olmesartan in half since it is not scored. Patient would like medication changed? Per Lawanna KobusAngel switch to micardis 20mg  daily. Rx sent to pharmacy.

## 2018-05-12 ENCOUNTER — Encounter: Payer: Self-pay | Admitting: Physician Assistant

## 2018-05-12 ENCOUNTER — Ambulatory Visit (INDEPENDENT_AMBULATORY_CARE_PROVIDER_SITE_OTHER): Payer: 59 | Admitting: Physician Assistant

## 2018-05-12 VITALS — BP 116/59 | HR 97 | Temp 98.3°F | Ht 65.0 in | Wt 162.0 lb

## 2018-05-12 DIAGNOSIS — G500 Trigeminal neuralgia: Secondary | ICD-10-CM

## 2018-05-12 DIAGNOSIS — I10 Essential (primary) hypertension: Secondary | ICD-10-CM

## 2018-05-12 MED ORDER — HYDROCODONE-ACETAMINOPHEN 10-325 MG PO TABS: 1.0000 | ORAL_TABLET | Freq: Four times a day (QID) | ORAL | 0 refills | Status: DC | PRN

## 2018-05-12 MED ORDER — LOSARTAN POTASSIUM 25 MG PO TABS: 25.0000 mg | ORAL_TABLET | Freq: Every day | ORAL | 3 refills | Status: DC

## 2018-05-12 MED ORDER — AMLODIPINE BESYLATE 5 MG PO TABS: 5.0000 mg | ORAL_TABLET | Freq: Every day | ORAL | 3 refills | Status: DC

## 2018-05-12 MED ORDER — HYDROCODONE-ACETAMINOPHEN 10-325 MG PO TABS: ORAL_TABLET | ORAL | 0 refills | Status: DC

## 2018-05-12 NOTE — Progress Notes (Signed)
 D_[id number]$5\' 5"   Subjective:    Patient ID: Grace [name], female    DOB: 02/09/1961, 57 y.o.   MRN: [id number]  HPI: Grace [name] is a 57 y.o. female presenting on 05/12/2018 for hypertension and pain.  This patient comes in for 24-month recheck on her hypertension and trigeminal neuralgia.  She is not having any new complaints at this time.  Her blood pressure is well controlled on her current regimen.  She is tolerating all of her medications very well we will plan to recheck her in 3 months.  Past Medical History:  Diagnosis Date  . Hidradenitis   . Hyperlipidemia   . Hypertension   . Tobacco user   . Trigeminal neuralgia   . Vitamin D deficiency    Relevant past medical, surgical, family and social history reviewed and updated as indicated. Interim medical history since our last visit reviewed. Allergies and medications reviewed and updated. DATA REVIEWED: CHART IN EPIC  Family History reviewed for pertinent findings.  Review of Systems  Constitutional: Negative.   HENT: Negative.   Eyes: Negative.   Respiratory: Negative.   Gastrointestinal: Negative.   Genitourinary: Negative.     Allergies as of 05/12/2018      Reactions   Prednisone    REACTION: face reddened, skin hot to the touch, increased BP   Statins    Muscle aches   Tricor [fenofibrate]    Muscle aches      Medication List        Accurate as of 05/12/18  8:59 AM. Always use your most recent med list.          amLODipine 5 MG tablet Commonly known as:  NORVASC Take 1 tablet (5 mg total) by mouth daily.   aspirin EC 81 MG tablet Take 81 mg by mouth daily.   clindamycin 1 % lotion Commonly known as:  CLEOCIN T Apply topically 2 (two) times daily.   HYDROcodone-acetaminophen 10-325 MG tablet Commonly known as:  NORCO TAKE ONE TABLET BY MOUTH QID DAILY AS  NEEDED FOR  TRIGEMINAL  NEURALGIA   HYDROcodone-acetaminophen 10-325 MG tablet Commonly known as:  NORCO Take 1 tablet by mouth 4 (four) times daily as needed.   HYDROcodone-acetaminophen 10-325 MG tablet Commonly known as:  NORCO Take 1 tablet by mouth 4 (four) times daily as needed. Fill 60 days from original script date   losartan 25 MG tablet Commonly known as:  COZAAR Take 1 tablet (25 mg total) by mouth daily.   Vitamin D 2000 units Caps Take 2,000 Units by mouth.          Objective:    BP (!) 116/59 (BP Location: Left Arm)   Pulse 97   Temp 98.3 F (36.8 C) (Oral)   Ht  (1.651 m)   Wt 162 lb (73.5 kg)   BMI 26.96 kg/m   Allergies  Allergen Reactions  . Prednisone     REACTION: face reddened, skin hot to the touch, increased BP  . Statins     Muscle aches  . Tricor [Fenofibrate]     Muscle aches    Wt Readings from Last 3 Encounters:  05/12/18 162 lb (73.5 kg)  02/10/18 160 lb 6.4 oz (72.8 kg)  11/08/17 158 lb  3.2 oz (71.8 kg)    Physical Exam  Constitutional: She is oriented to person, place, and time. She appears well-developed and well-nourished.  HENT:  Head: Normocephalic and atraumatic.  Eyes: Pupils are equal, round, and reactive to light. Conjunctivae and EOM are normal.  Cardiovascular: Normal rate, regular rhythm, normal heart sounds and intact distal pulses.  Pulmonary/Chest: Effort normal and breath sounds normal.  Abdominal: Soft. Bowel sounds are normal.  Neurological: She is alert and oriented to person, place, and time. She has normal reflexes.  Skin: Skin is warm and dry. No rash noted.  Psychiatric: She has a normal mood and affect. Her behavior is normal. Judgment and thought content normal.    Results for orders placed or performed in visit on 03/02/16  HM MAMMOGRAPHY  Result Value Ref Range   HM Mammogram Negative       Assessment & Plan:   1. Essential hypertension - losartan (COZAAR) 25 MG tablet; Take 1 tablet (25  mg total) by mouth daily.  Dispense: 90 tablet; Refill: 3 - amLODipine (NORVASC) 5 MG tablet; Take 1 tablet (5 mg total) by mouth daily.  Dispense: 90 tablet; Refill: 3  2. Trigeminal neuralgia - HYDROcodone-acetaminophen (NORCO) 10-325 MG tablet; TAKE ONE TABLET BY MOUTH QID DAILY AS NEEDED FOR  TRIGEMINAL  NEURALGIA  Dispense: 120 tablet; Refill: 0 - HYDROcodone-acetaminophen (NORCO) 10-325 MG tablet; Take 1 tablet by mouth 4 (four) times daily as needed.  Dispense: 120 tablet; Refill: 0 - HYDROcodone-acetaminophen (NORCO) 10-325 MG tablet; Take 1 tablet by mouth 4 (four) times daily as needed. Fill 60 days from original script date  Dispense: 120 tablet; Refill: 0    Continue all other maintenance medications as listed above.  Follow up plan: Return in about 3 months (around 08/12/2018) for recheck.  Educational handout given for survey  Remus Loffler PA-C [facility] [address]  [place], [place] [address] [phone]   05/12/2018, 8:59 AM

## 2018-05-12 NOTE — Patient Instructions (Signed)
In a few days you may receive a survey in the mail or online from [employer] regarding your visit with us today. Please take a moment to fill this out. Your feedback is very important to our whole office. It can help us better understand your needs as well as improve your experience and satisfaction. Thank you for taking your time to complete it. We care about you.  Callum Wolf, PA-C  

## 2018-06-24 ENCOUNTER — Encounter: Payer: Self-pay | Admitting: *Deleted

## 2018-07-08 ENCOUNTER — Telehealth: Payer: Self-pay | Admitting: Physician Assistant

## 2018-07-09 NOTE — Telephone Encounter (Signed)
Patient aware that I called and spoke with pharmacy

## 2018-07-09 NOTE — Telephone Encounter (Signed)
Okay to call and approve.  

## 2018-08-12 ENCOUNTER — Ambulatory Visit: Payer: 59 | Admitting: Physician Assistant

## 2018-08-12 ENCOUNTER — Other Ambulatory Visit: Payer: Self-pay | Admitting: *Deleted

## 2018-08-12 DIAGNOSIS — G500 Trigeminal neuralgia: Secondary | ICD-10-CM

## 2018-08-12 MED ORDER — HYDROCODONE-ACETAMINOPHEN 10-325 MG PO TABS: ORAL_TABLET | ORAL | 0 refills | Status: DC

## 2018-08-19 ENCOUNTER — Ambulatory Visit (INDEPENDENT_AMBULATORY_CARE_PROVIDER_SITE_OTHER): Payer: 59 | Admitting: Physician Assistant

## 2018-08-19 ENCOUNTER — Encounter: Payer: Self-pay | Admitting: Physician Assistant

## 2018-08-19 DIAGNOSIS — G500 Trigeminal neuralgia: Secondary | ICD-10-CM

## 2018-08-19 MED ORDER — TRAZODONE HCL 50 MG PO TABS: 50.0000 mg | ORAL_TABLET | Freq: Every evening | ORAL | 5 refills | Status: DC | PRN

## 2018-08-19 MED ORDER — HYDROCODONE-ACETAMINOPHEN 10-325 MG PO TABS: 1.0000 | ORAL_TABLET | Freq: Four times a day (QID) | ORAL | 0 refills | Status: DC | PRN

## 2018-08-19 MED ORDER — HYDROCODONE-ACETAMINOPHEN 10-325 MG PO TABS: ORAL_TABLET | ORAL | 0 refills | Status: DC

## 2018-08-20 NOTE — Progress Notes (Signed)
BP 130/65   Pulse 81   Temp 99.2 F (37.3 C) (Oral)   Ht 5\' 5"  (1.651 m)   Wt 157 lb (71.2 kg)   BMI 26.13 kg/m    Subjective:    Patient ID: Grace [name], female    DOB: 09/23/1961, 57 y.o.   MRN: [id number]  HPI: Grace CaterJeannie W [name] is a 57 y.o. female presenting on 08/19/2018 for Hypertension and Hyperlipidemia  Trigeminal Neuralgia  Chronic pain is under control, patient has no new complaints. Medications are keeping things stable. Needs refills for the next three months.  [place] Controlled Substance website checked and normal. Drug screen normal this year.  She reports that her cholesterol and blood pressure have been well controlled.  Was due to all of her medications.  She does not need refills at this time.  Past Medical History:  Diagnosis Date  . Hidradenitis   . Hyperlipidemia   . Hypertension   . Tobacco user   . Trigeminal neuralgia   . Vitamin D deficiency    Relevant past medical, surgical, family and social history reviewed and updated as indicated. Interim medical history since our last visit reviewed. Allergies and medications reviewed and updated. DATA REVIEWED: CHART IN EPIC  Family History reviewed for pertinent findings.  Review of Systems  Constitutional: Negative.  Negative for activity change, fatigue and fever.  HENT: Negative.   Eyes: Negative.   Respiratory: Negative.  Negative for cough.   Cardiovascular: Negative.  Negative for chest pain.  Gastrointestinal: Negative.  Negative for abdominal pain.  Endocrine: Negative.   Genitourinary: Negative.  Negative for dysuria.  Musculoskeletal: Negative.   Skin: Negative.   Neurological: Negative.     Allergies as of 08/19/2018      Reactions   Prednisone    REACTION: face reddened, skin hot to the touch, increased BP   Statins    Muscle aches   Tricor [fenofibrate]    Muscle aches      Medication List        Accurate as of 08/19/18 11:59 PM. Always use your most recent med list.          amLODipine 5 MG tablet Commonly known as:  NORVASC Take 1 tablet (5 mg total) by mouth daily.   aspirin EC 81 MG tablet Take 81 mg by mouth daily.   clindamycin 1 % lotion Commonly known as:  CLEOCIN T Apply topically 2 (two) times daily.   HYDROcodone-acetaminophen 10-325 MG tablet Commonly known as:  NORCO Take 1 tablet by mouth 4 (four) times daily as needed.   HYDROcodone-acetaminophen 10-325 MG tablet Commonly known as:  NORCO Take 1 tablet by mouth 4 (four) times daily as needed. Fill 60 days from original script date   HYDROcodone-acetaminophen 10-325 MG tablet Commonly known as:  NORCO TAKE ONE TABLET BY MOUTH QID DAILY AS NEEDED FOR  TRIGEMINAL  NEURALGIA   losartan 25 MG tablet Commonly known as:  COZAAR Take 1 tablet (25 mg total) by mouth daily.   traZODone 50 MG tablet Commonly known as:  DESYREL Take 1-2 tablets (50-100 mg total) by mouth at bedtime as needed for sleep.   Vitamin D 2000 units Caps Take 2,000 Units by mouth.          Objective:    BP 130/65   Pulse 81   Temp 99.2 F (37.3 C) (Oral)   Ht 5\' 5"  (1.651 m)   Wt 157 lb (71.2 kg)   BMI 26.13 kg/m  Allergies  Allergen Reactions  . Prednisone     REACTION: face reddened, skin hot to the touch, increased BP  . Statins     Muscle aches  . Tricor [Fenofibrate]     Muscle aches    Wt Readings from Last 3 Encounters:  08/19/18 157 lb (71.2 kg)  05/12/18 162 lb (73.5 kg)  02/10/18 160 lb 6.4 oz (72.8 kg)    Physical Exam  Constitutional: She is oriented to person, place, and time. She appears well-developed and well-nourished.  HENT:  Head: Normocephalic and atraumatic.  Eyes: Pupils are equal, round, and reactive to light. Conjunctivae and EOM are normal.  Cardiovascular: Normal rate, regular rhythm, normal heart sounds and intact distal pulses.  Pulmonary/Chest: Effort normal and breath sounds normal.  Abdominal: Soft. Bowel sounds are normal.  Neurological: She is alert  and oriented to person, place, and time. She has normal reflexes.  Skin: Skin is warm and dry. No rash noted.  Psychiatric: She has a normal mood and affect. Her behavior is normal. Judgment and thought content normal.    Results for orders placed or performed in visit on 03/02/16  HM MAMMOGRAPHY  Result Value Ref Range   HM Mammogram Negative       Assessment & Plan:   1. Trigeminal neuralgia - HYDROcodone-acetaminophen (NORCO) 10-325 MG tablet; Take 1 tablet by mouth 4 (four) times daily as needed.  Dispense: 120 tablet; Refill: 0 - HYDROcodone-acetaminophen (NORCO) 10-325 MG tablet; Take 1 tablet by mouth 4 (four) times daily as needed. Fill 60 days from original script date  Dispense: 120 tablet; Refill: 0 - HYDROcodone-acetaminophen (NORCO) 10-325 MG tablet; TAKE ONE TABLET BY MOUTH QID DAILY AS NEEDED FOR  TRIGEMINAL  NEURALGIA  Dispense: 120 tablet; Refill: 0 - traZODone (DESYREL) 50 MG tablet; Take 1-2 tablets (50-100 mg total) by mouth at bedtime as needed for sleep.  Dispense: 60 tablet; Refill: 5   Continue all other maintenance medications as listed above.  Follow up plan: Return in about 3 months (around 11/19/2018).  Educational handout given for survey  Remus LofflerAngel S. Chieko Neises PA-C [facility] [address]  [place], [place] [address] [phone]   08/20/2018, 10:50 PM

## 2018-09-09 ENCOUNTER — Other Ambulatory Visit: Payer: Self-pay | Admitting: Physician Assistant

## 2018-11-12 ENCOUNTER — Encounter: Payer: Self-pay | Admitting: Physician Assistant

## 2018-11-12 ENCOUNTER — Ambulatory Visit (INDEPENDENT_AMBULATORY_CARE_PROVIDER_SITE_OTHER): Payer: Self-pay | Admitting: Physician Assistant

## 2018-11-12 VITALS — BP 118/66 | HR 73 | Ht 65.0 in | Wt 159.8 lb

## 2018-11-12 DIAGNOSIS — G500 Trigeminal neuralgia: Secondary | ICD-10-CM

## 2018-11-12 DIAGNOSIS — I10 Essential (primary) hypertension: Secondary | ICD-10-CM

## 2018-11-12 MED ORDER — PREDNISONE 10 MG PO TABS: 10.0000 mg | ORAL_TABLET | Freq: Every day | ORAL | 1 refills | Status: DC

## 2018-11-12 MED ORDER — HYDROCODONE-ACETAMINOPHEN 10-325 MG PO TABS: ORAL_TABLET | ORAL | 0 refills | Status: DC

## 2018-11-12 MED ORDER — HYDROCODONE-ACETAMINOPHEN 10-325 MG PO TABS: 1.0000 | ORAL_TABLET | Freq: Four times a day (QID) | ORAL | 0 refills | Status: DC | PRN

## 2018-11-14 NOTE — Progress Notes (Signed)
BP 118/66   Pulse 73   Ht 5\' 5"  (1.651 m)   Wt 159 lb 12.8 oz (72.5 kg)   BMI 26.59 kg/m    Subjective:    Patient ID: Grace [name], female    DOB: [birth year], 57 y.o.   MRN: [id number]  HPI: Grace [name] is a 57 y.o. female presenting on 11/12/2018 for Hyperlipidemia (3 month follow up ) and Hypertension  This patient comes in for 27-month follow-up on her chronic medical conditions which do include trigeminal neuralgia, essential hypertension, hyperlipidemia.  She does need some refills on her regular medications.  We have updated her pain contract.  There are no red flags in the registry.  She states overall she is feeling well.  She does has a significant amount of stress related to finances.  Past Medical History:  Diagnosis Date  . Hidradenitis   . Hyperlipidemia   . Hypertension   . Tobacco user   . Trigeminal neuralgia   . Vitamin D deficiency    Relevant past medical, surgical, family and social history reviewed and updated as indicated. Interim medical history since our last visit reviewed. Allergies and medications reviewed and updated. DATA REVIEWED: CHART IN EPIC  Family History reviewed for pertinent findings.  Review of Systems  Constitutional: Negative.  Negative for activity change, fatigue and fever.  HENT: Negative.   Eyes: Negative.   Respiratory: Negative.  Negative for cough.   Cardiovascular: Negative.  Negative for chest pain.  Gastrointestinal: Negative.  Negative for abdominal pain.  Endocrine: Negative.   Genitourinary: Negative.  Negative for dysuria.  Musculoskeletal: Negative.   Skin: Negative.   Neurological: Negative.     Allergies as of 11/12/2018      Reactions   Prednisone    REACTION: face reddened, skin hot to the touch, increased BP   Statins    Muscle aches   Tricor [fenofibrate]    Muscle aches      Medication List        Accurate as of 11/12/18 11:59 PM. Always use your most recent med list.            amLODipine 5 MG tablet Commonly known as:  NORVASC Take 1 tablet (5 mg total) by mouth daily.   aspirin EC 81 MG tablet Take 81 mg by mouth daily.   clindamycin 1 % lotion Commonly known as:  CLEOCIN T APPLY TOPICALLY TWO TIMES A DAY   HYDROcodone-acetaminophen 10-325 MG tablet Commonly known as:  NORCO Take 1 tablet by mouth 4 (four) times daily as needed.   HYDROcodone-acetaminophen 10-325 MG tablet Commonly known as:  NORCO Take 1 tablet by mouth 4 (four) times daily as needed. Fill 60 days from original script date   HYDROcodone-acetaminophen 10-325 MG tablet Commonly known as:  NORCO TAKE ONE TABLET BY MOUTH QID DAILY AS NEEDED FOR  TRIGEMINAL  NEURALGIA   losartan 25 MG tablet Commonly known as:  COZAAR Take 1 tablet (25 mg total) by mouth daily.   predniSONE 10 MG tablet Commonly known as:  DELTASONE Take 1 tablet (10 mg total) by mouth daily with breakfast.   traZODone 50 MG tablet Commonly known as:  DESYREL Take 1-2 tablets (50-100 mg total) by mouth at bedtime as needed for sleep.   Vitamin D 50 MCG (2000 UT) Caps Take 2,000 Units by mouth.          Objective:    BP 118/66   Pulse 73   Ht 5'  5" (1.651 m)   Wt 159 lb 12.8 oz (72.5 kg)   BMI 26.59 kg/m   Allergies  Allergen Reactions  . Prednisone     REACTION: face reddened, skin hot to the touch, increased BP  . Statins     Muscle aches  . Tricor [Fenofibrate]     Muscle aches    Wt Readings from Last 3 Encounters:  11/12/18 159 lb 12.8 oz (72.5 kg)  08/19/18 157 lb (71.2 kg)  05/12/18 162 lb (73.5 kg)    Physical Exam  Constitutional: She is oriented to person, place, and time. She appears well-developed and well-nourished.  HENT:  Head: Normocephalic and atraumatic.  Eyes: Pupils are equal, round, and reactive to light. Conjunctivae and EOM are normal.  Cardiovascular: Normal rate, regular rhythm, normal heart sounds and intact distal pulses.  Pulmonary/Chest: Effort normal and  breath sounds normal.  Abdominal: Soft. Bowel sounds are normal.  Neurological: She is alert and oriented to person, place, and time. She has normal reflexes.  Skin: Skin is warm and dry. No rash noted.  Psychiatric: She has a normal mood and affect. Her behavior is normal. Judgment and thought content normal.    Results for orders placed or performed in visit on 03/02/16  HM MAMMOGRAPHY  Result Value Ref Range   HM Mammogram Negative       Assessment & Plan:   1. Trigeminal neuralgia - HYDROcodone-acetaminophen (NORCO) 10-325 MG tablet; Take 1 tablet by mouth 4 (four) times daily as needed.  Dispense: 120 tablet; Refill: 0 - HYDROcodone-acetaminophen (NORCO) 10-325 MG tablet; Take 1 tablet by mouth 4 (four) times daily as needed. Fill 60 days from original script date  Dispense: 120 tablet; Refill: 0 - HYDROcodone-acetaminophen (NORCO) 10-325 MG tablet; TAKE ONE TABLET BY MOUTH QID DAILY AS NEEDED FOR  TRIGEMINAL  NEURALGIA  Dispense: 120 tablet; Refill: 0  2. Essential hypertension Continue medications   Continue all other maintenance medications as listed above.  Follow up plan: Return in about 3 months (around 02/12/2019) for recheck.  Educational handout given for survey  Remus LofflerAngel S. Elanora Quin PA-C [facility] [address]  [place], [place] [address] [phone]   11/14/2018, 12:45 PM

## 2019-02-13 ENCOUNTER — Ambulatory Visit: Payer: BLUE CROSS/BLUE SHIELD | Admitting: Physician Assistant

## 2019-02-13 ENCOUNTER — Encounter: Payer: Self-pay | Admitting: Physician Assistant

## 2019-02-13 DIAGNOSIS — G500 Trigeminal neuralgia: Secondary | ICD-10-CM | POA: Diagnosis not present

## 2019-02-13 MED ORDER — HYDROCODONE-ACETAMINOPHEN 10-325 MG PO TABS: ORAL_TABLET | ORAL | 0 refills | Status: DC

## 2019-02-13 MED ORDER — TRAZODONE HCL 50 MG PO TABS: 50.0000 mg | ORAL_TABLET | Freq: Every evening | ORAL | 5 refills | Status: DC | PRN

## 2019-02-13 MED ORDER — HYDROCODONE-ACETAMINOPHEN 10-325 MG PO TABS: 1.0000 | ORAL_TABLET | Freq: Four times a day (QID) | ORAL | 0 refills | Status: DC | PRN

## 2019-02-15 NOTE — Progress Notes (Signed)
BP 105/65   Pulse 89   Ht 5\' 5"  (1.651 m)   Wt 161 lb 9.6 oz (73.3 kg)   BMI 26.89 kg/m    Subjective:    Patient ID: Grace [name], female    DOB: [birth year], 58 y.o.   MRN: [id number]  HPI: Grace [name] is a 58 y.o. female presenting on 02/13/2019 for Hypertension; Hyperlipidemia; and Pain  This patient comes in for periodic recheck on medications and conditions including chronic pain from trigeminal neuralgia, hypertension, elevated cholesterol.   All medications are reviewed today. There are no reports of any problems with the medications. All of the medical conditions are reviewed and updated.  Lab work is reviewed and will be ordered as medically necessary. There are no new problems reported with today's visit.   Past Medical History:  Diagnosis Date  . Hidradenitis   . Hyperlipidemia   . Hypertension   . Tobacco user   . Trigeminal neuralgia   . Vitamin D deficiency    Relevant past medical, surgical, family and social history reviewed and updated as indicated. Interim medical history since our last visit reviewed. Allergies and medications reviewed and updated. DATA REVIEWED: CHART IN EPIC  Family History reviewed for pertinent findings.  Review of Systems  Constitutional: Negative.   HENT: Negative.   Eyes: Negative.   Respiratory: Negative.   Gastrointestinal: Negative.   Genitourinary: Negative.     Allergies as of 02/13/2019      Reactions   Prednisone    REACTION: face reddened, skin hot to the touch, increased BP   Statins    Muscle aches   Tricor [fenofibrate]    Muscle aches      Medication List       Accurate as of February 13, 2019 11:59 PM. Always use your most recent med list.        amLODipine 5 MG tablet Commonly known as:  NORVASC Take 1 tablet (5 mg total) by mouth daily.   aspirin EC 81 MG tablet Take 81 mg by mouth daily.   clindamycin 1 % lotion Commonly known as:  CLEOCIN T APPLY TOPICALLY TWO TIMES A DAY     HYDROcodone-acetaminophen 10-325 MG tablet Commonly known as:  NORCO Take 1 tablet by mouth 4 (four) times daily as needed.   HYDROcodone-acetaminophen 10-325 MG tablet Commonly known as:  NORCO Take 1 tablet by mouth 4 (four) times daily as needed. Fill 60 days from original script date   HYDROcodone-acetaminophen 10-325 MG tablet Commonly known as:  NORCO TAKE ONE TABLET BY MOUTH QID DAILY AS NEEDED FOR  TRIGEMINAL  NEURALGIA. FILL 03/06/2019   losartan 25 MG tablet Commonly known as:  COZAAR Take 1 tablet (25 mg total) by mouth daily.   traZODone 50 MG tablet Commonly known as:  DESYREL Take 1-2 tablets (50-100 mg total) by mouth at bedtime as needed for sleep.   Vitamin D 50 MCG (2000 UT) Caps Take 2,000 Units by mouth.          Objective:    BP 105/65   Pulse 89   Ht 5\' 5"  (1.651 m)   Wt 161 lb 9.6 oz (73.3 kg)   BMI 26.89 kg/m   Allergies  Allergen Reactions  . Prednisone     REACTION: face reddened, skin hot to the touch, increased BP  . Statins     Muscle aches  . Tricor [Fenofibrate]     Muscle aches    Wt Readings from  Last 3 Encounters:  02/13/19 161 lb 9.6 oz (73.3 kg)  11/12/18 159 lb 12.8 oz (72.5 kg)  08/19/18 157 lb (71.2 kg)    Physical Exam Constitutional:      Appearance: She is well-developed.  HENT:     Head: Normocephalic and atraumatic.  Eyes:     Conjunctiva/sclera: Conjunctivae normal.     Pupils: Pupils are equal, round, and reactive to light.  Cardiovascular:     Rate and Rhythm: Normal rate and regular rhythm.     Heart sounds: Normal heart sounds.  Pulmonary:     Effort: Pulmonary effort is normal.     Breath sounds: Normal breath sounds.  Abdominal:     General: Bowel sounds are normal.     Palpations: Abdomen is soft.  Skin:    General: Skin is warm and dry.     Findings: No rash.  Neurological:     Mental Status: She is alert and oriented to person, place, and time.     Deep Tendon Reflexes: Reflexes are normal  and symmetric.  Psychiatric:        Behavior: Behavior normal.        Thought Content: Thought content normal.        Judgment: Judgment normal.     Results for orders placed or performed in visit on 03/02/16  HM MAMMOGRAPHY  Result Value Ref Range   HM Mammogram Negative       Assessment & Plan:   1. Trigeminal neuralgia - HYDROcodone-acetaminophen (NORCO) 10-325 MG tablet; Take 1 tablet by mouth 4 (four) times daily as needed.  Dispense: 120 tablet; Refill: 0 - HYDROcodone-acetaminophen (NORCO) 10-325 MG tablet; Take 1 tablet by mouth 4 (four) times daily as needed. Fill 60 days from original script date  Dispense: 120 tablet; Refill: 0 - traZODone (DESYREL) 50 MG tablet; Take 1-2 tablets (50-100 mg total) by mouth at bedtime as needed for sleep.  Dispense: 60 tablet; Refill: 5 - HYDROcodone-acetaminophen (NORCO) 10-325 MG tablet; TAKE ONE TABLET BY MOUTH QID DAILY AS NEEDED FOR  TRIGEMINAL  NEURALGIA. FILL 03/06/2019  Dispense: 120 tablet; Refill: 0   Continue all other maintenance medications as listed above.  Follow up plan: Recheck 3 months  Educational handout given for survey  Remus Loffler PA-C [facility] [address]  [place], [place] [address] [phone]   02/15/2019, 10:26 PM

## 2019-06-01 ENCOUNTER — Telehealth: Payer: Self-pay | Admitting: Physician Assistant

## 2019-06-01 ENCOUNTER — Ambulatory Visit: Payer: BLUE CROSS/BLUE SHIELD | Admitting: Physician Assistant

## 2019-06-01 ENCOUNTER — Encounter: Payer: Self-pay | Admitting: Physician Assistant

## 2019-06-01 ENCOUNTER — Other Ambulatory Visit: Payer: Self-pay

## 2019-06-01 DIAGNOSIS — I10 Essential (primary) hypertension: Secondary | ICD-10-CM | POA: Diagnosis not present

## 2019-06-01 DIAGNOSIS — G500 Trigeminal neuralgia: Secondary | ICD-10-CM

## 2019-06-01 MED ORDER — HYDROCODONE-ACETAMINOPHEN 10-325 MG PO TABS: ORAL_TABLET | ORAL | 0 refills | Status: DC

## 2019-06-01 MED ORDER — TRAZODONE HCL 50 MG PO TABS: 50.0000 mg | ORAL_TABLET | Freq: Every evening | ORAL | 11 refills | Status: DC | PRN

## 2019-06-01 MED ORDER — AMLODIPINE BESYLATE 5 MG PO TABS: 5.0000 mg | ORAL_TABLET | Freq: Every day | ORAL | 3 refills | Status: DC

## 2019-06-01 MED ORDER — HYDROCODONE-ACETAMINOPHEN 10-325 MG PO TABS: 1.0000 | ORAL_TABLET | Freq: Four times a day (QID) | ORAL | 0 refills | Status: DC | PRN

## 2019-06-01 MED ORDER — LOSARTAN POTASSIUM 25 MG PO TABS: 25.0000 mg | ORAL_TABLET | Freq: Every day | ORAL | 3 refills | Status: DC

## 2019-06-01 NOTE — Progress Notes (Signed)
Telephone visit  Subjective: CC:3 month recheck PCP: Remus Loffler, PA-C BXI:[id number] VERNITA MEINECKE is a 58 y.o. female calls for telephone consult today. Patient provides verbal consent for consult held via phone.  Patient is identified with 2 separate identifiers.  At this time the entire area is on COVID-19 social distancing and stay home orders are in place.  Patient is of higher risk and therefore we are performing this by a virtual method.  Location of patient: home Location of provider: HOME Others present for call: no  She is for a recheck on her hypertension, insomnia and chronic pain from trigeminal neuralgia.  She reports everything has been stable.  She has not checked her blood pressure recently but will do so.  She is not had any changes in her inner pain and the medication seems to be handling things very well still.  She was having a little more insomnia just with COVID-19 going on.  She was in [place] whenever all of the flights will close down and she had to wait longer to get out of [place] and back to the [place].  She reports that she felt very safe in [place] and there were lots of precautions going on however when she got back to [place] that they were all put together in close proximity.   ROS: Per HPI  Allergies  Allergen Reactions   Prednisone     REACTION: face reddened, skin hot to the touch, increased BP   Statins     Muscle aches   Tricor [Fenofibrate]     Muscle aches   Past Medical History:  Diagnosis Date   Hidradenitis    Hyperlipidemia    Hypertension    Tobacco user    Trigeminal neuralgia    Vitamin D deficiency     Current Outpatient Medications:    amLODipine (NORVASC) 5 MG tablet, Take 1 tablet (5 mg total) by mouth daily., Disp: 90 tablet, Rfl: 3   aspirin EC 81 MG tablet, Take 81 mg by mouth daily., Disp: , Rfl:    Cholecalciferol (VITAMIN D) 2000 UNITS CAPS, Take 2,000 Units by mouth., Disp: , Rfl:    clindamycin (CLEOCIN  T) 1 % lotion, APPLY TOPICALLY TWO TIMES A DAY, Disp: 60 mL, Rfl: 4   HYDROcodone-acetaminophen (NORCO) 10-325 MG tablet, Take 1 tablet by mouth 4 (four) times daily as needed., Disp: 120 tablet, Rfl: 0   HYDROcodone-acetaminophen (NORCO) 10-325 MG tablet, Take 1 tablet by mouth 4 (four) times daily as needed. Fill 60 days from original script date, Disp: 120 tablet, Rfl: 0   HYDROcodone-acetaminophen (NORCO) 10-325 MG tablet, TAKE ONE TABLET BY MOUTH QID DAILY AS NEEDED FOR  TRIGEMINAL  NEURALGIA. FILL 03/06/2019, Disp: 120 tablet, Rfl: 0   losartan (COZAAR) 25 MG tablet, Take 1 tablet (25 mg total) by mouth daily., Disp: 90 tablet, Rfl: 3   traZODone (DESYREL) 50 MG tablet, Take 1-2 tablets (50-100 mg total) by mouth at bedtime as needed for sleep., Disp: 60 tablet, Rfl: 5  Assessment/ Plan: 58 y.o. female   1. Essential hypertension - amLODipine (NORVASC) 5 MG tablet; Take 1 tablet (5 mg total) by mouth daily.  Dispense: 90 tablet; Refill: 3 - losartan (COZAAR) 25 MG tablet; Take 1 tablet (25 mg total) by mouth daily.  Dispense: 90 tablet; Refill: 3  2. Trigeminal neuralgia - HYDROcodone-acetaminophen (NORCO) 10-325 MG tablet; Take 1 tablet by mouth 4 (four) times daily as needed.  Dispense: 120 tablet; Refill: 0 -  HYDROcodone-acetaminophen (NORCO) 10-325 MG tablet; Take 1 tablet by mouth 4 (four) times daily as needed. Fill 60 days from original script date  Dispense: 120 tablet; Refill: 0 - HYDROcodone-acetaminophen (NORCO) 10-325 MG tablet; TAKE ONE TABLET BY MOUTH QID DAILY AS NEEDED FOR  TRIGEMINAL  NEURALGIA. FILL 03/06/2019  Dispense: 120 tablet; Refill: 0 - traZODone (DESYREL) 50 MG tablet; Take 1-2 tablets (50-100 mg total) by mouth at bedtime as needed for sleep.  Dispense: 60 tablet; Refill: 11   Start time: 3:06 PM End time: 3:16PM  No orders of the defined types were placed in this encounter.   Prudy FeelerAngel Quiana Cobaugh PA-C [facility] [phone]

## 2019-06-08 ENCOUNTER — Ambulatory Visit: Payer: BLUE CROSS/BLUE SHIELD | Admitting: Physician Assistant

## 2019-07-07 DIAGNOSIS — H40033 Anatomical narrow angle, bilateral: Secondary | ICD-10-CM | POA: Diagnosis not present

## 2019-07-07 DIAGNOSIS — H04123 Dry eye syndrome of bilateral lacrimal glands: Secondary | ICD-10-CM | POA: Diagnosis not present

## 2019-08-28 ENCOUNTER — Other Ambulatory Visit: Payer: Self-pay

## 2019-08-31 ENCOUNTER — Other Ambulatory Visit: Payer: Self-pay

## 2019-08-31 ENCOUNTER — Encounter: Payer: Self-pay | Admitting: Physician Assistant

## 2019-08-31 ENCOUNTER — Ambulatory Visit: Payer: BLUE CROSS/BLUE SHIELD | Admitting: Physician Assistant

## 2019-08-31 VITALS — BP 132/62 | HR 67 | Temp 96.5°F | Ht 65.0 in | Wt 167.8 lb

## 2019-08-31 DIAGNOSIS — L732 Hidradenitis suppurativa: Secondary | ICD-10-CM

## 2019-08-31 DIAGNOSIS — G500 Trigeminal neuralgia: Secondary | ICD-10-CM

## 2019-08-31 MED ORDER — CLINDAMYCIN PHOSPHATE 1 % EX LOTN: TOPICAL_LOTION | CUTANEOUS | 4 refills | Status: DC

## 2019-08-31 MED ORDER — HYDROCODONE-ACETAMINOPHEN 10-325 MG PO TABS: 1.0000 | ORAL_TABLET | Freq: Four times a day (QID) | ORAL | 0 refills | Status: DC | PRN

## 2019-08-31 MED ORDER — HYDROCODONE-ACETAMINOPHEN 10-325 MG PO TABS: ORAL_TABLET | ORAL | 0 refills | Status: DC

## 2019-09-02 ENCOUNTER — Ambulatory Visit: Payer: BLUE CROSS/BLUE SHIELD | Admitting: Physician Assistant

## 2019-09-07 NOTE — Progress Notes (Signed)
BP 132/62   Pulse 67   Temp (!) 96.5 F (35.8 C) (Temporal)   Ht 5\' 5"  (1.651 m)   Wt 167 lb 12.8 oz (76.1 kg)   BMI 27.92 kg/m    Subjective:    Patient ID: Grace [name], female    DOB: 13-Dec-1961, 58 y.o.   MRN: [id number]  HPI: Grace [name] is a 58 y.o. female presenting on 08/31/2019 for Hypertension (3 month ) and Medical Management of Chronic Issues   PAIN ASSESSMENT: Cause of pain-trigeminal neuralgia  This patient returns for a 3 month recheck on narcotic use for the above named conditions  Current medications-3-4 times a day Tried gabapentin and failed Tried ibuprofen and failed  Medication side effects-none Any concerns-no  Pain on scale of 1-10-8 Frequency-Daily What increases pain-efficacy What makes pain Better-medication Effects on ADL -mild Any change in general medical condition-no  Effectiveness of current meds-good Adverse reactions form pain meds-no PMP AWARE website reviewed: Yes Any suspicious activity on PMP Aware: No MME daily dose: 40  Contract on file 11/17/18 Last UDS  2019  [facility] script sent or current  History of overdose or risk of abuse none   Past Medical History:  Diagnosis Date  . Hidradenitis   . Hyperlipidemia   . Hypertension   . Tobacco user   . Trigeminal neuralgia   . Vitamin D deficiency    Relevant past medical, surgical, family and social history reviewed and updated as indicated. Interim medical history since our last visit reviewed. Allergies and medications reviewed and updated. DATA REVIEWED: CHART IN EPIC  Family History reviewed for pertinent findings.  Review of Systems  Constitutional: Negative.   HENT: Negative.   Eyes: Negative.   Respiratory: Negative.   Gastrointestinal: Negative.   Genitourinary: Negative.     Allergies as of 08/31/2019      Reactions   Prednisone    REACTION: face reddened, skin hot to the touch, increased BP   Statins    Muscle aches   Tricor [fenofibrate]     Muscle aches      Medication List       Accurate as of August 31, 2019 11:59 PM. If you have any questions, ask your nurse or doctor.        amLODipine 5 MG tablet Commonly known as: NORVASC Take 1 tablet (5 mg total) by mouth daily.   aspirin EC 81 MG tablet Take 81 mg by mouth daily.   clindamycin 1 % lotion Commonly known as: CLEOCIN T APPLY TOPICALLY TWO TIMES A DAY. Keep on file until patient calls. What changed: See the new instructions. Changed by: Terald Sleeper, PA-C   HYDROcodone-acetaminophen 10-325 MG tablet Commonly known as: NORCO Take 1 tablet by mouth 4 (four) times daily as needed. Fill 10/30/2019 What changed: additional instructions Changed by: Terald Sleeper, PA-C   HYDROcodone-acetaminophen 10-325 MG tablet Commonly known as: NORCO Take 1 tablet by mouth 4 (four) times daily as needed. Fill 09/30/19 What changed: additional instructions Changed by: Terald Sleeper, PA-C   HYDROcodone-acetaminophen 10-325 MG tablet Commonly known as: NORCO TAKE ONE TABLET BY MOUTH QID DAILY AS NEEDED FOR  TRIGEMINAL  NEURALGIA. FILL 03/06/2019 What changed: Another medication with the same name was changed. Make sure you understand how and when to take each. Changed by: Terald Sleeper, PA-C   losartan 25 MG tablet Commonly known as: COZAAR Take 1 tablet (25 mg total) by mouth daily.   traZODone 50  MG tablet Commonly known as: DESYREL Take 1-2 tablets (50-100 mg total) by mouth at bedtime as needed for sleep.   Vitamin D 50 MCG (2000 UT) Caps Take 2,000 Units by mouth.          Objective:    BP 132/62   Pulse 67   Temp (!) 96.5 F (35.8 C) (Temporal)   Ht 5\' 5"  (1.651 m)   Wt 167 lb 12.8 oz (76.1 kg)   BMI 27.92 kg/m   Allergies  Allergen Reactions  . Prednisone     REACTION: face reddened, skin hot to the touch, increased BP  . Statins     Muscle aches  . Tricor [Fenofibrate]     Muscle aches    Wt Readings from Last 3 Encounters:  08/31/19  167 lb 12.8 oz (76.1 kg)  02/13/19 161 lb 9.6 oz (73.3 kg)  11/12/18 159 lb 12.8 oz (72.5 kg)    Physical Exam Constitutional:      General: She is not in acute distress.    Appearance: Normal appearance. She is well-developed.  HENT:     Head: Normocephalic and atraumatic.  Cardiovascular:     Rate and Rhythm: Normal rate.  Pulmonary:     Effort: Pulmonary effort is normal.  Skin:    General: Skin is warm and dry.     Findings: No rash.  Neurological:     Mental Status: She is alert and oriented to person, place, and time.     Deep Tendon Reflexes: Reflexes are normal and symmetric.         Assessment & Plan:   1. Trigeminal neuralgia - HYDROcodone-acetaminophen (NORCO) 10-325 MG tablet; Take 1 tablet by mouth 4 (four) times daily as needed. Fill 10/30/2019  Dispense: 120 tablet; Refill: 0 - HYDROcodone-acetaminophen (NORCO) 10-325 MG tablet; Take 1 tablet by mouth 4 (four) times daily as needed. Fill 09/30/19  Dispense: 120 tablet; Refill: 0 - HYDROcodone-acetaminophen (NORCO) 10-325 MG tablet; TAKE ONE TABLET BY MOUTH QID DAILY AS NEEDED FOR  TRIGEMINAL  NEURALGIA. FILL 03/06/2019  Dispense: 120 tablet; Refill: 0  2. Hidradenitis - clindamycin (CLEOCIN T) 1 % lotion; APPLY TOPICALLY TWO TIMES A DAY. Keep on file until patient calls.  Dispense: 60 mL; Refill: 4   Continue all other maintenance medications as listed above.  Follow up plan: Return in about 3 months (around 11/30/2019) for can have televisit at next visit.  Educational handout given for survey  Remus LofflerAngel S. Aziah Brostrom PA-C [facility] [address]  [place], [place] [address] [phone]   09/07/2019, 7:31 PM

## 2019-10-22 DIAGNOSIS — Z23 Encounter for immunization: Secondary | ICD-10-CM | POA: Diagnosis not present

## 2019-11-09 ENCOUNTER — Other Ambulatory Visit: Payer: Self-pay | Admitting: Physician Assistant

## 2019-11-09 DIAGNOSIS — I10 Essential (primary) hypertension: Secondary | ICD-10-CM

## 2019-11-09 MED ORDER — LOSARTAN POTASSIUM 25 MG PO TABS: 25.0000 mg | ORAL_TABLET | Freq: Every day | ORAL | 0 refills | Status: DC

## 2019-11-09 MED ORDER — AMLODIPINE BESYLATE 5 MG PO TABS: 5.0000 mg | ORAL_TABLET | Freq: Every day | ORAL | 0 refills | Status: DC

## 2019-11-09 NOTE — Telephone Encounter (Signed)
Pt aware refill sent to pharmacy 

## 2019-11-24 ENCOUNTER — Telehealth: Payer: Self-pay

## 2019-11-24 ENCOUNTER — Ambulatory Visit: Payer: BC Managed Care – PPO | Admitting: Physician Assistant

## 2019-11-24 ENCOUNTER — Encounter: Payer: Self-pay | Admitting: Physician Assistant

## 2019-11-24 VITALS — BP 132/75 | HR 75 | Wt 160.0 lb

## 2019-11-24 DIAGNOSIS — J329 Chronic sinusitis, unspecified: Secondary | ICD-10-CM

## 2019-11-24 DIAGNOSIS — G500 Trigeminal neuralgia: Secondary | ICD-10-CM | POA: Diagnosis not present

## 2019-11-24 DIAGNOSIS — I10 Essential (primary) hypertension: Secondary | ICD-10-CM

## 2019-11-24 DIAGNOSIS — Z1211 Encounter for screening for malignant neoplasm of colon: Secondary | ICD-10-CM | POA: Diagnosis not present

## 2019-11-24 MED ORDER — HYDROCODONE-ACETAMINOPHEN 10-325 MG PO TABS: 1.0000 | ORAL_TABLET | Freq: Four times a day (QID) | ORAL | 0 refills | Status: DC | PRN

## 2019-11-24 MED ORDER — HYDROCODONE-ACETAMINOPHEN 10-325 MG PO TABS: ORAL_TABLET | ORAL | 0 refills | Status: DC

## 2019-11-24 MED ORDER — AMOXICILLIN 400 MG/5ML PO SUSR: 400.0000 mg | Freq: Three times a day (TID) | ORAL | 0 refills | Status: DC

## 2019-11-24 MED ORDER — AMOXICILLIN 250 MG/5ML PO SUSR: 500.0000 mg | Freq: Three times a day (TID) | ORAL | 0 refills | Status: DC

## 2019-11-24 NOTE — Telephone Encounter (Signed)
Left message on pharmacy line

## 2019-11-24 NOTE — Telephone Encounter (Signed)
Received a call from [facility] from [place] stating that they do not have enough of the Amoxicillin 250mg /39ml.  States that they do not think the patient would want to drive there twice to get the full amount of rx since she lives in [place]. Suggesting that we send in a new rx for 400mg /65ml.  States that they have 370ml of the 400mg /5ML.  Please advise and if approved send in new rx with dosing.

## 2019-11-24 NOTE — Progress Notes (Signed)
Telephone visit  Subjective: YH:CWCBJSE chronic conditions PCP: Remus Loffler, PA-C GBT:DVVOHYW Grace [name] is a 58 y.o. female calls for telephone consult today. Patient provides verbal consent for consult held via phone.  Patient is identified with 2 separate identifiers.  At this time the entire area is on COVID-19 social distancing and stay home orders are in place.  Patient is of higher risk and therefore we are performing this by a virtual method.  Location of patient: home Location of provider: HOME Others present for call: no Patient is a recheck on her blood pressure and chronic pain.  She reports that overall she is doing very well not having any difficulties.  All of her medications are reviewed and refills will be sent.  PAIN ASSESSMENT: Cause of pain-trigeminal neuralgia  This patient returns for a 3 month recheck on narcotic use for the above named conditions  Current medications-3-4 times a day Tried gabapentin and failed Tried ibuprofen and failed  Medication side effects-none Any concerns-no  Pain on scale of 1-10-8 Frequency-Daily What increases pain-efficacy What makes pain Better-medication Effects on ADL -mild Any change in general medical condition-no  Effectiveness of current meds-good Adverse reactions form pain meds-no PMP AWARE website reviewed: Yes Any suspicious activity on PMP Aware: No MME daily dose: 40  Contract on file 11/17/18 Last UDS  2019  [facility] script sent or current  History of overdose or risk of abuse none  ROS: Per HPI  Allergies  Allergen Reactions  . Prednisone     REACTION: face reddened, skin hot to the touch, increased BP  . Statins     Muscle aches  . Tricor [Fenofibrate]     Muscle aches   Past Medical History:  Diagnosis Date  . Hidradenitis   . Hyperlipidemia   . Hypertension   . Tobacco user   . Trigeminal neuralgia   . Vitamin D deficiency     Current Outpatient Medications:  .   amLODipine (NORVASC) 5 MG tablet, Take 1 tablet (5 mg total) by mouth daily., Disp: 30 tablet, Rfl: 0 .  amoxicillin (AMOXIL) 400 MG/5ML suspension, Take 5 mLs (400 mg total) by mouth 3 (three) times daily., Disp: 150 mL, Rfl: 0 .  aspirin EC 81 MG tablet, Take 81 mg by mouth daily., Disp: , Rfl:  .  Cholecalciferol (VITAMIN D) 2000 UNITS CAPS, Take 2,000 Units by mouth., Disp: , Rfl:  .  clindamycin (CLEOCIN T) 1 % lotion, APPLY TOPICALLY TWO TIMES A DAY. Keep on file until patient calls., Disp: 60 mL, Rfl: 4 .  HYDROcodone-acetaminophen (NORCO) 10-325 MG tablet, Take 1 tablet by mouth 4 (four) times daily as needed. Fill 10/30/2019, Disp: 120 tablet, Rfl: 0 .  HYDROcodone-acetaminophen (NORCO) 10-325 MG tablet, Take 1 tablet by mouth 4 (four) times daily as needed. Fill 09/30/19, Disp: 120 tablet, Rfl: 0 .  HYDROcodone-acetaminophen (NORCO) 10-325 MG tablet, TAKE ONE TABLET BY MOUTH QID DAILY AS NEEDED FOR  TRIGEMINAL  NEURALGIA. FILL 03/06/2019, Disp: 120 tablet, Rfl: 0 .  losartan (COZAAR) 25 MG tablet, Take 1 tablet (25 mg total) by mouth daily., Disp: 30 tablet, Rfl: 0 .  traZODone (DESYREL) 50 MG tablet, Take 1-2 tablets (50-100 mg total) by mouth at bedtime as needed for sleep., Disp: 60 tablet, Rfl: 11  Assessment/ Plan: 58 y.o. female   1. Trigeminal neuralgia - HYDROcodone-acetaminophen (NORCO) 10-325 MG tablet; Take 1 tablet by mouth 4 (four) times daily as needed. Fill 10/30/2019  Dispense: 120 tablet; Refill: 0 - HYDROcodone-acetaminophen (NORCO) 10-325 MG tablet; Take 1 tablet by mouth 4 (four) times daily as needed. Fill 09/30/19  Dispense: 120 tablet; Refill: 0 - HYDROcodone-acetaminophen (NORCO) 10-325 MG tablet; TAKE ONE TABLET BY MOUTH QID DAILY AS NEEDED FOR  TRIGEMINAL  NEURALGIA. FILL 03/06/2019  Dispense: 120 tablet; Refill: 0  2. Essential hypertension  3. Sinusitis, unspecified chronicity, unspecified location - amoxicillin (AMOXIL) 400 MG/5ML suspension; Take 5 mLs (400 mg  total) by mouth 3 (three) times daily.  Dispense: 150 mL; Refill: 0  4. Colon cancer screening - Ambulatory referral to Gastroenterology   Return in about 3 months (around 02/24/2020).  Continue all other maintenance medications as listed above.  Start time: 1:45 PM End time: 1:54 PM  Meds ordered this encounter  Medications  . HYDROcodone-acetaminophen (NORCO) 10-325 MG tablet    Sig: Take 1 tablet by mouth 4 (four) times daily as needed. Fill 10/30/2019    Dispense:  120 tablet    Refill:  0    Fill 60 days from original script date    Order Specific Question:   Supervising Provider    Answer:   Janora Norlander [[id number]]  . HYDROcodone-acetaminophen (NORCO) 10-325 MG tablet    Sig: Take 1 tablet by mouth 4 (four) times daily as needed. Fill 09/30/19    Dispense:  120 tablet    Refill:  0    Fill 30 days from original script date    Order Specific Question:   Supervising Provider    Answer:   Janora Norlander [[id number]]  . HYDROcodone-acetaminophen (NORCO) 10-325 MG tablet    Sig: TAKE ONE TABLET BY MOUTH QID DAILY AS NEEDED FOR  TRIGEMINAL  NEURALGIA. FILL 03/06/2019    Dispense:  120 tablet    Refill:  0    Fill 11/27/2019    Order Specific Question:   Supervising Provider    Answer:   Janora Norlander [[id number]]  . DISCONTD: amoxicillin (AMOXIL) 250 MG/5ML suspension    Sig: Take 10 mLs (500 mg total) by mouth 3 (three) times daily.    Dispense:  300 mL    Refill:  0    Order Specific Question:   Supervising Provider    Answer:   Janora Norlander [[id number]]  . amoxicillin (AMOXIL) 400 MG/5ML suspension    Sig: Take 5 mLs (400 mg total) by mouth 3 (three) times daily.    Dispense:  150 mL    Refill:  0    Order Specific Question:   Supervising Provider    Answer:   Janora Norlander [[id number]]    [name] PA-C [place] [phone]

## 2019-11-24 NOTE — Telephone Encounter (Signed)
done

## 2019-12-02 DIAGNOSIS — Z1231 Encounter for screening mammogram for malignant neoplasm of breast: Secondary | ICD-10-CM | POA: Diagnosis not present

## 2020-02-04 ENCOUNTER — Telehealth: Payer: Self-pay | Admitting: Physician Assistant

## 2020-02-04 DIAGNOSIS — L732 Hidradenitis suppurativa: Secondary | ICD-10-CM

## 2020-02-04 MED ORDER — CLINDAMYCIN PHOSPHATE 1 % EX LOTN: TOPICAL_LOTION | CUTANEOUS | 4 refills | Status: DC

## 2020-02-04 NOTE — Telephone Encounter (Signed)
Sent refill for the patient 

## 2020-02-04 NOTE — Telephone Encounter (Signed)
What is the name of the medication? clindamycin (CLEOCIN T) 1 % lotion  Have you contacted your pharmacy to request a refill? no  Which pharmacy would you like this sent to? [facility] [place]   Patient notified that their request is being sent to the clinical staff for review and that they should receive a call once it is complete. If they do not receive a call within 24 hours they can check with their pharmacy or our office.

## 2020-02-24 ENCOUNTER — Encounter: Payer: Self-pay | Admitting: Physician Assistant

## 2020-02-24 ENCOUNTER — Other Ambulatory Visit: Payer: Self-pay | Admitting: Physician Assistant

## 2020-02-24 ENCOUNTER — Ambulatory Visit (INDEPENDENT_AMBULATORY_CARE_PROVIDER_SITE_OTHER): Payer: BC Managed Care – PPO | Admitting: Physician Assistant

## 2020-02-24 DIAGNOSIS — G500 Trigeminal neuralgia: Secondary | ICD-10-CM

## 2020-02-24 MED ORDER — HYDROCODONE-ACETAMINOPHEN 10-325 MG PO TABS: 1.0000 | ORAL_TABLET | Freq: Four times a day (QID) | ORAL | 0 refills | Status: DC | PRN

## 2020-02-24 MED ORDER — TRAZODONE HCL 50 MG PO TABS: 50.0000 mg | ORAL_TABLET | Freq: Every evening | ORAL | 11 refills | Status: DC | PRN

## 2020-02-24 MED ORDER — CLINDAMYCIN PHOSPHATE 1 % EX LOTN: TOPICAL_LOTION | CUTANEOUS | 4 refills | Status: DC

## 2020-02-24 MED ORDER — HYDROCODONE-ACETAMINOPHEN 10-325 MG PO TABS: ORAL_TABLET | ORAL | 0 refills | Status: DC

## 2020-02-24 NOTE — Progress Notes (Signed)
1256

## 2020-05-23 ENCOUNTER — Encounter: Payer: Self-pay | Admitting: Family

## 2020-05-23 ENCOUNTER — Ambulatory Visit (INDEPENDENT_AMBULATORY_CARE_PROVIDER_SITE_OTHER): Payer: 59 | Admitting: Family

## 2020-05-23 ENCOUNTER — Other Ambulatory Visit: Payer: Self-pay

## 2020-05-23 VITALS — BP 138/70 | HR 91 | Temp 98.3°F | Ht 65.0 in | Wt 166.2 lb

## 2020-05-23 DIAGNOSIS — F1120 Opioid dependence, uncomplicated: Secondary | ICD-10-CM

## 2020-05-23 DIAGNOSIS — Z0000 Encounter for general adult medical examination without abnormal findings: Secondary | ICD-10-CM

## 2020-05-23 DIAGNOSIS — Z1159 Encounter for screening for other viral diseases: Secondary | ICD-10-CM

## 2020-05-23 DIAGNOSIS — E7800 Pure hypercholesterolemia, unspecified: Secondary | ICD-10-CM

## 2020-05-23 DIAGNOSIS — Z79899 Other long term (current) drug therapy: Secondary | ICD-10-CM | POA: Diagnosis not present

## 2020-05-23 DIAGNOSIS — G500 Trigeminal neuralgia: Secondary | ICD-10-CM

## 2020-05-23 DIAGNOSIS — Z114 Encounter for screening for human immunodeficiency virus [HIV]: Secondary | ICD-10-CM

## 2020-05-23 DIAGNOSIS — M545 Low back pain: Secondary | ICD-10-CM

## 2020-05-23 DIAGNOSIS — L732 Hidradenitis suppurativa: Secondary | ICD-10-CM

## 2020-05-23 DIAGNOSIS — I10 Essential (primary) hypertension: Secondary | ICD-10-CM | POA: Diagnosis not present

## 2020-05-23 DIAGNOSIS — G4700 Insomnia, unspecified: Secondary | ICD-10-CM

## 2020-05-23 DIAGNOSIS — G8929 Other chronic pain: Secondary | ICD-10-CM | POA: Insufficient documentation

## 2020-05-23 DIAGNOSIS — Z0001 Encounter for general adult medical examination with abnormal findings: Secondary | ICD-10-CM

## 2020-05-23 DIAGNOSIS — E559 Vitamin D deficiency, unspecified: Secondary | ICD-10-CM

## 2020-05-23 DIAGNOSIS — Z720 Tobacco use: Secondary | ICD-10-CM

## 2020-05-23 MED ORDER — HYDROCODONE-ACETAMINOPHEN 10-325 MG PO TABS: 1.0000 | ORAL_TABLET | Freq: Three times a day (TID) | ORAL | 0 refills | Status: DC | PRN

## 2020-05-23 MED ORDER — SULFAMETHOXAZOLE-TRIMETHOPRIM 200-40 MG/5ML PO SUSP: 20.0000 mL | Freq: Two times a day (BID) | ORAL | 0 refills | Status: AC

## 2020-05-23 NOTE — Progress Notes (Signed)
Subjective:    Patient ID: Grace [name], female    DOB: 1961-10-05, 59 y.o.   MRN: [id number]  Chief Complaint  Patient presents with  . Establish Care  . pain contract   Pt presents to the office today for CPE without pap and to establish care with me.  Hypertension This is a chronic problem. The current episode started more than 1 year ago. The problem has been resolved since onset. The problem is controlled. Associated symptoms include malaise/fatigue. Pertinent negatives include no peripheral edema or shortness of breath. Risk factors for coronary artery disease include dyslipidemia, obesity and sedentary lifestyle. The current treatment provides moderate improvement. There is no history of kidney disease, CAD/MI, CVA or heart failure.  Hyperlipidemia This is a chronic problem. The current episode started more than 1 year ago. The problem is uncontrolled. Recent lipid tests were reviewed and are high. Exacerbating diseases include obesity. Pertinent negatives include no shortness of breath. Current antihyperlipidemic treatment includes statins. The current treatment provides moderate improvement of lipids. Risk factors for coronary artery disease include dyslipidemia, hypertension and a sedentary lifestyle.  Back Pain This is a chronic problem. The current episode started more than 1 year ago. The problem occurs intermittently. The problem has been waxing and waning since onset. The pain is present in the lumbar spine. The quality of the pain is described as aching. The pain is at a severity of 5/10. The pain is moderate. She has tried analgesics for the symptoms. The treatment provided mild relief.  Insomnia Primary symptoms: difficulty falling asleep, frequent awakening, malaise/fatigue.  The current episode started more than one year.  Nicotine Dependence Presents for follow-up visit. Symptoms include insomnia. Her urge triggers include company of smokers. She smokes 1 pack of  cigarettes per day. Compliance with prior treatments has been good.   Trigeminal Neuralgia Pt takes Norco as needed with burning and aching pain of 10 out 10 when laying down.  Hidradenitis Pt complaining of an abscess in her right groin that has gradually worsen over the last month. Denies any discharge.   Review of Systems  Constitutional: Positive for malaise/fatigue.  Respiratory: Negative for shortness of breath.   Musculoskeletal: Positive for back pain.  Psychiatric/Behavioral: The patient has insomnia.   All other systems reviewed and are negative.  Family History  Problem Relation Age of Onset  . Hypertension Mother   . Dementia Mother   . Alcohol abuse Mother   . Cancer Father        lung   Social History   Socioeconomic History  . Marital status: Divorced    Spouse name: Not on file  . Number of children: Not on file  . Years of education: Not on file  . Highest education level: Not on file  Occupational History  . Not on file  Tobacco Use  . Smoking status: Current Every Day Smoker    Packs/day: 1.00    Types: Cigarettes  . Smokeless tobacco: Never Used  Substance and Sexual Activity  . Alcohol use: No  . Drug use: No  . Sexual activity: Not on file  Other Topics Concern  . Not on file  Social History Narrative  . Not on file   Social Determinants of Health   Financial Resource Strain:   . Difficulty of Paying Living Expenses:   Food Insecurity:   . Worried About Charity fundraiser in the Last Year:   . [place] in the Last Year:  Transportation Needs:   . Film/video editor (Medical):   Marland Kitchen Lack of Transportation (Non-Medical):   Physical Activity:   . Days of Exercise per Week:   . Minutes of Exercise per Session:   Stress:   . Feeling of Stress :   Social Connections:   . Frequency of Communication with Friends and Family:   . Frequency of Social Gatherings with Friends and Family:   . Attends Religious Services:   . Active  Member of Clubs or Organizations:   . Attends Archivist Meetings:   Marland Kitchen Marital Status:        Objective:   Physical Exam Vitals reviewed.  Constitutional:      General: She is not in acute distress.    Appearance: She is well-developed.  HENT:     Head: Normocephalic and atraumatic.     Right Ear: Tympanic membrane normal.     Left Ear: Tympanic membrane normal.  Eyes:     Pupils: Pupils are equal, round, and reactive to light.  Neck:     Thyroid: No thyromegaly.  Cardiovascular:     Rate and Rhythm: Normal rate and regular rhythm.     Heart sounds: Normal heart sounds. No murmur.  Pulmonary:     Effort: Pulmonary effort is normal. No respiratory distress.     Breath sounds: Normal breath sounds. No wheezing.  Abdominal:     General: Bowel sounds are normal. There is no distension.     Palpations: Abdomen is soft.     Tenderness: There is no abdominal tenderness.  Musculoskeletal:        General: No tenderness. Normal range of motion.     Cervical back: Normal range of motion and neck supple.  Skin:    General: Skin is warm and dry.     Comments: Abscess in right groin, erythemas and tender  Neurological:     Mental Status: She is alert and oriented to person, place, and time.     Cranial Nerves: No cranial nerve deficit.     Deep Tendon Reflexes: Reflexes are normal and symmetric.  Psychiatric:        Behavior: Behavior normal.        Thought Content: Thought content normal.        Judgment: Judgment normal.       BP 138/70   Pulse 91   Temp 98.3 F (36.8 C) (Temporal)   Ht _0  (1.651 m)   Wt 166 lb 3.2 oz (75.4 kg)   SpO2 99%   BMI 27.66 kg/m      Assessment & Plan:  Grace [name] comes in today with chief complaint of Establish Care and pain contract   Diagnosis and orders addressed:  1. Controlled substance agreement signed Pt reviewed in [place] controlled database, contract and drug screen up dated today Will decrease to #90 from #120  a month - ToxASSURE Select 13 (MW), Urine - HYDROcodone-acetaminophen (NORCO) 10-325 MG tablet; Take 1 tablet by mouth every 8 (eight) hours as needed.  Dispense: 90 tablet; Refill: 0 - HYDROcodone-acetaminophen (NORCO) 10-325 MG tablet; Take 1 tablet by mouth every 8 (eight) hours as needed.  Dispense: 90 tablet; Refill: 0 - HYDROcodone-acetaminophen (NORCO) 10-325 MG tablet; Take 1 tablet by mouth every 8 (eight) hours as needed.  Dispense: 90 tablet; Refill: 0 - CMP14+EGFR - CBC with Differential/Platelet  2. Essential hypertension - CMP14+EGFR - CBC with Differential/Platelet  3. Pure hypercholesterolemia - CMP14+EGFR - CBC with Differential/Platelet -  Lipid panel  4. Tobacco user - CMP14+EGFR - CBC with Differential/Platelet  5. Vitamin D deficiency - CMP14+EGFR - CBC with Differential/Platelet - VITAMIN D 25 Hydroxy (Vit-D Deficiency, Fractures)  6. Trigeminal neuralgia - HYDROcodone-acetaminophen (NORCO) 10-325 MG tablet; Take 1 tablet by mouth every 8 (eight) hours as needed.  Dispense: 90 tablet; Refill: 0 - HYDROcodone-acetaminophen (NORCO) 10-325 MG tablet; Take 1 tablet by mouth every 8 (eight) hours as needed.  Dispense: 90 tablet; Refill: 0 - HYDROcodone-acetaminophen (NORCO) 10-325 MG tablet; Take 1 tablet by mouth every 8 (eight) hours as needed.  Dispense: 90 tablet; Refill: 0 - CMP14+EGFR - CBC with Differential/Platelet  7. Chronic bilateral low back pain, unspecified whether sciatica present - HYDROcodone-acetaminophen (NORCO) 10-325 MG tablet; Take 1 tablet by mouth every 8 (eight) hours as needed.  Dispense: 90 tablet; Refill: 0 - HYDROcodone-acetaminophen (NORCO) 10-325 MG tablet; Take 1 tablet by mouth every 8 (eight) hours as needed.  Dispense: 90 tablet; Refill: 0 - HYDROcodone-acetaminophen (NORCO) 10-325 MG tablet; Take 1 tablet by mouth every 8 (eight) hours as needed.  Dispense: 90 tablet; Refill: 0 - CMP14+EGFR - CBC with  Differential/Platelet  8. Uncomplicated opioid dependence (HCC) - HYDROcodone-acetaminophen (NORCO) 10-325 MG tablet; Take 1 tablet by mouth every 8 (eight) hours as needed.  Dispense: 90 tablet; Refill: 0 - HYDROcodone-acetaminophen (NORCO) 10-325 MG tablet; Take 1 tablet by mouth every 8 (eight) hours as needed.  Dispense: 90 tablet; Refill: 0 - HYDROcodone-acetaminophen (NORCO) 10-325 MG tablet; Take 1 tablet by mouth every 8 (eight) hours as needed.  Dispense: 90 tablet; Refill: 0 - CMP14+EGFR - CBC with Differential/Platelet  9. Annual physical exam - CMP14+EGFR - CBC with Differential/Platelet - Lipid panel - TSH - VITAMIN D 25 Hydroxy (Vit-D Deficiency, Fractures) - Hepatitis C antibody - HIV Antibody (routine testing w rflx)  10. Insomnia, unspecified type  11. Hidradenitis -Warm compresses Keep clean and dry - sulfamethoxazole-trimethoprim (BACTRIM) 200-40 MG/5ML suspension; Take 20 mLs by mouth 2 (two) times daily for 7 days.  Dispense: 280 mL; Refill: 0  12. Need for hepatitis C screening test - Hepatitis C antibody  13. Screening for HIV (human immunodeficiency virus) - HIV Antibody (routine testing w rflx)   Labs pending Health Maintenance reviewed Diet and exercise encouraged  Follow up plan: 3 months    Evelina Dun, FNP

## 2020-05-23 NOTE — Patient Instructions (Signed)
Hidradenitis Suppurativa Hidradenitis suppurativa is a long-term (chronic) skin disease. It is similar to a severe form of acne, but it affects areas of the body where acne would be unusual, especially areas of the body where skin rubs against skin and becomes moist. These include:  Underarms.  Groin.  Genital area.  Buttocks.  Upper thighs.  Breasts. Hidradenitis suppurativa may start out as small lumps or pimples caused by blocked sweat glands or hair follicles. Pimples may develop into deep sores that break open (rupture) and drain pus. Over time, affected areas of skin may thicken and become scarred. This condition is rare and does not spread from person to person (non-contagious). What are the causes? The exact cause of this condition is not known. It may be related to:  Female and female hormones.  An overactive disease-fighting system (immune system). The immune system may over-react to blocked hair follicles or sweat glands and cause swelling and pus-filled sores. What increases the risk? You are more likely to develop this condition if you:  Are female.  Are 11-55 years old.  Have a family history of hidradenitis suppurativa.  Have a personal history of acne.  Are overweight.  Smoke.  Take the medicine lithium. What are the signs or symptoms? The first symptoms are usually painful bumps in the skin, similar to pimples. The condition may get worse over time (progress), or it may only cause mild symptoms. If the disease progresses, symptoms may include:  Skin bumps getting bigger and growing deeper into the skin.  Bumps rupturing and draining pus.  Itchy, infected skin.  Skin getting thicker and scarred.  Tunnels under the skin (fistulas) where pus drains from a bump.  Pain during daily activities, such as pain during walking if your groin area is affected.  Emotional problems, such as stress or depression. This condition may affect your appearance and your  ability or willingness to wear certain clothes or do certain activities. How is this diagnosed? This condition is diagnosed by a health care provider who specializes in skin diseases (dermatologist). You may be diagnosed based on:  Your symptoms and medical history.  A physical exam.  Testing a pus sample for infection.  Blood tests. How is this treated? Your treatment will depend on how severe your symptoms are. The same treatment will not work for everybody with this condition. You may need to try several treatments to find what works best for you. Treatment may include:  Cleaning and bandaging (dressing) your wounds as needed.  Lifestyle changes, such as new skin care routines.  Taking medicines, such as: ? Antibiotics. ? Acne medicines. ? Medicines to reduce the activity of the immune system. ? A diabetes medicine (metformin). ? Birth control pills, for women. ? Steroids to reduce swelling and pain.  Working with a mental health care provider, if you experience emotional distress due to this condition. If you have severe symptoms that do not get better with medicine, you may need surgery. Surgery may involve:  Using a laser to clear the skin and remove hair follicles.  Opening and draining deep sores.  Removing the areas of skin that are diseased and scarred. Follow these instructions at home: Medicines   Take over-the-counter and prescription medicines only as told by your health care provider.  If you were prescribed an antibiotic medicine, take it as told by your health care provider. Do not stop taking the antibiotic even if your condition improves. Skin care  If you have open wounds, cover   them with a clean dressing as told by your health care provider. Keep wounds clean by washing them gently with soap and water when you bathe.  Do not shave the areas where you get hidradenitis suppurativa.  Do not wear deodorant.  Wear loose-fitting clothes.  Try to avoid  getting overheated or sweaty. If you get sweaty or wet, change into clean, dry clothes as soon as you can.  To help relieve pain and itchiness, cover sore areas with a warm, clean washcloth (warm compress) for 5-10 minutes as often as needed.  If told by your health care provider, take a bleach bath twice a week: ? Fill your bathtub halfway with water. ? Pour in  cup of unscented household bleach. ? Soak in the tub for 5-10 minutes. ? Only soak from the neck down. Avoid water on your face and hair. ? Shower to rinse off the bleach from your skin. General instructions  Learn as much as you can about your disease so that you have an active role in your treatment. Work closely with your health care provider to find treatments that work for you.  If you are overweight, work with your health care provider to lose weight as recommended.  Do not use any products that contain nicotine or tobacco, such as cigarettes and e-cigarettes. If you need help quitting, ask your health care provider.  If you struggle with living with this condition, talk with your health care provider or work with a mental health care provider as recommended.  Keep all follow-up visits as told by your health care provider. This is important. Where to find more information  Hidradenitis Suppurativa [employer].: https://www.hs-foundation.org/ Contact a health care provider if you have:  A flare-up of hidradenitis suppurativa.  A fever or chills.  Trouble controlling your symptoms at home.  Trouble doing your daily activities because of your symptoms.  Trouble dealing with emotional problems related to your condition. Summary  Hidradenitis suppurativa is a long-term (chronic) skin disease. It is similar to a severe form of acne, but it affects areas of the body where acne would be unusual.  The first symptoms are usually painful bumps in the skin, similar to pimples. The condition may get worse over time  (progress), or it may only cause mild symptoms.  If you have open wounds, cover them with a clean dressing as told by your health care provider. Keep wounds clean by washing them gently with soap and water when you bathe.  Besides skin care, treatment may include medicines, laser treatment, and surgery. This information is not intended to replace advice given to you by your health care provider. Make sure you discuss any questions you have with your health care provider. Document Revised: 12/25/2017 Document Reviewed: 12/25/2017 Elsevier Patient Education  2020 [employer].  

## 2020-05-24 ENCOUNTER — Other Ambulatory Visit: Payer: Self-pay | Admitting: Family

## 2020-05-24 ENCOUNTER — Other Ambulatory Visit: Payer: Self-pay

## 2020-05-24 LAB — LIPID PANEL
Chol/HDL Ratio: 4.9 ratio — ABNORMAL HIGH (ref 0.0–4.4)
Cholesterol, Total: 206 mg/dL — ABNORMAL HIGH (ref 100–199)
HDL: 42 mg/dL (ref >39)
LDL Chol Calc (NIH): 128 mg/dL — ABNORMAL HIGH (ref 0–99)
Triglycerides: 205 mg/dL — ABNORMAL HIGH (ref 0–149)
VLDL Cholesterol Cal: 36 mg/dL (ref 5–40)

## 2020-05-24 LAB — CMP14+EGFR
ALT: 8 IU/L (ref 0–32)
AST: 13 IU/L (ref 0–40)
Albumin/Globulin Ratio: 1.5 (ref 1.2–2.2)
Albumin: 4 g/dL (ref 3.8–4.9)
Alkaline Phosphatase: 67 IU/L (ref 48–121)
BUN/Creatinine Ratio: 14 (ref 12–28)
BUN: 11 mg/dL (ref 8–27)
Bilirubin Total: 0.2 mg/dL (ref 0.0–1.2)
CO2: 22 mmol/L (ref 20–29)
Calcium: 9.4 mg/dL (ref 8.7–10.3)
Chloride: 101 mmol/L (ref 96–106)
Creatinine, Ser: 0.8 mg/dL (ref 0.57–1.00)
GFR calc Af Amer: 93 mL/min/{1.73_m2} (ref >59)
GFR calc non Af Amer: 80 mL/min/{1.73_m2} (ref >59)
Globulin, Total: 2.7 g/dL (ref 1.5–4.5)
Glucose: 86 mg/dL (ref 65–99)
Potassium: 3.9 mmol/L (ref 3.5–5.2)
Sodium: 137 mmol/L (ref 134–144)
Total Protein: 6.7 g/dL (ref 6.0–8.5)

## 2020-05-24 LAB — CBC WITH DIFFERENTIAL/PLATELET
Basophils Absolute: 0 10*3/uL (ref 0.0–0.2)
Basos: 0 %
EOS (ABSOLUTE): 0.2 10*3/uL (ref 0.0–0.4)
Eos: 2 %
Hematocrit: 40.2 % (ref 34.0–46.6)
Hemoglobin: 13.6 g/dL (ref 11.1–15.9)
Immature Grans (Abs): 0 10*3/uL (ref 0.0–0.1)
Immature Granulocytes: 0 %
Lymphocytes Absolute: 2.9 10*3/uL (ref 0.7–3.1)
Lymphs: 26 %
MCH: 30.7 pg (ref 26.6–33.0)
MCHC: 33.8 g/dL (ref 31.5–35.7)
MCV: 91 fL (ref 79–97)
Monocytes Absolute: 0.7 10*3/uL (ref 0.1–0.9)
Monocytes: 7 %
Neutrophils Absolute: 7.3 10*3/uL — ABNORMAL HIGH (ref 1.4–7.0)
Neutrophils: 65 %
Platelets: 291 10*3/uL (ref 150–450)
RBC: 4.43 x10E6/uL (ref 3.77–5.28)
RDW: 12.2 % (ref 11.7–15.4)
WBC: 11.2 10*3/uL — ABNORMAL HIGH (ref 3.4–10.8)

## 2020-05-24 LAB — HIV ANTIBODY (ROUTINE TESTING W REFLEX): HIV Screen 4th Generation wRfx: NONREACTIVE

## 2020-05-24 LAB — TSH: TSH: 0.832 u[IU]/mL (ref 0.450–4.500)

## 2020-05-24 LAB — HEPATITIS C ANTIBODY: Hep C Virus Ab: <0.1 s/co ratio (ref 0.0–0.9)

## 2020-05-24 LAB — VITAMIN D 25 HYDROXY (VIT D DEFICIENCY, FRACTURES): Vit D, 25-Hydroxy: 24.3 ng/mL — ABNORMAL LOW (ref 30.0–100.0)

## 2020-05-24 MED ORDER — VITAMIN D (ERGOCALCIFEROL) 1.25 MG (50000 UNIT) PO CAPS
50000.0000 [IU] | ORAL_CAPSULE | ORAL | 1 refills | Status: DC
Start: 2020-05-24 — End: 2020-11-15

## 2020-05-25 LAB — TOXASSURE SELECT 13 (MW), URINE

## 2020-05-27 ENCOUNTER — Telehealth: Payer: Self-pay | Admitting: Family

## 2020-05-27 NOTE — Telephone Encounter (Signed)
Caryn Bee called from [facility] stating that pt cant take the Vitamin D Supp that [place] sent in for pt because pt has a hard time swallowing tablets/capsules. Wanted to get a verbal on if it was ok for him to fill Vitamin D3 Rx for pt that he has at pharmacy that pt can open and pour onto her food/drink.   [facility] phone# [phone]

## 2020-05-27 NOTE — Telephone Encounter (Signed)
Pharm called and aware - liquid replacement is ok / they will fax request for what they have in stock

## 2020-06-20 ENCOUNTER — Telehealth: Payer: Self-pay | Admitting: Family

## 2020-06-20 ENCOUNTER — Other Ambulatory Visit: Payer: Self-pay | Admitting: Family

## 2020-06-20 DIAGNOSIS — L732 Hidradenitis suppurativa: Secondary | ICD-10-CM

## 2020-06-20 NOTE — Telephone Encounter (Signed)
I have spoke with Provider about this med. I have LM for pt to return my call- JHB 440pm 06/20/20

## 2020-06-20 NOTE — Telephone Encounter (Addendum)
Pt was called and meds were discussed .  She is aware Lendon Colonel approved early refill.  She is upset that the decrease was not made aware to her before it was sent to the pharm.   She states also that writing "fill in 30 days" on each rx will help patients not to run out on months that have 31 days. When they are written for date to date, there will always be a lapse.   [facility] was called and aware to fill today, pt is going out of town tomorrow.  They will fill tonight, close at 7 pm - pt aware can pick up .  Pt made aware that this refill must last her until next pick up of 07/23/20 - per [facility] and per [employer].  (verbal communication was made with [facility] prior to calling pharm.) -JHB

## 2020-06-20 NOTE — Telephone Encounter (Signed)
  Prescription Request  06/20/2020  What is the name of the medication or equipment? Hydrocodone  Have you contacted your pharmacy to request a refill? (if applicable) Yes  Which pharmacy would you like this sent to? [facility],  Pt says when she last saw Christy on 05/23/20, she didn't realize that Neysa Bonito had changed her dosage for this Rx and is almost out. Pt says she was taking 6 pills per day and now has to take less. Pt leaves for vacation tomorrow and is requesting refill. Pt says she will be back from vacation on 07/01/20.    Patient notified that their request is being sent to the clinical staff for review and that they should receive a response within 2 business days.

## 2020-06-20 NOTE — Telephone Encounter (Signed)
FYI for provider.  Patient was advised that no more pills would be sent to pharmacy and she will have to wait until 24 th to fill.   She says provider did not make her aware of decrease in quantity of pills. I read note in AVS and suggested she should look at the one she received.  Suggested she could be referred to pain clinic but she was against that idea because insurance would not pay and she had tried that before>

## 2020-06-21 ENCOUNTER — Other Ambulatory Visit: Payer: Self-pay | Admitting: *Deleted

## 2020-06-21 DIAGNOSIS — I10 Essential (primary) hypertension: Secondary | ICD-10-CM

## 2020-06-21 MED ORDER — AMLODIPINE BESYLATE 5 MG PO TABS: 5.0000 mg | ORAL_TABLET | Freq: Every day | ORAL | 4 refills | Status: DC

## 2020-06-21 MED ORDER — LOSARTAN POTASSIUM 25 MG PO TABS: 25.0000 mg | ORAL_TABLET | Freq: Every day | ORAL | 4 refills | Status: DC

## 2020-07-19 ENCOUNTER — Ambulatory Visit: Payer: 59 | Admitting: Nurse Practitioner

## 2020-07-25 ENCOUNTER — Telehealth: Payer: Self-pay | Admitting: Family Medicine

## 2020-07-25 NOTE — Telephone Encounter (Signed)
This request has received a favorable outcome and is approved.   Please note any additional information provided by [employer] at the bottom of your screen. You will also receive a faxed copy of the determination. If you have any questions please contact [employer] at [phone]. 

## 2020-07-25 NOTE — Telephone Encounter (Signed)
[employer] has received your information, and the request will be reviewed. You may close this dialog, return to your dashboard, and perform other tasks.  You will receive an electronic determination in [employer]eds. You can see the latest determination by locating this request on your dashboard or by reopening this request. You will also receive a faxed copy of the determination. If you have any questions please contact [employer] at [phone].  If you need assistance, please chat with CoverMyMeds or call us at [phone]. 

## 2020-08-15 ENCOUNTER — Ambulatory Visit: Payer: 59 | Admitting: Family

## 2020-08-16 ENCOUNTER — Encounter: Payer: Self-pay | Admitting: Family

## 2020-08-16 ENCOUNTER — Other Ambulatory Visit: Payer: Self-pay

## 2020-08-16 ENCOUNTER — Other Ambulatory Visit (HOSPITAL_COMMUNITY)
Admission: RE | Admit: 2020-08-16 | Discharge: 2020-08-16 | Disposition: A | Discharge status: Home / Self Care | Payer: 59 | Source: Ambulatory Visit | Attending: Family | Admitting: Family

## 2020-08-16 ENCOUNTER — Ambulatory Visit (INDEPENDENT_AMBULATORY_CARE_PROVIDER_SITE_OTHER): Payer: 59 | Admitting: Family

## 2020-08-16 VITALS — BP 136/72 | HR 97 | Temp 97.9°F | Ht 65.0 in | Wt 170.0 lb

## 2020-08-16 DIAGNOSIS — E7800 Pure hypercholesterolemia, unspecified: Secondary | ICD-10-CM

## 2020-08-16 DIAGNOSIS — F1120 Opioid dependence, uncomplicated: Secondary | ICD-10-CM

## 2020-08-16 DIAGNOSIS — Z01419 Encounter for gynecological examination (general) (routine) without abnormal findings: Secondary | ICD-10-CM | POA: Insufficient documentation

## 2020-08-16 DIAGNOSIS — I10 Essential (primary) hypertension: Secondary | ICD-10-CM

## 2020-08-16 DIAGNOSIS — M5430 Sciatica, unspecified side: Secondary | ICD-10-CM

## 2020-08-16 DIAGNOSIS — E559 Vitamin D deficiency, unspecified: Secondary | ICD-10-CM

## 2020-08-16 DIAGNOSIS — Z01411 Encounter for gynecological examination (general) (routine) with abnormal findings: Secondary | ICD-10-CM | POA: Diagnosis not present

## 2020-08-16 DIAGNOSIS — M545 Low back pain: Secondary | ICD-10-CM | POA: Diagnosis not present

## 2020-08-16 DIAGNOSIS — Z720 Tobacco use: Secondary | ICD-10-CM

## 2020-08-16 DIAGNOSIS — Z79899 Other long term (current) drug therapy: Secondary | ICD-10-CM | POA: Diagnosis not present

## 2020-08-16 DIAGNOSIS — G8929 Other chronic pain: Secondary | ICD-10-CM

## 2020-08-16 DIAGNOSIS — G4700 Insomnia, unspecified: Secondary | ICD-10-CM

## 2020-08-16 DIAGNOSIS — G500 Trigeminal neuralgia: Secondary | ICD-10-CM

## 2020-08-16 MED ORDER — HYDROCODONE-ACETAMINOPHEN 10-325 MG PO TABS: 1.0000 | ORAL_TABLET | Freq: Three times a day (TID) | ORAL | 0 refills | Status: DC | PRN

## 2020-08-16 MED ORDER — LOSARTAN POTASSIUM 25 MG PO TABS: 25.0000 mg | ORAL_TABLET | Freq: Every day | ORAL | 2 refills | Status: DC

## 2020-08-16 MED ORDER — GABAPENTIN 100 MG PO CAPS: 100.0000 mg | ORAL_CAPSULE | Freq: Three times a day (TID) | ORAL | 3 refills | Status: DC

## 2020-08-16 MED ORDER — AMLODIPINE BESYLATE 5 MG PO TABS: 5.0000 mg | ORAL_TABLET | Freq: Every day | ORAL | 2 refills | Status: DC

## 2020-08-16 NOTE — Patient Instructions (Signed)
Sciatica Rehab Ask your health care provider which exercises are safe for you. Do exercises exactly as told by your health care provider and adjust them as directed. It is normal to feel mild stretching, pulling, tightness, or discomfort as you do these exercises. Stop right away if you feel sudden pain or your pain gets worse. Do not begin these exercises until told by your health care provider. Stretching and range-of-motion exercises These exercises warm up your muscles and joints and improve the movement and flexibility of your hips and back. These exercises also help to relieve pain, numbness, and tingling. Sciatic nerve glide 1. Sit in a chair with your head facing down toward your chest. Place your hands behind your back. Let your shoulders slump forward. 2. Slowly straighten one of your legs while you tilt your head back as if you are looking toward the ceiling. Only straighten your leg as far as you can without making your symptoms worse. 3. Hold this position for __________ seconds. 4. Slowly return to the starting position. 5. Repeat with your other leg. Repeat __________ times. Complete this exercise __________ times a day. Knee to chest with hip adduction and internal rotation  1. Lie on your back on a firm surface with both legs straight. 2. Bend one of your knees and move it up toward your chest until you feel a gentle stretch in your lower back and buttock. Then, move your knee toward the shoulder that is on the opposite side from your leg. This is hip adduction and internal rotation. ? Hold your leg in this position by holding on to the front of your knee. 3. Hold this position for __________ seconds. 4. Slowly return to the starting position. 5. Repeat with your other leg. Repeat __________ times. Complete this exercise __________ times a day. Prone extension on elbows  1. Lie on your abdomen on a firm surface. A bed may be too soft for this exercise. 2. Prop yourself up on  your elbows. 3. Use your arms to help lift your chest up until you feel a gentle stretch in your abdomen and your lower back. ? This will place some of your body weight on your elbows. If this is uncomfortable, try stacking pillows under your chest. ? Your hips should stay down, against the surface that you are lying on. Keep your hip and back muscles relaxed. 4. Hold this position for __________ seconds. 5. Slowly relax your upper body and return to the starting position. Repeat __________ times. Complete this exercise __________ times a day. Strengthening exercises These exercises build strength and endurance in your back. Endurance is the ability to use your muscles for a long time, even after they get tired. Pelvic tilt This exercise strengthens the muscles that lie deep in the abdomen. 1. Lie on your back on a firm surface. Bend your knees and keep your feet flat on the floor. 2. Tense your abdominal muscles. Tip your pelvis up toward the ceiling and flatten your lower back into the floor. ? To help with this exercise, you may place a small towel under your lower back and try to push your back into the towel. 3. Hold this position for __________ seconds. 4. Let your muscles relax completely before you repeat this exercise. Repeat __________ times. Complete this exercise __________ times a day. Alternating arm and leg raises  1. Get on your hands and knees on a firm surface. If you are on a hard floor, you may want to use   padding, such as an exercise mat, to cushion your knees. 2. Line up your arms and legs. Your hands should be directly below your shoulders, and your knees should be directly below your hips. 3. Lift your left leg behind you. At the same time, raise your right arm and straighten it in front of you. ? Do not lift your leg higher than your hip. ? Do not lift your arm higher than your shoulder. ? Keep your abdominal and back muscles tight. ? Keep your hips facing the  ground. ? Do not arch your back. ? Keep your balance carefully, and do not hold your breath. 4. Hold this position for __________ seconds. 5. Slowly return to the starting position. 6. Repeat with your right leg and your left arm. Repeat __________ times. Complete this exercise __________ times a day. Posture and body mechanics Good posture and healthy body mechanics can help to relieve stress in your body's tissues and joints. Body mechanics refers to the movements and positions of your body while you do your daily activities. Posture is part of body mechanics. Good posture means:  Your spine is in its natural S-curve position (neutral).  Your shoulders are pulled back slightly.  Your head is not tipped forward. Follow these guidelines to improve your posture and body mechanics in your everyday activities. Standing   When standing, keep your spine neutral and your feet about hip width apart. Keep a slight bend in your knees. Your ears, shoulders, and hips should line up.  When you do a task in which you stand in one place for a long time, place one foot up on a stable object that is 2-4 inches (5-10 cm) high, such as a footstool. This helps keep your spine neutral. Sitting   When sitting, keep your spine neutral and keep your feet flat on the floor. Use a footrest, if necessary, and keep your thighs parallel to the floor. Avoid rounding your shoulders, and avoid tilting your head forward.  When working at a desk or a computer, keep your desk at a height where your hands are slightly lower than your elbows. Slide your chair under your desk so you are close enough to maintain good posture.  When working at a computer, place your monitor at a height where you are looking straight ahead and you do not have to tilt your head forward or downward to look at the screen. Resting  When lying down and resting, avoid positions that are most painful for you.  If you have pain with activities  such as sitting, bending, stooping, or squatting, lie in a position in which your body does not bend very much. For example, avoid curling up on your side with your arms and knees near your chest (fetal position).  If you have pain with activities such as standing for a long time or reaching with your arms, lie with your spine in a neutral position and bend your knees slightly. Try the following positions: ? Lying on your side with a pillow between your knees. ? Lying on your back with a pillow under your knees. Lifting   When lifting objects, keep your feet at least shoulder width apart and tighten your abdominal muscles.  Bend your knees and hips and keep your spine neutral. It is important to lift using the strength of your legs, not your back. Do not lock your knees straight out.  Always ask for help to lift heavy or awkward objects. This information is not   intended to replace advice given to you by your health care provider. Make sure you discuss any questions you have with your health care provider. Document Revised: 04/10/2019 Document Reviewed: 01/08/2019 Elsevier Patient Education  2020 [employer] Inc.  

## 2020-08-16 NOTE — Progress Notes (Signed)
Subjective:    Patient ID: Grace [name], female    DOB: March 06, 1961, 59 y.o.   MRN: [id number]  Chief Complaint  Patient presents with  . Gynecologic Exam  . Leg Pain    right   PT presents to the office today for pap and chronic follow up.  Gynecologic Exam The patient's pertinent negatives include no genital itching or vaginal discharge. This is a chronic problem. The current episode started more than 1 year ago. Associated symptoms include back pain.  Leg Pain  The incident occurred 3 to 5 days ago. There was no injury mechanism. The quality of the pain is described as aching. The pain is moderate. Associated symptoms include numbness and tingling.  Hypertension This is a chronic problem. The current episode started more than 1 year ago. The problem has been resolved since onset. The problem is controlled. Associated symptoms include malaise/fatigue. Pertinent negatives include no peripheral edema or shortness of breath. Risk factors for coronary artery disease include dyslipidemia, obesity and sedentary lifestyle. Past treatments include calcium channel blockers and angiotensin blockers. The current treatment provides moderate improvement. There is no history of kidney disease or heart failure.  Nicotine Dependence Presents for follow-up visit. Symptoms include insomnia. Her urge triggers include company of smokers. The symptoms have been stable. She smokes 1 pack of cigarettes per day. Compliance with prior treatments has been good.  Insomnia Primary symptoms: difficulty falling asleep, frequent awakening, malaise/fatigue.  The current episode started more than one year. The onset quality is gradual. The problem occurs intermittently.  Hyperlipidemia This is a chronic problem. The current episode started more than 1 year ago. The problem is uncontrolled. Exacerbating diseases include obesity. Associated symptoms include leg pain. Pertinent negatives include no shortness of breath. She  is currently on no antihyperlipidemic treatment. The current treatment provides no improvement of lipids. Risk factors for coronary artery disease include dyslipidemia, hypertension, a sedentary lifestyle and post-menopausal.  Back Pain This is a chronic problem. The current episode started more than 1 year ago. The problem occurs intermittently. The problem has been waxing and waning since onset. The pain is present in the lumbar spine. The quality of the pain is described as aching. The pain radiates to the right thigh and right knee. The pain is at a severity of 9/10. The pain is severe. Associated symptoms include leg pain, numbness and tingling. Risk factors include obesity. She has tried bed rest, analgesics, muscle relaxant and NSAIDs for the symptoms. The treatment provided mild relief.      Review of Systems  Constitutional: Positive for malaise/fatigue.  Respiratory: Negative for shortness of breath.   Genitourinary: Negative for vaginal discharge.  Musculoskeletal: Positive for back pain.  Neurological: Positive for tingling and numbness.  Psychiatric/Behavioral: The patient has insomnia.   All other systems reviewed and are negative.      Objective:   Physical Exam Vitals reviewed.  Constitutional:      General: She is not in acute distress.    Appearance: She is well-developed.  HENT:     Head: Normocephalic and atraumatic.     Right Ear: Tympanic membrane normal.     Left Ear: Tympanic membrane normal.  Eyes:     Pupils: Pupils are equal, round, and reactive to light.  Neck:     Thyroid: No thyromegaly.  Cardiovascular:     Rate and Rhythm: Normal rate and regular rhythm.     Heart sounds: Normal heart sounds. No murmur heard.  Pulmonary:     Effort: Pulmonary effort is normal. No respiratory distress.     Breath sounds: Normal breath sounds. No wheezing.  Abdominal:     General: Bowel sounds are normal. There is no distension.     Palpations: Abdomen is soft.       Tenderness: There is no abdominal tenderness.  Genitourinary:    General: Normal vulva.     Comments: Bimanual exam- no adnexal masses or tenderness, ovaries nonpalpable   Cervix parous and pink- slight white discharge  Musculoskeletal:        General: No tenderness. Normal range of motion.     Cervical back: Normal range of motion and neck supple.  Skin:    General: Skin is warm and dry.  Neurological:     Mental Status: She is alert and oriented to person, place, and time.     Cranial Nerves: No cranial nerve deficit.     Deep Tendon Reflexes: Reflexes are normal and symmetric.  Psychiatric:        Behavior: Behavior normal.        Thought Content: Thought content normal.        Judgment: Judgment normal.       BP 136/72   Pulse 97   Temp 97.9 F (36.6 C) (Temporal)   Ht 5' 5"  (1.651 m)   Wt 170 lb (77.1 kg)   SpO2 99%   BMI 28.29 kg/m      Assessment & Plan:  KYNEDI PROFITT comes in today with chief complaint of Gynecologic Exam and Leg Pain (right)   Diagnosis and orders addressed:  1. Essential hypertension - CMP14+EGFR - CBC with Differential/Platelet - losartan (COZAAR) 25 MG tablet; Take 1 tablet (25 mg total) by mouth daily.  Dispense: 90 tablet; Refill: 2 - amLODipine (NORVASC) 5 MG tablet; Take 1 tablet (5 mg total) by mouth daily.  Dispense: 90 tablet; Refill: 2  2. Chronic bilateral low back pain, unspecified whether sciatica present - HYDROcodone-acetaminophen (NORCO) 10-325 MG tablet; Take 1 tablet by mouth every 8 (eight) hours as needed.  Dispense: 90 tablet; Refill: 0 - HYDROcodone-acetaminophen (NORCO) 10-325 MG tablet; Take 1 tablet by mouth every 8 (eight) hours as needed.  Dispense: 90 tablet; Refill: 0 - HYDROcodone-acetaminophen (NORCO) 10-325 MG tablet; Take 1 tablet by mouth every 8 (eight) hours as needed.  Dispense: 90 tablet; Refill: 0 - CMP14+EGFR - CBC with Differential/Platelet  3. Controlled substance agreement signed -  HYDROcodone-acetaminophen (NORCO) 10-325 MG tablet; Take 1 tablet by mouth every 8 (eight) hours as needed.  Dispense: 90 tablet; Refill: 0 - HYDROcodone-acetaminophen (NORCO) 10-325 MG tablet; Take 1 tablet by mouth every 8 (eight) hours as needed.  Dispense: 90 tablet; Refill: 0 - HYDROcodone-acetaminophen (NORCO) 10-325 MG tablet; Take 1 tablet by mouth every 8 (eight) hours as needed.  Dispense: 90 tablet; Refill: 0 - CMP14+EGFR - CBC with Differential/Platelet  4. Pure hypercholesterolemia - CMP14+EGFR - CBC with Differential/Platelet  5. Insomnia, unspecified type - CMP14+EGFR - CBC with Differential/Platelet  6. Tobacco user - CMP14+EGFR - CBC with Differential/Platelet  7. Uncomplicated opioid dependence (HCC) - HYDROcodone-acetaminophen (NORCO) 10-325 MG tablet; Take 1 tablet by mouth every 8 (eight) hours as needed.  Dispense: 90 tablet; Refill: 0 - HYDROcodone-acetaminophen (NORCO) 10-325 MG tablet; Take 1 tablet by mouth every 8 (eight) hours as needed.  Dispense: 90 tablet; Refill: 0 - HYDROcodone-acetaminophen (NORCO) 10-325 MG tablet; Take 1 tablet by mouth every 8 (eight) hours as needed.  Dispense:  90 tablet; Refill: 0 - CMP14+EGFR - CBC with Differential/Platelet  8. Vitamin D deficiency - CMP14+EGFR - CBC with Differential/Platelet  9. Trigeminal neuralgia - HYDROcodone-acetaminophen (NORCO) 10-325 MG tablet; Take 1 tablet by mouth every 8 (eight) hours as needed.  Dispense: 90 tablet; Refill: 0 - HYDROcodone-acetaminophen (NORCO) 10-325 MG tablet; Take 1 tablet by mouth every 8 (eight) hours as needed.  Dispense: 90 tablet; Refill: 0 - HYDROcodone-acetaminophen (NORCO) 10-325 MG tablet; Take 1 tablet by mouth every 8 (eight) hours as needed.  Dispense: 90 tablet; Refill: 0 - CMP14+EGFR - CBC with Differential/Platelet  10. Sciatic leg pain - CMP14+EGFR - CBC with Differential/Platelet - Ambulatory referral to Physical Therapy - gabapentin (NEURONTIN) 100 MG  capsule; Take 1 capsule (100 mg total) by mouth 3 (three) times daily.  Dispense: 90 capsule; Refill: 3  11. Gynecologic exam normal - Cytology - PAP([place])   Long discussion with patient, we will keep Norco #90 a month.  Continue current meds- low fat diet and exercise and recheck in 3 months Health Maintenance reviewed Diet and exercise encouraged  Follow up plan: 3 months    Evelina Dun, FNP

## 2020-08-18 LAB — CYTOLOGY - PAP: Diagnosis: NEGATIVE

## 2020-11-15 ENCOUNTER — Other Ambulatory Visit: Payer: Self-pay

## 2020-11-15 ENCOUNTER — Encounter: Payer: Self-pay | Admitting: Family

## 2020-11-15 ENCOUNTER — Ambulatory Visit: Payer: 59 | Admitting: Family

## 2020-11-15 VITALS — BP 143/78 | HR 81 | Temp 97.7°F | Ht 65.0 in | Wt 175.0 lb

## 2020-11-15 DIAGNOSIS — E7800 Pure hypercholesterolemia, unspecified: Secondary | ICD-10-CM

## 2020-11-15 DIAGNOSIS — I10 Essential (primary) hypertension: Secondary | ICD-10-CM

## 2020-11-15 DIAGNOSIS — Z79899 Other long term (current) drug therapy: Secondary | ICD-10-CM

## 2020-11-15 DIAGNOSIS — M545 Low back pain, unspecified: Secondary | ICD-10-CM

## 2020-11-15 DIAGNOSIS — F1120 Opioid dependence, uncomplicated: Secondary | ICD-10-CM

## 2020-11-15 DIAGNOSIS — R5383 Other fatigue: Secondary | ICD-10-CM

## 2020-11-15 DIAGNOSIS — G4700 Insomnia, unspecified: Secondary | ICD-10-CM

## 2020-11-15 DIAGNOSIS — G8929 Other chronic pain: Secondary | ICD-10-CM

## 2020-11-15 DIAGNOSIS — E559 Vitamin D deficiency, unspecified: Secondary | ICD-10-CM

## 2020-11-15 DIAGNOSIS — Z23 Encounter for immunization: Secondary | ICD-10-CM

## 2020-11-15 DIAGNOSIS — M5430 Sciatica, unspecified side: Secondary | ICD-10-CM

## 2020-11-15 DIAGNOSIS — Z720 Tobacco use: Secondary | ICD-10-CM

## 2020-11-15 DIAGNOSIS — L732 Hidradenitis suppurativa: Secondary | ICD-10-CM

## 2020-11-15 DIAGNOSIS — G500 Trigeminal neuralgia: Secondary | ICD-10-CM

## 2020-11-15 MED ORDER — HYDROCODONE-ACETAMINOPHEN 10-325 MG PO TABS: 1.0000 | ORAL_TABLET | Freq: Three times a day (TID) | ORAL | 0 refills | Status: DC | PRN

## 2020-11-15 MED ORDER — GABAPENTIN 100 MG PO CAPS: 100.0000 mg | ORAL_CAPSULE | Freq: Three times a day (TID) | ORAL | 3 refills | Status: DC

## 2020-11-15 MED ORDER — AMLODIPINE BESYLATE 5 MG PO TABS: 5.0000 mg | ORAL_TABLET | Freq: Every day | ORAL | 2 refills | Status: DC

## 2020-11-15 MED ORDER — LOSARTAN POTASSIUM 25 MG PO TABS: 25.0000 mg | ORAL_TABLET | Freq: Every day | ORAL | 2 refills | Status: DC

## 2020-11-15 MED ORDER — TRAZODONE HCL 50 MG PO TABS: 50.0000 mg | ORAL_TABLET | Freq: Every evening | ORAL | 11 refills | Status: DC | PRN

## 2020-11-15 MED ORDER — CLINDAMYCIN PHOSPHATE 1 % EX LOTN: TOPICAL_LOTION | CUTANEOUS | 4 refills | Status: DC

## 2020-11-15 NOTE — Progress Notes (Signed)
Subjective:    Patient ID: Grace [name], female    DOB: 1961-06-22, 59 y.o.   MRN: [id number]  Chief Complaint  Patient presents with  . Hypertension   Pt presents to the office today for chronic follow up. PT states she has been having increased fatigue over the last month. She reports she has gained about 25 lbs prior to COVID. She is unsure if this is causing the fatigue.   She also has trigeminal neuralgia that comes and goes that can be a 10 out 10.  Hypertension This is a chronic problem. The current episode started more than 1 year ago. The problem has been waxing and waning since onset. The problem is controlled. Associated symptoms include malaise/fatigue. Pertinent negatives include no peripheral edema or shortness of breath. Risk factors for coronary artery disease include dyslipidemia and sedentary lifestyle. The current treatment provides moderate improvement.  Hyperlipidemia This is a chronic problem. The current episode started more than 1 year ago. The problem is uncontrolled. Pertinent negatives include no shortness of breath. Current antihyperlipidemic treatment includes diet change. The current treatment provides no improvement of lipids. Risk factors for coronary artery disease include dyslipidemia, hypertension and a sedentary lifestyle.  Insomnia Primary symptoms: difficulty falling asleep, frequent awakening, malaise/fatigue.  The current episode started more than one year. The onset quality is gradual. The problem occurs intermittently.  Back Pain This is a chronic problem. The current episode started more than 1 year ago. The problem occurs intermittently. The pain is present in the lumbar spine. The quality of the pain is described as aching. The pain is at a severity of 10/10. The pain is moderate. She has tried analgesics for the symptoms. The treatment provided mild relief.  Nicotine Dependence Presents for follow-up visit. Symptoms include insomnia. Her urge  triggers include company of smokers. She smokes 1 pack of cigarettes per day. Compliance with prior treatments has been good.      Review of Systems  Constitutional: Positive for malaise/fatigue.  Respiratory: Negative for shortness of breath.   Musculoskeletal: Positive for back pain.  Psychiatric/Behavioral: The patient has insomnia.   All other systems reviewed and are negative.      Objective:   Physical Exam Vitals reviewed.  Constitutional:      General: She is not in acute distress.    Appearance: She is well-developed.  HENT:     Head: Normocephalic and atraumatic.     Right Ear: Tympanic membrane normal.     Left Ear: Tympanic membrane normal.  Eyes:     Pupils: Pupils are equal, round, and reactive to light.  Neck:     Thyroid: No thyromegaly.  Cardiovascular:     Rate and Rhythm: Normal rate and regular rhythm.     Heart sounds: Normal heart sounds. No murmur heard.   Pulmonary:     Effort: Pulmonary effort is normal. No respiratory distress.     Breath sounds: Normal breath sounds. No wheezing.  Abdominal:     General: Bowel sounds are normal. There is no distension.     Palpations: Abdomen is soft.     Tenderness: There is no abdominal tenderness.  Musculoskeletal:        General: No tenderness. Normal range of motion.     Cervical back: Normal range of motion and neck supple.  Skin:    General: Skin is warm and dry.  Neurological:     Mental Status: She is alert and oriented to person, place, and  time.     Cranial Nerves: No cranial nerve deficit.     Deep Tendon Reflexes: Reflexes are normal and symmetric.  Psychiatric:        Behavior: Behavior normal.        Thought Content: Thought content normal.        Judgment: Judgment normal.          BP (!) 154/72   Pulse 84   Temp 97.7 F (36.5 C) (Temporal)   Ht 5\' 5"  (1.651 m)   Wt 175 lb (79.4 kg)   SpO2 99%   BMI 29.12 kg/m   Assessment & Plan:  .TYLICIA SHERMAN comes in today with  chief complaint of Hypertension   Diagnosis and orders addressed:  1. Chronic bilateral low back pain, unspecified whether sciatica present - HYDROcodone-acetaminophen (NORCO) 10-325 MG tablet; Take 1 tablet by mouth every 8 (eight) hours as needed.  Dispense: 90 tablet; Refill: 0 - HYDROcodone-acetaminophen (NORCO) 10-325 MG tablet; Take 1 tablet by mouth every 8 (eight) hours as needed.  Dispense: 90 tablet; Refill: 0 - HYDROcodone-acetaminophen (NORCO) 10-325 MG tablet; Take 1 tablet by mouth every 8 (eight) hours as needed.  Dispense: 90 tablet; Refill: 0  2. Controlled substance agreement signed - HYDROcodone-acetaminophen (NORCO) 10-325 MG tablet; Take 1 tablet by mouth every 8 (eight) hours as needed.  Dispense: 90 tablet; Refill: 0 - HYDROcodone-acetaminophen (NORCO) 10-325 MG tablet; Take 1 tablet by mouth every 8 (eight) hours as needed.  Dispense: 90 tablet; Refill: 0 - HYDROcodone-acetaminophen (NORCO) 10-325 MG tablet; Take 1 tablet by mouth every 8 (eight) hours as needed.  Dispense: 90 tablet; Refill: 0  3. Uncomplicated opioid dependence (HCC) - HYDROcodone-acetaminophen (NORCO) 10-325 MG tablet; Take 1 tablet by mouth every 8 (eight) hours as needed.  Dispense: 90 tablet; Refill: 0 - HYDROcodone-acetaminophen (NORCO) 10-325 MG tablet; Take 1 tablet by mouth every 8 (eight) hours as needed.  Dispense: 90 tablet; Refill: 0 - HYDROcodone-acetaminophen (NORCO) 10-325 MG tablet; Take 1 tablet by mouth every 8 (eight) hours as needed.  Dispense: 90 tablet; Refill: 0  4. Trigeminal neuralgia - HYDROcodone-acetaminophen (NORCO) 10-325 MG tablet; Take 1 tablet by mouth every 8 (eight) hours as needed.  Dispense: 90 tablet; Refill: 0 - HYDROcodone-acetaminophen (NORCO) 10-325 MG tablet; Take 1 tablet by mouth every 8 (eight) hours as needed.  Dispense: 90 tablet; Refill: 0 - HYDROcodone-acetaminophen (NORCO) 10-325 MG tablet; Take 1 tablet by mouth every 8 (eight) hours as needed.   Dispense: 90 tablet; Refill: 0 - traZODone (DESYREL) 50 MG tablet; Take 1-2 tablets (50-100 mg total) by mouth at bedtime as needed for sleep.  Dispense: 60 tablet; Refill: 11  5. Essential hypertension - losartan (COZAAR) 25 MG tablet; Take 1 tablet (25 mg total) by mouth daily.  Dispense: 90 tablet; Refill: 2 - amLODipine (NORVASC) 5 MG tablet; Take 1 tablet (5 mg total) by mouth daily.  Dispense: 90 tablet; Refill: 2  6. Hidradenitis - clindamycin (CLEOCIN T) 1 % lotion; APPLY TOPICALLY TWO TIMES A DAY. Keep on file until patient calls.  Dispense: 60 mL; Refill: 4  7. Sciatic leg pain - gabapentin (NEURONTIN) 100 MG capsule; Take 1 capsule (100 mg total) by mouth 3 (three) times daily.  Dispense: 90 capsule; Refill: 3  8. Need for immunization against influenza - Flu Vaccine QUAD 36+ mos IM  9. Primary hypertension  10. Pure hypercholesterolemia  11. Insomnia, unspecified type  12. Tobacco user  13. Vitamin D deficiency  14. Fatigue,  unspecified type   Pt refuses labs at this time, as she is unsure her insurance will cover it Patient reviewed in [place] controlled database, no flags noted. Contract and drug screen are up to date.  Health Maintenance reviewed Diet and exercise encouraged  Follow up plan: 3 months    Jannifer Rodney, FNP

## 2020-11-15 NOTE — Patient Instructions (Signed)
Trigeminal Neuralgia  Trigeminal neuralgia is a nerve disorder that causes severe pain on one side of the face. The pain may last from a few seconds to several minutes. The pain is usually only on one side of the face. Symptoms may occur for days, weeks, or months and then go away for months or years. The pain may return and be worse than before. What are the causes? This condition is caused by damage or pressure to a nerve in the head that is called the trigeminal nerve. An attack can be triggered by:  Talking.  Chewing.  Putting on makeup.  Washing your face.  Shaving your face.  Brushing your teeth.  Touching your face. What increases the risk? You are more likely to develop this condition if you:  Are 50 years of age or older.  Are female. What are the signs or symptoms? The main symptom of this condition is severe pain in the:  Jaw.  Lips.  Eyes.  Nose.  Scalp.  Forehead.  Face. The pain may be:  Intense.  Stabbing.  Electric.  Shock-like. How is this diagnosed? This condition is diagnosed with a physical exam. A CT scan or an MRI may be done to rule out other conditions that can cause facial pain. How is this treated? This condition may be treated with:  Avoiding the things that trigger your symptoms.  Taking prescription medicines (anticonvulsants).  Having surgery. This may be done in severe cases if other medical treatment does not provide relief.  Having procedures such as ablation, thermal, or radiation therapy. It may take up to one month for treatment to start relieving the pain. Follow these instructions at home: Managing pain  Learn as much as you can about how to manage your pain. Ask your health care provider if a pain specialist would be helpful.  Consider talking with a mental health care provider (psychologist) about how to cope with the pain.  Consider joining a pain support group. General instructions  Take  over-the-counter and prescription medicines only as told by your health care provider.  Avoid the things that trigger your symptoms. It may help to: ? Chew on the unaffected side of your mouth. ? Avoid touching your face. ? Avoid blasts of hot or cold air.  Follow your treatment plan as told by your health care provider. This may include: ? Cognitive or behavioral therapy. ? Gentle, regular exercise. ? Meditation or yoga. ? Aromatherapy.  Keep all follow-up visits as told by your health care provider. You may need to be monitored closely to make sure treatment is working well for you. Where to find more information  F[employer] Association: fpa-support.org Contact a health care provider if:  Your medicine is not helping your symptoms.  You have side effects from the medicine used for treatment.  You develop new, unexplained symptoms, such as: ? Double vision. ? Facial weakness. ? Facial numbness. ? Changes in hearing or balance.  You feel depressed. Get help right away if:  Your pain is severe and is not getting better.  You develop suicidal thoughts. If you ever feel like you may hurt yourself or others, or have thoughts about taking your own life, get help right away. You can go to your nearest emergency department or call:  Your local emergency services (911 in the [place]  A suicide crisis helpline, such as the National Suicide Prevention Lifeline at 1-800-273-8255. This is open 24 hours a day. Summary  Trigeminal neuralgia is a   nerve disorder that causes severe pain on one side of the face. The pain may last from a few seconds to several minutes.  This condition is caused by damage or pressure to a nerve in the head that is called the trigeminal nerve.  Treatment may include avoiding the things that trigger your symptoms, taking medicines, or having surgery or procedures. It may take up to one month for treatment to start relieving the pain.  Avoid the things that  trigger your symptoms.  Keep all follow-up visits as told by your health care provider. You may need to be monitored closely to make sure treatment is working well for you. This information is not intended to replace advice given to you by your health care provider. Make sure you discuss any questions you have with your health care provider. Document Revised: 11/03/2018 Document Reviewed: 11/03/2018 [employer] Patient Education  2020 [employer]  

## 2021-02-14 ENCOUNTER — Other Ambulatory Visit: Payer: Self-pay

## 2021-02-14 ENCOUNTER — Ambulatory Visit: Payer: 59 | Admitting: Family

## 2021-02-14 ENCOUNTER — Encounter: Payer: Self-pay | Admitting: Family

## 2021-02-14 VITALS — BP 136/68 | HR 83 | Temp 97.2°F | Ht 65.0 in | Wt 172.0 lb

## 2021-02-14 DIAGNOSIS — Z79899 Other long term (current) drug therapy: Secondary | ICD-10-CM

## 2021-02-14 DIAGNOSIS — M545 Low back pain, unspecified: Secondary | ICD-10-CM

## 2021-02-14 DIAGNOSIS — L732 Hidradenitis suppurativa: Secondary | ICD-10-CM

## 2021-02-14 DIAGNOSIS — I10 Essential (primary) hypertension: Secondary | ICD-10-CM

## 2021-02-14 DIAGNOSIS — E7800 Pure hypercholesterolemia, unspecified: Secondary | ICD-10-CM

## 2021-02-14 DIAGNOSIS — Z720 Tobacco use: Secondary | ICD-10-CM

## 2021-02-14 DIAGNOSIS — G500 Trigeminal neuralgia: Secondary | ICD-10-CM

## 2021-02-14 DIAGNOSIS — F1120 Opioid dependence, uncomplicated: Secondary | ICD-10-CM

## 2021-02-14 DIAGNOSIS — G4700 Insomnia, unspecified: Secondary | ICD-10-CM

## 2021-02-14 DIAGNOSIS — G8929 Other chronic pain: Secondary | ICD-10-CM

## 2021-02-14 DIAGNOSIS — E559 Vitamin D deficiency, unspecified: Secondary | ICD-10-CM

## 2021-02-14 MED ORDER — HYDROCODONE-ACETAMINOPHEN 10-325 MG PO TABS
1.0000 | ORAL_TABLET | Freq: Three times a day (TID) | ORAL | 0 refills | Status: DC | PRN
Start: 2021-02-14 — End: 2021-05-12

## 2021-02-14 MED ORDER — CEPHALEXIN 500 MG PO CAPS: 500.0000 mg | ORAL_CAPSULE | Freq: Three times a day (TID) | ORAL | 1 refills | Status: DC

## 2021-02-14 MED ORDER — CLINDAMYCIN PHOSPHATE 1 % EX LOTN: TOPICAL_LOTION | CUTANEOUS | 4 refills | Status: DC

## 2021-02-14 MED ORDER — LOSARTAN POTASSIUM 25 MG PO TABS
25.0000 mg | ORAL_TABLET | Freq: Every day | ORAL | 2 refills | Status: DC
Start: 2021-02-14 — End: 2021-08-11

## 2021-02-14 MED ORDER — AMLODIPINE BESYLATE 5 MG PO TABS
5.0000 mg | ORAL_TABLET | Freq: Every day | ORAL | 2 refills | Status: DC
Start: 2021-02-14 — End: 2021-08-11

## 2021-02-14 NOTE — Patient Instructions (Addendum)
Hidradenitis Suppurativa Hidradenitis suppurativa is a long-term (chronic) skin disease. It is similar to a severe form of acne, but it affects areas of the body where acne would be unusual, especially areas of the body where skin rubs against skin and becomes moist. These include:  Underarms.  Groin.  Genital area.  Buttocks.  Upper thighs.  Breasts. Hidradenitis suppurativa may start out as small lumps or pimples caused by blocked sweat glands or hair follicles. Pimples may develop into deep sores that break open (rupture) and drain pus. Over time, affected areas of skin may thicken and become scarred. This condition is rare and does not spread from person to person (non-contagious). What are the causes? The exact cause of this condition is not known. It may be related to:  Female and female hormones.  An overactive disease-fighting system (immune system). The immune system may over-react to blocked hair follicles or sweat glands and cause swelling and pus-filled sores. What increases the risk? You are more likely to develop this condition if you:  Are female.  Are 11-55 years old.  Have a family history of hidradenitis suppurativa.  Have a personal history of acne.  Are overweight.  Smoke.  Take the medicine lithium. What are the signs or symptoms? The first symptoms are usually painful bumps in the skin, similar to pimples. The condition may get worse over time (progress), or it may only cause mild symptoms. If the disease progresses, symptoms may include:  Skin bumps getting bigger and growing deeper into the skin.  Bumps rupturing and draining pus.  Itchy, infected skin.  Skin getting thicker and scarred.  Tunnels under the skin (fistulas) where pus drains from a bump.  Pain during daily activities, such as pain during walking if your groin area is affected.  Emotional problems, such as stress or depression. This condition may affect your appearance and your  ability or willingness to wear certain clothes or do certain activities. How is this diagnosed? This condition is diagnosed by a health care provider who specializes in skin diseases (dermatologist). You may be diagnosed based on:  Your symptoms and medical history.  A physical exam.  Testing a pus sample for infection.  Blood tests. How is this treated? Your treatment will depend on how severe your symptoms are. The same treatment will not work for everybody with this condition. You may need to try several treatments to find what works best for you. Treatment may include:  Cleaning and bandaging (dressing) your wounds as needed.  Lifestyle changes, such as new skin care routines.  Taking medicines, such as: ? Antibiotics. ? Acne medicines. ? Medicines to reduce the activity of the immune system. ? A diabetes medicine (metformin). ? Birth control pills, for women. ? Steroids to reduce swelling and pain.  Working with a mental health care provider, if you experience emotional distress due to this condition. If you have severe symptoms that do not get better with medicine, you may need surgery. Surgery may involve:  Using a laser to clear the skin and remove hair follicles.  Opening and draining deep sores.  Removing the areas of skin that are diseased and scarred. Follow these instructions at home: Medicines  Take over-the-counter and prescription medicines only as told by your health care provider.  If you were prescribed an antibiotic medicine, take it as told by your health care provider. Do not stop taking the antibiotic even if your condition improves.   Skin care  If you have open wounds,   cover them with a clean dressing as told by your health care provider. Keep wounds clean by washing them gently with soap and water when you bathe.  Do not shave the areas where you get hidradenitis suppurativa.  Do not wear deodorant.  Wear loose-fitting clothes.  Try to avoid  getting overheated or sweaty. If you get sweaty or wet, change into clean, dry clothes as soon as you can.  To help relieve pain and itchiness, cover sore areas with a warm, clean washcloth (warm compress) for 5-10 minutes as often as needed.  If told by your health care provider, take a bleach bath twice a week: ? Fill your bathtub halfway with water. ? Pour in  cup of unscented household bleach. ? Soak in the tub for 5-10 minutes. ? Only soak from the neck down. Avoid water on your face and hair. ? Shower to rinse off the bleach from your skin. General instructions  Learn as much as you can about your disease so that you have an active role in your treatment. Work closely with your health care provider to find treatments that work for you.  If you are overweight, work with your health care provider to lose weight as recommended.  Do not use any products that contain nicotine or tobacco, such as cigarettes and e-cigarettes. If you need help quitting, ask your health care provider.  If you struggle with living with this condition, talk with your health care provider or work with a mental health care provider as recommended.  Keep all follow-up visits as told by your health care provider. This is important. Where to find more information  Hidradenitis Suppurativa Foundation[employer] Inc.: https://www.hs-foundation.org/  American Academy of Dermatology: https://www.aad.org Contact a health care provider if you have:  A flare-up of hidradenitis suppurativa.  A fever or chills.  Trouble controlling your symptoms at home.  Trouble doing your daily activities because of your symptoms.  Trouble dealing with emotional problems related to your condition. Summary  Hidradenitis suppurativa is a long-term (chronic) skin disease. It is similar to a severe form of acne, but it affects areas of the body where acne would be unusual.  The first symptoms are usually painful bumps in the skin, similar  to pimples. The condition may only cause mild symptoms, or it may get worse over time (progress).  If you have open wounds, cover them with a clean dressing as told by your health care provider. Keep wounds clean by washing them gently with soap and water when you bathe.  Besides skin care, treatment may include medicines, laser treatment, and surgery. This information is not intended to replace advice given to you by your health care provider. Make sure you discuss any questions you have with your health care provider. Document Revised: 10/11/2020 Document Reviewed: 10/11/2020 Elsevier Patient Education  2021 [employer] Inc.  

## 2021-02-14 NOTE — Progress Notes (Addendum)
Subjective:    Patient ID: Grace [name], female    DOB: Aug 10, 1961, 60 y.o.   MRN: [id number]  Chief Complaint  Patient presents with  . Hypertension   Pt presents to the office today for chronic follow up.  She also has trigeminal neuralgia that comes and goes that can be a 10 out 10. Hypertension This is a chronic problem. The current episode started more than 1 year ago. The problem has been resolved since onset. The problem is controlled. Associated symptoms include shortness of breath ("normal"). Pertinent negatives include no malaise/fatigue or peripheral edema. Risk factors for coronary artery disease include dyslipidemia, obesity and sedentary lifestyle. The current treatment provides moderate improvement.  Insomnia Primary symptoms: difficulty falling asleep, no malaise/fatigue.  The current episode started more than one year. The onset quality is gradual. The problem occurs intermittently. The problem has been waxing and waning since onset.  Hyperlipidemia This is a chronic problem. The current episode started more than 1 year ago. The problem is uncontrolled. Exacerbating diseases include obesity. Associated symptoms include shortness of breath ("normal"). She is currently on no antihyperlipidemic treatment. The current treatment provides no improvement of lipids. Risk factors for coronary artery disease include dyslipidemia, hypertension, a sedentary lifestyle and post-menopausal.  Back Pain This is a chronic problem. The current episode started more than 1 year ago. The problem occurs intermittently. The problem has been waxing and waning since onset. The pain is present in the lumbar spine. The quality of the pain is described as aching. The pain is at a severity of 10/10. The pain is moderate. Pertinent negatives include no dysuria or fever. She has tried analgesics for the symptoms. The treatment provided moderate relief.  Nicotine Dependence Presents for follow-up visit.  Symptoms include insomnia. Her urge triggers include company of smokers. The symptoms have been stable. She smokes 1 pack of cigarettes per day.  Hidradenitis States this flares up in her groin, bilateral thighs. Uses warm compression.     Review of Systems  Constitutional: Negative for fever and malaise/fatigue.  Respiratory: Positive for shortness of breath ("normal").   Genitourinary: Negative for dysuria.  Musculoskeletal: Positive for back pain.  Psychiatric/Behavioral: The patient has insomnia.   All other systems reviewed and are negative.      Objective:   Physical Exam Vitals reviewed.  Constitutional:      General: She is not in acute distress.    Appearance: She is well-developed and well-nourished.  HENT:     Head: Normocephalic and atraumatic.     Right Ear: Tympanic membrane normal.     Left Ear: Tympanic membrane normal.     Mouth/Throat:     Mouth: Oropharynx is clear and moist.  Eyes:     Pupils: Pupils are equal, round, and reactive to light.  Neck:     Thyroid: No thyromegaly.  Cardiovascular:     Rate and Rhythm: Normal rate and regular rhythm.     Pulses: Intact distal pulses.     Heart sounds: Normal heart sounds. No murmur heard.   Pulmonary:     Effort: Pulmonary effort is normal. No respiratory distress.     Breath sounds: Normal breath sounds. No wheezing.  Abdominal:     General: Bowel sounds are normal. There is no distension.     Palpations: Abdomen is soft.     Tenderness: There is no abdominal tenderness.  Musculoskeletal:        General: No tenderness or edema.  Cervical back: Normal range of motion and neck supple.     Comments: Pain in lumbar with flexion and extension  Skin:    General: Skin is warm and dry.  Neurological:     Mental Status: She is alert and oriented to person, place, and time.     Cranial Nerves: No cranial nerve deficit.     Deep Tendon Reflexes: Reflexes are normal and symmetric.  Psychiatric:         Mood and Affect: Mood and affect normal.        Behavior: Behavior normal.        Thought Content: Thought content normal.        Judgment: Judgment normal.       BP 136/68   Pulse 83   Temp (!) 97.2 F (36.2 C) (Temporal)   Ht 5\' 5"  (1.651 m)   Wt 172 lb (78 kg)   BMI 28.62 kg/m      Assessment & Plan:  Grace [name] comes in today with chief complaint of Hypertension   Diagnosis and orders addressed:  1. Chronic bilateral low back pain, unspecified whether sciatica present - HYDROcodone-acetaminophen (NORCO) 10-325 MG tablet; Take 1 tablet by mouth every 8 (eight) hours as needed.  Dispense: 90 tablet; Refill: 0 - HYDROcodone-acetaminophen (NORCO) 10-325 MG tablet; Take 1 tablet by mouth every 8 (eight) hours as needed.  Dispense: 90 tablet; Refill: 0 - HYDROcodone-acetaminophen (NORCO) 10-325 MG tablet; Take 1 tablet by mouth every 8 (eight) hours as needed.  Dispense: 90 tablet; Refill: 0  2. Controlled substance agreement signed - HYDROcodone-acetaminophen (NORCO) 10-325 MG tablet; Take 1 tablet by mouth every 8 (eight) hours as needed.  Dispense: 90 tablet; Refill: 0 - HYDROcodone-acetaminophen (NORCO) 10-325 MG tablet; Take 1 tablet by mouth every 8 (eight) hours as needed.  Dispense: 90 tablet; Refill: 0 - HYDROcodone-acetaminophen (NORCO) 10-325 MG tablet; Take 1 tablet by mouth every 8 (eight) hours as needed.  Dispense: 90 tablet; Refill: 0  3. Uncomplicated opioid dependence (HCC) - HYDROcodone-acetaminophen (NORCO) 10-325 MG tablet; Take 1 tablet by mouth every 8 (eight) hours as needed.  Dispense: 90 tablet; Refill: 0 - HYDROcodone-acetaminophen (NORCO) 10-325 MG tablet; Take 1 tablet by mouth every 8 (eight) hours as needed.  Dispense: 90 tablet; Refill: 0 - HYDROcodone-acetaminophen (NORCO) 10-325 MG tablet; Take 1 tablet by mouth every 8 (eight) hours as needed.  Dispense: 90 tablet; Refill: 0  4. Trigeminal neuralgia - HYDROcodone-acetaminophen (NORCO)  10-325 MG tablet; Take 1 tablet by mouth every 8 (eight) hours as needed.  Dispense: 90 tablet; Refill: 0 - HYDROcodone-acetaminophen (NORCO) 10-325 MG tablet; Take 1 tablet by mouth every 8 (eight) hours as needed.  Dispense: 90 tablet; Refill: 0 - HYDROcodone-acetaminophen (NORCO) 10-325 MG tablet; Take 1 tablet by mouth every 8 (eight) hours as needed.  Dispense: 90 tablet; Refill: 0  5. Essential hypertension - losartan (COZAAR) 25 MG tablet; Take 1 tablet (25 mg total) by mouth daily.  Dispense: 90 tablet; Refill: 2 - amLODipine (NORVASC) 5 MG tablet; Take 1 tablet (5 mg total) by mouth daily.  Dispense: 90 tablet; Refill: 2  6. Hidradenitis - clindamycin (CLEOCIN T) 1 % lotion; APPLY TOPICALLY TWO TIMES A DAY. Keep on file until patient calls.  Dispense: 60 mL; Refill: 4 - cephALEXin (KEFLEX) 500 MG capsule; Take 1 capsule (500 mg total) by mouth 3 (three) times daily for 7 days.  Dispense: 21 capsule; Refill: 1  7. Primary hypertension  8. Pure hypercholesterolemia  9. Vitamin D deficiency  10. Tobacco user  11. Insomnia, unspecified type   PT would like to hold labs until next visit.  Health Maintenance reviewed Diet and exercise encouraged  Follow up plan: 3 months as CPE   Jannifer Rodney, FNP

## 2021-05-02 ENCOUNTER — Other Ambulatory Visit: Payer: Self-pay | Admitting: Family

## 2021-05-02 DIAGNOSIS — Z1231 Encounter for screening mammogram for malignant neoplasm of breast: Secondary | ICD-10-CM

## 2021-05-03 ENCOUNTER — Encounter: Payer: Self-pay | Admitting: Family Medicine

## 2021-05-12 ENCOUNTER — Encounter: Payer: Self-pay | Admitting: Family

## 2021-05-12 ENCOUNTER — Other Ambulatory Visit: Payer: Self-pay

## 2021-05-12 ENCOUNTER — Ambulatory Visit (INDEPENDENT_AMBULATORY_CARE_PROVIDER_SITE_OTHER): Payer: 59 | Admitting: Family

## 2021-05-12 VITALS — BP 142/74 | HR 80 | Temp 97.6°F | Resp 20 | Ht 65.0 in | Wt 170.4 lb

## 2021-05-12 DIAGNOSIS — Z0001 Encounter for general adult medical examination with abnormal findings: Secondary | ICD-10-CM | POA: Diagnosis not present

## 2021-05-12 DIAGNOSIS — L732 Hidradenitis suppurativa: Secondary | ICD-10-CM

## 2021-05-12 DIAGNOSIS — Z0000 Encounter for general adult medical examination without abnormal findings: Secondary | ICD-10-CM

## 2021-05-12 DIAGNOSIS — E7800 Pure hypercholesterolemia, unspecified: Secondary | ICD-10-CM

## 2021-05-12 DIAGNOSIS — F1120 Opioid dependence, uncomplicated: Secondary | ICD-10-CM

## 2021-05-12 DIAGNOSIS — Z720 Tobacco use: Secondary | ICD-10-CM

## 2021-05-12 DIAGNOSIS — G4700 Insomnia, unspecified: Secondary | ICD-10-CM

## 2021-05-12 DIAGNOSIS — Z79899 Other long term (current) drug therapy: Secondary | ICD-10-CM | POA: Diagnosis not present

## 2021-05-12 DIAGNOSIS — M545 Low back pain, unspecified: Secondary | ICD-10-CM

## 2021-05-12 DIAGNOSIS — I10 Essential (primary) hypertension: Secondary | ICD-10-CM

## 2021-05-12 DIAGNOSIS — G500 Trigeminal neuralgia: Secondary | ICD-10-CM

## 2021-05-12 DIAGNOSIS — G8929 Other chronic pain: Secondary | ICD-10-CM

## 2021-05-12 DIAGNOSIS — E559 Vitamin D deficiency, unspecified: Secondary | ICD-10-CM

## 2021-05-12 MED ORDER — CEPHALEXIN 500 MG PO CAPS: 500.0000 mg | ORAL_CAPSULE | Freq: Three times a day (TID) | ORAL | 1 refills | Status: DC

## 2021-05-12 MED ORDER — HYDROCODONE-ACETAMINOPHEN 10-325 MG PO TABS: 1.0000 | ORAL_TABLET | Freq: Three times a day (TID) | ORAL | 0 refills | Status: DC | PRN

## 2021-05-12 NOTE — Progress Notes (Addendum)
Subjective:    Patient ID: Grace [name], female    DOB: September 24, 1961, 60 y.o.   MRN: [id number]  Chief Complaint  Patient presents with  . Annual Exam   Pt presents to the office today for CPE without pap and pain medication refill.  She has trigeminal neuralgia that comes and goes that can be a 10 out 10.  She has hx of hidradenitis and has take antibiotics as needed. She does need a refill on this as she is having an abscess in her groin.  Hypertension This is a chronic problem. The current episode started more than 1 year ago. The problem has been resolved since onset. The problem is controlled. Associated symptoms include malaise/fatigue, peripheral edema (Some times) and shortness of breath. Risk factors for coronary artery disease include dyslipidemia, obesity, sedentary lifestyle and smoking/tobacco exposure. The current treatment provides moderate improvement. There is no history of CVA or heart failure.  Back Pain This is a chronic problem. The current episode started more than 1 year ago. The problem occurs intermittently. The problem has been waxing and waning since onset. The pain is present in the lumbar spine. The quality of the pain is described as aching. The pain is at a severity of 3/10. The pain is moderate. The symptoms are aggravated by standing. Risk factors include sedentary lifestyle. She has tried analgesics and bed rest for the symptoms. The treatment provided mild relief.  Hyperlipidemia This is a chronic problem. The current episode started more than 1 year ago. The problem is uncontrolled. Exacerbating diseases include obesity. Associated symptoms include shortness of breath. Current antihyperlipidemic treatment includes diet change. The current treatment provides mild improvement of lipids. Risk factors for coronary artery disease include dyslipidemia, hypertension, a sedentary lifestyle and post-menopausal.  Insomnia Primary symptoms: difficulty falling asleep,  frequent awakening, malaise/fatigue.       Review of Systems  Constitutional: Positive for malaise/fatigue.  Respiratory: Positive for shortness of breath.   Musculoskeletal: Positive for back pain.  Psychiatric/Behavioral: The patient has insomnia.   All other systems reviewed and are negative.  Family History  Problem Relation Age of Onset  . Hypertension Mother   . Dementia Mother   . Alcohol abuse Mother   . Cancer Father        lung   Social History   Socioeconomic History  . Marital status: Divorced    Spouse name: Not on file  . Number of children: Not on file  . Years of education: Not on file  . Highest education level: Not on file  Occupational History  . Not on file  Tobacco Use  . Smoking status: Current Every Day Smoker    Packs/day: 1.00    Types: Cigarettes  . Smokeless tobacco: Never Used  Vaping Use  . Vaping Use: Never used  Substance and Sexual Activity  . Alcohol use: No  . Drug use: No  . Sexual activity: Not on file  Other Topics Concern  . Not on file  Social History Narrative  . Not on file   Social Determinants of Health   Financial Resource Strain: Not on file  Food Insecurity: Not on file  Transportation Needs: Not on file  Physical Activity: Not on file  Stress: Not on file  Social Connections: Not on file       Objective:   Physical Exam Vitals reviewed.  Constitutional:      General: She is not in acute distress.    Appearance: She is  well-developed.  HENT:     Head: Normocephalic and atraumatic.     Right Ear: Tympanic membrane normal.     Left Ear: Tympanic membrane normal.  Eyes:     Pupils: Pupils are equal, round, and reactive to light.  Neck:     Thyroid: No thyromegaly.  Cardiovascular:     Rate and Rhythm: Normal rate and regular rhythm.     Heart sounds: Normal heart sounds. No murmur heard.   Pulmonary:     Effort: Pulmonary effort is normal. No respiratory distress.     Breath sounds: Normal breath  sounds. No wheezing.  Abdominal:     General: Bowel sounds are normal. There is no distension.     Palpations: Abdomen is soft.     Tenderness: There is no abdominal tenderness.  Musculoskeletal:        General: Tenderness present.     Cervical back: Normal range of motion and neck supple.     Comments: Pain in lower back with flexion and extension  Skin:    General: Skin is warm and dry.     Findings: Abscess (bilateral groin) present.  Neurological:     Mental Status: She is alert and oriented to person, place, and time.     Cranial Nerves: No cranial nerve deficit.     Deep Tendon Reflexes: Reflexes are normal and symmetric.  Psychiatric:        Behavior: Behavior normal.        Thought Content: Thought content normal.        Judgment: Judgment normal.     BP (!) 142/74   Pulse 80   Temp 97.6 F (36.4 C) (Temporal)   Resp 20   Ht 5' 5" (1.651 m)   Wt 170 lb 6 oz (77.3 kg)   SpO2 97%   BMI 28.35 kg/m        Assessment & Plan:  Grace [name] comes in today with chief complaint of Annual Exam   Diagnosis and orders addressed:  1. Chronic bilateral low back pain, unspecified whether sciatica present - HYDROcodone-acetaminophen (NORCO) 10-325 MG tablet; Take 1 tablet by mouth every 8 (eight) hours as needed.  Dispense: 90 tablet; Refill: 0 - HYDROcodone-acetaminophen (NORCO) 10-325 MG tablet; Take 1 tablet by mouth every 8 (eight) hours as needed.  Dispense: 90 tablet; Refill: 0 - HYDROcodone-acetaminophen (NORCO) 10-325 MG tablet; Take 1 tablet by mouth every 8 (eight) hours as needed.  Dispense: 90 tablet; Refill: 0 - CMP14+EGFR - CBC with Differential/Platelet - ToxASSURE Select 13 (MW), Urine  2. Controlled substance agreement signed - HYDROcodone-acetaminophen (NORCO) 10-325 MG tablet; Take 1 tablet by mouth every 8 (eight) hours as needed.  Dispense: 90 tablet; Refill: 0 - HYDROcodone-acetaminophen (NORCO) 10-325 MG tablet; Take 1 tablet by mouth every 8  (eight) hours as needed.  Dispense: 90 tablet; Refill: 0 - HYDROcodone-acetaminophen (NORCO) 10-325 MG tablet; Take 1 tablet by mouth every 8 (eight) hours as needed.  Dispense: 90 tablet; Refill: 0 - CMP14+EGFR - CBC with Differential/Platelet - ToxASSURE Select 13 (MW), Urine  3. Uncomplicated opioid dependence (HCC) - HYDROcodone-acetaminophen (NORCO) 10-325 MG tablet; Take 1 tablet by mouth every 8 (eight) hours as needed.  Dispense: 90 tablet; Refill: 0 - HYDROcodone-acetaminophen (NORCO) 10-325 MG tablet; Take 1 tablet by mouth every 8 (eight) hours as needed.  Dispense: 90 tablet; Refill: 0 - HYDROcodone-acetaminophen (NORCO) 10-325 MG tablet; Take 1 tablet by mouth every 8 (eight) hours as needed.  Dispense: 90   tablet; Refill: 0 - CMP14+EGFR - CBC with Differential/Platelet - ToxASSURE Select 13 (MW), Urine  4. Trigeminal neuralgia - HYDROcodone-acetaminophen (NORCO) 10-325 MG tablet; Take 1 tablet by mouth every 8 (eight) hours as needed.  Dispense: 90 tablet; Refill: 0 - HYDROcodone-acetaminophen (NORCO) 10-325 MG tablet; Take 1 tablet by mouth every 8 (eight) hours as needed.  Dispense: 90 tablet; Refill: 0 - HYDROcodone-acetaminophen (NORCO) 10-325 MG tablet; Take 1 tablet by mouth every 8 (eight) hours as needed.  Dispense: 90 tablet; Refill: 0 - CMP14+EGFR - CBC with Differential/Platelet - ToxASSURE Select 13 (MW), Urine  5. Primary hypertension - CMP14+EGFR - CBC with Differential/Platelet  6. Vitamin D deficiency - CMP14+EGFR - CBC with Differential/Platelet  7. Tobacco user - CMP14+EGFR - CBC with Differential/Platelet  8. Insomnia, unspecified type - CMP14+EGFR - CBC with Differential/Platelet  9. Pure hypercholesterolemia - CMP14+EGFR - CBC with Differential/Platelet - Lipid panel  10. Annual physical exam - CMP14+EGFR - CBC with Differential/Platelet - Lipid panel - TSH  11. Chronic low back pain, unspecified back pain laterality, unspecified  whether sciatica present - HYDROcodone-acetaminophen (NORCO) 10-325 MG tablet; Take 1 tablet by mouth every 8 (eight) hours as needed.  Dispense: 90 tablet; Refill: 0 - HYDROcodone-acetaminophen (NORCO) 10-325 MG tablet; Take 1 tablet by mouth every 8 (eight) hours as needed.  Dispense: 90 tablet; Refill: 0 - HYDROcodone-acetaminophen (NORCO) 10-325 MG tablet; Take 1 tablet by mouth every 8 (eight) hours as needed.  Dispense: 90 tablet; Refill: 0 - ToxASSURE Select 13 (MW), Urine  12. Hidradenitis - cephALEXin (KEFLEX) 500 MG capsule; Take 1 capsule (500 mg total) by mouth 3 (three) times daily for 7 days.  Dispense: 21 capsule; Refill: 1   Labs pending Patient reviewed in [place] controlled database, no flags noted. Contract and drug screen  up to dated today.  Health Maintenance reviewed Diet and exercise encouraged  Follow up plan: 3 months    Evelina Dun, FNP

## 2021-05-12 NOTE — Patient Instructions (Signed)
Health Maintenance, Female Adopting a healthy lifestyle and getting preventive care are important in promoting health and wellness. Ask your health care provider about:  The right schedule for you to have regular tests and exams.  Things you can do on your own to prevent diseases and keep yourself healthy. What should I know about diet, weight, and exercise? Eat a healthy diet  Eat a diet that includes plenty of vegetables, fruits, low-fat dairy products, and lean protein.  Do not eat a lot of foods that are high in solid fats, added sugars, or sodium.   Maintain a healthy weight Body mass index (BMI) is used to identify weight problems. It estimates body fat based on height and weight. Your health care provider can help determine your BMI and help you achieve or maintain a healthy weight. Get regular exercise Get regular exercise. This is one of the most important things you can do for your health. Most adults should:  Exercise for at least 150 minutes each week. The exercise should increase your heart rate and make you sweat (moderate-intensity exercise).  Do strengthening exercises at least twice a week. This is in addition to the moderate-intensity exercise.  Spend less time sitting. Even light physical activity can be beneficial. Watch cholesterol and blood lipids Have your blood tested for lipids and cholesterol at 62 years of age, then have this test every 5 years. Have your cholesterol levels checked more often if:  Your lipid or cholesterol levels are high.  You are older than 62 years of age.  You are at high risk for heart disease. What should I know about cancer screening? Depending on your health history and family history, you may need to have cancer screening at various ages. This may include screening for:  Breast cancer.  Cervical cancer.  Colorectal cancer.  Skin cancer.  Lung cancer. What should I know about heart disease, diabetes, and high blood  pressure? Blood pressure and heart disease  High blood pressure causes heart disease and increases the risk of stroke. This is more likely to develop in people who have high blood pressure readings, are of African descent, or are overweight.  Have your blood pressure checked: ? Every 3-5 years if you are 18-39 years of age. ? Every year if you are 40 years old or older. Diabetes Have regular diabetes screenings. This checks your fasting blood sugar level. Have the screening done:  Once every three years after age 40 if you are at a normal weight and have a low risk for diabetes.  More often and at a younger age if you are overweight or have a high risk for diabetes. What should I know about preventing infection? Hepatitis B If you have a higher risk for hepatitis B, you should be screened for this virus. Talk with your health care provider to find out if you are at risk for hepatitis B infection. Hepatitis C Testing is recommended for:  Everyone born from 1945 through 1965.  Anyone with known risk factors for hepatitis C. Sexually transmitted infections (STIs)  Get screened for STIs, including gonorrhea and chlamydia, if: ? You are sexually active and are younger than 62 years of age. ? You are older than 62 years of age and your health care provider tells you that you are at risk for this type of infection. ? Your sexual activity has changed since you were last screened, and you are at increased risk for chlamydia or gonorrhea. Ask your health care provider   if you are at risk.  Ask your health care provider about whether you are at high risk for HIV. Your health care provider may recommend a prescription medicine to help prevent HIV infection. If you choose to take medicine to prevent HIV, you should first get tested for HIV. You should then be tested every 3 months for as long as you are taking the medicine. Pregnancy  If you are about to stop having your period (premenopausal) and  you may become pregnant, seek counseling before you get pregnant.  Take 400 to 800 micrograms (mcg) of folic acid every day if you become pregnant.  Ask for birth control (contraception) if you want to prevent pregnancy. Osteoporosis and menopause Osteoporosis is a disease in which the bones lose minerals and strength with aging. This can result in bone fractures. If you are 65 years old or older, or if you are at risk for osteoporosis and fractures, ask your health care provider if you should:  Be screened for bone loss.  Take a calcium or vitamin D supplement to lower your risk of fractures.  Be given hormone replacement therapy (HRT) to treat symptoms of menopause. Follow these instructions at home: Lifestyle  Do not use any products that contain nicotine or tobacco, such as cigarettes, e-cigarettes, and chewing tobacco. If you need help quitting, ask your health care provider.  Do not use street drugs.  Do not share needles.  Ask your health care provider for help if you need support or information about quitting drugs. Alcohol use  Do not drink alcohol if: ? Your health care provider tells you not to drink. ? You are pregnant, may be pregnant, or are planning to become pregnant.  If you drink alcohol: ? Limit how much you use to 0-1 drink a day. ? Limit intake if you are breastfeeding.  Be aware of how much alcohol is in your drink. In the [place] one drink equals one 12 oz bottle of beer (355 mL), one 5 oz glass of wine (148 mL), or one 1 oz glass of hard liquor (44 mL). General instructions  Schedule regular health, dental, and eye exams.  Stay current with your vaccines.  Tell your health care provider if: ? You often feel depressed. ? You have ever been abused or do not feel safe at home. Summary  Adopting a healthy lifestyle and getting preventive care are important in promoting health and wellness.  Follow your health care provider's instructions about healthy  diet, exercising, and getting tested or screened for diseases.  Follow your health care provider's instructions on monitoring your cholesterol and blood pressure. This information is not intended to replace advice given to you by your health care provider. Make sure you discuss any questions you have with your health care provider. Document Revised: 12/10/2018 Document Reviewed: 12/10/2018 Elsevier Patient Education  2021 [employer] Inc[employer]  

## 2021-05-13 LAB — CMP14+EGFR
ALT: 9 IU/L (ref 0–32)
AST: 10 IU/L (ref 0–40)
Albumin/Globulin Ratio: 1.6 (ref 1.2–2.2)
Albumin: 4 g/dL (ref 3.8–4.8)
Alkaline Phosphatase: 68 IU/L (ref 44–121)
BUN/Creatinine Ratio: 12 (ref 12–28)
BUN: 11 mg/dL (ref 8–27)
Bilirubin Total: <0.2 mg/dL (ref 0.0–1.2)
CO2: 24 mmol/L (ref 20–29)
Calcium: 9.2 mg/dL (ref 8.7–10.3)
Chloride: 100 mmol/L (ref 96–106)
Creatinine, Ser: 0.91 mg/dL (ref 0.57–1.00)
Globulin, Total: 2.5 g/dL (ref 1.5–4.5)
Glucose: 84 mg/dL (ref 65–99)
Potassium: 4.3 mmol/L (ref 3.5–5.2)
Sodium: 138 mmol/L (ref 134–144)
Total Protein: 6.5 g/dL (ref 6.0–8.5)
eGFR: 72 mL/min/{1.73_m2} (ref >59)

## 2021-05-13 LAB — CBC WITH DIFFERENTIAL/PLATELET
Basophils Absolute: 0 10*3/uL (ref 0.0–0.2)
Basos: 1 %
EOS (ABSOLUTE): 0.3 10*3/uL (ref 0.0–0.4)
Eos: 4 %
Hematocrit: 39.5 % (ref 34.0–46.6)
Hemoglobin: 13.5 g/dL (ref 11.1–15.9)
Immature Grans (Abs): 0 10*3/uL (ref 0.0–0.1)
Immature Granulocytes: 0 %
Lymphocytes Absolute: 2.4 10*3/uL (ref 0.7–3.1)
Lymphs: 41 %
MCH: 30.7 pg (ref 26.6–33.0)
MCHC: 34.2 g/dL (ref 31.5–35.7)
MCV: 90 fL (ref 79–97)
Monocytes Absolute: 0.5 10*3/uL (ref 0.1–0.9)
Monocytes: 8 %
Neutrophils Absolute: 2.7 10*3/uL (ref 1.4–7.0)
Neutrophils: 46 %
Platelets: 287 10*3/uL (ref 150–450)
RBC: 4.4 x10E6/uL (ref 3.77–5.28)
RDW: 12.4 % (ref 11.7–15.4)
WBC: 5.9 10*3/uL (ref 3.4–10.8)

## 2021-05-13 LAB — LIPID PANEL
Chol/HDL Ratio: 5.8 ratio — ABNORMAL HIGH (ref 0.0–4.4)
Cholesterol, Total: 225 mg/dL — ABNORMAL HIGH (ref 100–199)
HDL: 39 mg/dL — ABNORMAL LOW (ref >39)
LDL Chol Calc (NIH): 144 mg/dL — ABNORMAL HIGH (ref 0–99)
Triglycerides: 229 mg/dL — ABNORMAL HIGH (ref 0–149)
VLDL Cholesterol Cal: 42 mg/dL — ABNORMAL HIGH (ref 5–40)

## 2021-05-13 LAB — TSH: TSH: 2.13 u[IU]/mL (ref 0.450–4.500)

## 2021-05-15 ENCOUNTER — Other Ambulatory Visit: Payer: Self-pay | Admitting: Family Medicine

## 2021-05-15 ENCOUNTER — Other Ambulatory Visit: Payer: Self-pay | Admitting: Family

## 2021-05-15 MED ORDER — ROSUVASTATIN CALCIUM 10 MG PO TABS: 10.0000 mg | ORAL_TABLET | Freq: Every day | ORAL | 1 refills | Status: DC

## 2021-05-19 LAB — TOXASSURE SELECT 13 (MW), URINE

## 2021-06-28 ENCOUNTER — Ambulatory Visit
Admission: RE | Admit: 2021-06-28 | Discharge: 2021-06-28 | Disposition: A | Discharge status: Home / Self Care | Payer: 59 | Source: Ambulatory Visit | Attending: Family | Admitting: Family

## 2021-06-28 ENCOUNTER — Other Ambulatory Visit: Payer: Self-pay

## 2021-06-28 DIAGNOSIS — Z1231 Encounter for screening mammogram for malignant neoplasm of breast: Secondary | ICD-10-CM

## 2021-08-11 ENCOUNTER — Encounter: Payer: Self-pay | Admitting: Family

## 2021-08-11 ENCOUNTER — Other Ambulatory Visit: Payer: Self-pay

## 2021-08-11 ENCOUNTER — Ambulatory Visit: Payer: 59 | Admitting: Family

## 2021-08-11 ENCOUNTER — Telehealth: Payer: Self-pay | Admitting: Family

## 2021-08-11 VITALS — BP 124/74 | HR 75 | Temp 97.4°F | Ht 65.0 in | Wt 165.6 lb

## 2021-08-11 DIAGNOSIS — F1120 Opioid dependence, uncomplicated: Secondary | ICD-10-CM | POA: Diagnosis not present

## 2021-08-11 DIAGNOSIS — M5430 Sciatica, unspecified side: Secondary | ICD-10-CM

## 2021-08-11 DIAGNOSIS — E7800 Pure hypercholesterolemia, unspecified: Secondary | ICD-10-CM

## 2021-08-11 DIAGNOSIS — Z79899 Other long term (current) drug therapy: Secondary | ICD-10-CM

## 2021-08-11 DIAGNOSIS — L732 Hidradenitis suppurativa: Secondary | ICD-10-CM

## 2021-08-11 DIAGNOSIS — G8929 Other chronic pain: Secondary | ICD-10-CM

## 2021-08-11 DIAGNOSIS — G500 Trigeminal neuralgia: Secondary | ICD-10-CM

## 2021-08-11 DIAGNOSIS — Z720 Tobacco use: Secondary | ICD-10-CM

## 2021-08-11 DIAGNOSIS — M545 Low back pain, unspecified: Secondary | ICD-10-CM

## 2021-08-11 DIAGNOSIS — I10 Essential (primary) hypertension: Secondary | ICD-10-CM

## 2021-08-11 DIAGNOSIS — L02234 Carbuncle of groin: Secondary | ICD-10-CM

## 2021-08-11 DIAGNOSIS — G4700 Insomnia, unspecified: Secondary | ICD-10-CM

## 2021-08-11 DIAGNOSIS — E559 Vitamin D deficiency, unspecified: Secondary | ICD-10-CM

## 2021-08-11 MED ORDER — DICLOFENAC SODIUM 75 MG PO TBEC: 75.0000 mg | DELAYED_RELEASE_TABLET | Freq: Two times a day (BID) | ORAL | 2 refills | Status: DC

## 2021-08-11 MED ORDER — TRAZODONE HCL 50 MG PO TABS: 50.0000 mg | ORAL_TABLET | Freq: Every evening | ORAL | 11 refills | Status: DC | PRN

## 2021-08-11 MED ORDER — CEPHALEXIN 500 MG PO CAPS: 500.0000 mg | ORAL_CAPSULE | Freq: Three times a day (TID) | ORAL | 1 refills | Status: DC

## 2021-08-11 MED ORDER — LOSARTAN POTASSIUM 25 MG PO TABS: 25.0000 mg | ORAL_TABLET | Freq: Every day | ORAL | 2 refills | Status: DC

## 2021-08-11 MED ORDER — HYDROCODONE-ACETAMINOPHEN 10-325 MG PO TABS: 1.0000 | ORAL_TABLET | Freq: Three times a day (TID) | ORAL | 0 refills | Status: DC | PRN

## 2021-08-11 MED ORDER — GABAPENTIN 100 MG PO CAPS: 100.0000 mg | ORAL_CAPSULE | Freq: Three times a day (TID) | ORAL | 3 refills | Status: DC

## 2021-08-11 MED ORDER — AMLODIPINE BESYLATE 5 MG PO TABS: 5.0000 mg | ORAL_TABLET | Freq: Every day | ORAL | 2 refills | Status: DC

## 2021-08-11 NOTE — Patient Instructions (Signed)
Health Maintenance, Female Adopting a healthy lifestyle and getting preventive care are important in promoting health and wellness. Ask your health care provider about: The right schedule for you to have regular tests and exams. Things you can do on your own to prevent diseases and keep yourself healthy. What should I know about diet, weight, and exercise? Eat a healthy diet  Eat a diet that includes plenty of vegetables, fruits, low-fat dairy products, and lean protein. Do not eat a lot of foods that are high in solid fats, added sugars, or sodium.  Maintain a healthy weight Body mass index (BMI) is used to identify weight problems. It estimates body fat based on height and weight. Your health care provider can help determineyour BMI and help you achieve or maintain a healthy weight. Get regular exercise Get regular exercise. This is one of the most important things you can do for your health. Most adults should: Exercise for at least 150 minutes each week. The exercise should increase your heart rate and make you sweat (moderate-intensity exercise). Do strengthening exercises at least twice a week. This is in addition to the moderate-intensity exercise. Spend less time sitting. Even light physical activity can be beneficial. Watch cholesterol and blood lipids Have your blood tested for lipids and cholesterol at 60 years of age, then havethis test every 5 years. Have your cholesterol levels checked more often if: Your lipid or cholesterol levels are high. You are older than 62 years of age. You are at high risk for heart disease. What should I know about cancer screening? Depending on your health history and family history, you may need to have cancer screening at various ages. This may include screening for: Breast cancer. Cervical cancer. Colorectal cancer. Skin cancer. Lung cancer. What should I know about heart disease, diabetes, and high blood pressure? Blood pressure and heart  disease High blood pressure causes heart disease and increases the risk of stroke. This is more likely to develop in people who have high blood pressure readings, are of African descent, or are overweight. Have your blood pressure checked: Every 3-5 years if you are 18-39 years of age. Every year if you are 40 years old or older. Diabetes Have regular diabetes screenings. This checks your fasting blood sugar level. Have the screening done: Once every three years after age 40 if you are at a normal weight and have a low risk for diabetes. More often and at a younger age if you are overweight or have a high risk for diabetes. What should I know about preventing infection? Hepatitis B If you have a higher risk for hepatitis B, you should be screened for this virus. Talk with your health care provider to find out if you are at risk forhepatitis B infection. Hepatitis C Testing is recommended for: Everyone born from 1945 through 1965. Anyone with known risk factors for hepatitis C. Sexually transmitted infections (STIs) Get screened for STIs, including gonorrhea and chlamydia, if: You are sexually active and are younger than 62 years of age. You are older than 62 years of age and your health care provider tells you that you are at risk for this type of infection. Your sexual activity has changed since you were last screened, and you are at increased risk for chlamydia or gonorrhea. Ask your health care provider if you are at risk. Ask your health care provider about whether you are at high risk for HIV. Your health care provider may recommend a prescription medicine to help   prevent HIV infection. If you choose to take medicine to prevent HIV, you should first get tested for HIV. You should then be tested every 3 months for as long as you are taking the medicine. Pregnancy If you are about to stop having your period (premenopausal) and you may become pregnant, seek counseling before you get  pregnant. Take 400 to 800 micrograms (mcg) of folic acid every day if you become pregnant. Ask for birth control (contraception) if you want to prevent pregnancy. Osteoporosis and menopause Osteoporosis is a disease in which the bones lose minerals and strength with aging. This can result in bone fractures. If you are 65 years old or older, or if you are at risk for osteoporosis and fractures, ask your health care provider if you should: Be screened for bone loss. Take a calcium or vitamin D supplement to lower your risk of fractures. Be given hormone replacement therapy (HRT) to treat symptoms of menopause. Follow these instructions at home: Lifestyle Do not use any products that contain nicotine or tobacco, such as cigarettes, e-cigarettes, and chewing tobacco. If you need help quitting, ask your health care provider. Do not use street drugs. Do not share needles. Ask your health care provider for help if you need support or information about quitting drugs. Alcohol use Do not drink alcohol if: Your health care provider tells you not to drink. You are pregnant, may be pregnant, or are planning to become pregnant. If you drink alcohol: Limit how much you use to 0-1 drink a day. Limit intake if you are breastfeeding. Be aware of how much alcohol is in your drink. In the [place] one drink equals one 12 oz bottle of beer (355 mL), one 5 oz glass of wine (148 mL), or one 1 oz glass of hard liquor (44 mL). General instructions Schedule regular health, dental, and eye exams. Stay current with your vaccines. Tell your health care provider if: You often feel depressed. You have ever been abused or do not feel safe at home. Summary Adopting a healthy lifestyle and getting preventive care are important in promoting health and wellness. Follow your health care provider's instructions about healthy diet, exercising, and getting tested or screened for diseases. Follow your health care provider's  instructions on monitoring your cholesterol and blood pressure. This information is not intended to replace advice given to you by your health care provider. Make sure you discuss any questions you have with your healthcare provider. Document Revised: 12/10/2018 Document Reviewed: 12/10/2018 Elsevier Patient Education  2022 Elsevier Inc.  

## 2021-08-11 NOTE — Addendum Note (Signed)
Addended by: Jannifer Rodney A on: 08/11/2021 09:35 AM   Modules accepted: Orders

## 2021-08-11 NOTE — Progress Notes (Signed)
Subjective:    Patient ID: Grace [name], female    DOB: [birth year], 60 y.o.   MRN: [id number]  Chief Complaint  Patient presents with   Medical Management of Chronic Issues   Pt presents to the office today for chronic follow and pain medication refill.  She has trigeminal neuralgia that comes and goes that can be a 10 out 10.   She has hx of hidradenitis and has take antibiotics as needed. She does need a refill on this as she is having an abscess in her groin.  Hypertension This is a chronic problem. The current episode started more than 1 year ago. The problem has been resolved since onset. Pertinent negatives include no malaise/fatigue, peripheral edema or shortness of breath. Risk factors for coronary artery disease include dyslipidemia and obesity. The current treatment provides moderate improvement.  Nicotine Dependence Presents for follow-up visit. Symptoms include insomnia. Her urge triggers include company of smokers. The symptoms have been stable. She smokes 1 pack of cigarettes per day.  Insomnia Primary symptoms: difficulty falling asleep, frequent awakening, no malaise/fatigue.   The current episode started more than one year. The onset quality is gradual. The problem occurs intermittently. The treatment provided moderate relief.  Back Pain This is a chronic problem. The current episode started more than 1 year ago. The problem occurs intermittently. The problem has been waxing and waning since onset. The pain is present in the lumbar spine. The quality of the pain is described as aching. The pain is at a severity of 7/10. The pain is moderate. The pain is Worse during the day. The symptoms are aggravated by standing and bending. Risk factors include obesity. She has tried analgesics for the symptoms. The treatment provided mild relief.     Review of Systems  Constitutional:  Negative for malaise/fatigue.  Respiratory:  Negative for shortness of breath.   Musculoskeletal:   Positive for back pain.  Psychiatric/Behavioral:  The patient has insomnia.   All other systems reviewed and are negative.     Objective:   Physical Exam Vitals reviewed.  Constitutional:      General: She is not in acute distress.    Appearance: She is well-developed. She is obese.  HENT:     Head: Normocephalic and atraumatic.     Right Ear: Tympanic membrane normal.     Left Ear: Tympanic membrane normal.  Eyes:     Pupils: Pupils are equal, round, and reactive to light.  Neck:     Thyroid: No thyromegaly.  Cardiovascular:     Rate and Rhythm: Normal rate and regular rhythm.     Heart sounds: Normal heart sounds. No murmur heard. Pulmonary:     Effort: Pulmonary effort is normal. No respiratory distress.     Breath sounds: Normal breath sounds. No wheezing.  Abdominal:     General: Bowel sounds are normal. There is no distension.     Palpations: Abdomen is soft.     Tenderness: There is no abdominal tenderness.  Musculoskeletal:        General: Tenderness (pain in lumbar with flexion) present. Normal range of motion.     Cervical back: Normal range of motion and neck supple.  Skin:    General: Skin is warm and dry.  Neurological:     Mental Status: She is alert and oriented to person, place, and time.     Cranial Nerves: No cranial nerve deficit.     Deep Tendon Reflexes: Reflexes are normal  and symmetric.  Psychiatric:        Behavior: Behavior normal.        Thought Content: Thought content normal.        Judgment: Judgment normal.       BP 124/74   Pulse 75   Temp (!) 97.4 F (36.3 C) (Temporal)   Ht 5\' 5"  (1.651 m)   Wt 165 lb 9.6 oz (75.1 kg)   SpO2 98%   BMI 27.56 kg/m     Assessment & Plan:  .Grace [name] comes in today with chief complaint of Medical Management of Chronic Issues   Diagnosis and orders addressed:  1. Chronic bilateral low back pain, unspecified whether sciatica present - HYDROcodone-acetaminophen (NORCO) 10-325 MG tablet;  Take 1 tablet by mouth every 8 (eight) hours as needed.  Dispense: 90 tablet; Refill: 0 - HYDROcodone-acetaminophen (NORCO) 10-325 MG tablet; Take 1 tablet by mouth every 8 (eight) hours as needed.  Dispense: 90 tablet; Refill: 0 - HYDROcodone-acetaminophen (NORCO) 10-325 MG tablet; Take 1 tablet by mouth every 8 (eight) hours as needed.  Dispense: 90 tablet; Refill: 0 - diclofenac (VOLTAREN) 75 MG EC tablet; Take 1 tablet (75 mg total) by mouth 2 (two) times daily.  Dispense: 60 tablet; Refill: 2  2. Controlled substance agreement signed - HYDROcodone-acetaminophen (NORCO) 10-325 MG tablet; Take 1 tablet by mouth every 8 (eight) hours as needed.  Dispense: 90 tablet; Refill: 0 - HYDROcodone-acetaminophen (NORCO) 10-325 MG tablet; Take 1 tablet by mouth every 8 (eight) hours as needed.  Dispense: 90 tablet; Refill: 0 - HYDROcodone-acetaminophen (NORCO) 10-325 MG tablet; Take 1 tablet by mouth every 8 (eight) hours as needed.  Dispense: 90 tablet; Refill: 0  3. Uncomplicated opioid dependence (HCC) - HYDROcodone-acetaminophen (NORCO) 10-325 MG tablet; Take 1 tablet by mouth every 8 (eight) hours as needed.  Dispense: 90 tablet; Refill: 0 - HYDROcodone-acetaminophen (NORCO) 10-325 MG tablet; Take 1 tablet by mouth every 8 (eight) hours as needed.  Dispense: 90 tablet; Refill: 0 - HYDROcodone-acetaminophen (NORCO) 10-325 MG tablet; Take 1 tablet by mouth every 8 (eight) hours as needed.  Dispense: 90 tablet; Refill: 0  4. Trigeminal neuralgia - HYDROcodone-acetaminophen (NORCO) 10-325 MG tablet; Take 1 tablet by mouth every 8 (eight) hours as needed.  Dispense: 90 tablet; Refill: 0 - HYDROcodone-acetaminophen (NORCO) 10-325 MG tablet; Take 1 tablet by mouth every 8 (eight) hours as needed.  Dispense: 90 tablet; Refill: 0 - HYDROcodone-acetaminophen (NORCO) 10-325 MG tablet; Take 1 tablet by mouth every 8 (eight) hours as needed.  Dispense: 90 tablet; Refill: 0 - traZODone (DESYREL) 50 MG tablet; Take  1-2 tablets (50-100 mg total) by mouth at bedtime as needed for sleep.  Dispense: 60 tablet; Refill: 11 - diclofenac (VOLTAREN) 75 MG EC tablet; Take 1 tablet (75 mg total) by mouth 2 (two) times daily.  Dispense: 60 tablet; Refill: 2  5. Chronic low back pain, unspecified back pain laterality, unspecified whether sciatica present - HYDROcodone-acetaminophen (NORCO) 10-325 MG tablet; Take 1 tablet by mouth every 8 (eight) hours as needed.  Dispense: 90 tablet; Refill: 0 - HYDROcodone-acetaminophen (NORCO) 10-325 MG tablet; Take 1 tablet by mouth every 8 (eight) hours as needed.  Dispense: 90 tablet; Refill: 0 - HYDROcodone-acetaminophen (NORCO) 10-325 MG tablet; Take 1 tablet by mouth every 8 (eight) hours as needed.  Dispense: 90 tablet; Refill: 0  6. Essential hypertension - losartan (COZAAR) 25 MG tablet; Take 1 tablet (25 mg total) by mouth daily.  Dispense: 90 tablet; Refill: 2 -  amLODipine (NORVASC) 5 MG tablet; Take 1 tablet (5 mg total) by mouth daily.  Dispense: 90 tablet; Refill: 2  7. Sciatic leg pain - gabapentin (NEURONTIN) 100 MG capsule; Take 1 capsule (100 mg total) by mouth 3 (three) times daily.  Dispense: 90 capsule; Refill: 3  8. Primary hypertension  9. Hidradenitis  10. Insomnia, unspecified type  11. Pure hypercholesterolemia   12. Tobacco user   13. Vitamin D deficiency   Patient reviewed in [place] controlled database, no flags noted. Contract and drug screen are up to date.  Health Maintenance reviewed Diet and exercise encouraged  Follow up plan: 3 months    Jannifer Rodney, FNP

## 2021-08-11 NOTE — Telephone Encounter (Signed)
Pt says that HYDROcodone-acetaminophen (NORCO) 10-325 MG tablet needs to be written for 30 days for each rx. Do not write it for 30/60/90 because it will one day short by second rx and two days short by third rx.  Pt always has an issues the way [place] has it written out with [facility] pharmacy

## 2021-08-14 MED ORDER — HYDROCODONE-ACETAMINOPHEN 10-325 MG PO TABS: 1.0000 | ORAL_TABLET | Freq: Three times a day (TID) | ORAL | 0 refills | Status: DC | PRN

## 2021-08-14 NOTE — Telephone Encounter (Signed)
Prescription sent to pharmacy.

## 2021-11-10 ENCOUNTER — Other Ambulatory Visit: Payer: Self-pay

## 2021-11-10 ENCOUNTER — Encounter: Payer: Self-pay | Admitting: Family

## 2021-11-10 ENCOUNTER — Ambulatory Visit: Payer: 59 | Admitting: Family

## 2021-11-10 VITALS — BP 130/61 | HR 86 | Temp 97.3°F | Ht 65.0 in | Wt 164.0 lb

## 2021-11-10 DIAGNOSIS — F1120 Opioid dependence, uncomplicated: Secondary | ICD-10-CM | POA: Diagnosis not present

## 2021-11-10 DIAGNOSIS — G8929 Other chronic pain: Secondary | ICD-10-CM

## 2021-11-10 DIAGNOSIS — L732 Hidradenitis suppurativa: Secondary | ICD-10-CM

## 2021-11-10 DIAGNOSIS — Z23 Encounter for immunization: Secondary | ICD-10-CM | POA: Diagnosis not present

## 2021-11-10 DIAGNOSIS — G500 Trigeminal neuralgia: Secondary | ICD-10-CM

## 2021-11-10 DIAGNOSIS — M5430 Sciatica, unspecified side: Secondary | ICD-10-CM

## 2021-11-10 DIAGNOSIS — G4700 Insomnia, unspecified: Secondary | ICD-10-CM

## 2021-11-10 DIAGNOSIS — M545 Low back pain, unspecified: Secondary | ICD-10-CM | POA: Diagnosis not present

## 2021-11-10 DIAGNOSIS — I10 Essential (primary) hypertension: Secondary | ICD-10-CM

## 2021-11-10 DIAGNOSIS — Z720 Tobacco use: Secondary | ICD-10-CM

## 2021-11-10 DIAGNOSIS — Z79899 Other long term (current) drug therapy: Secondary | ICD-10-CM | POA: Diagnosis not present

## 2021-11-10 DIAGNOSIS — E7800 Pure hypercholesterolemia, unspecified: Secondary | ICD-10-CM

## 2021-11-10 DIAGNOSIS — E559 Vitamin D deficiency, unspecified: Secondary | ICD-10-CM

## 2021-11-10 MED ORDER — HYDROCODONE-ACETAMINOPHEN 10-325 MG PO TABS: 1.0000 | ORAL_TABLET | Freq: Three times a day (TID) | ORAL | 0 refills | Status: DC | PRN

## 2021-11-10 MED ORDER — TRAZODONE HCL 150 MG PO TABS: 150.0000 mg | ORAL_TABLET | Freq: Every day | ORAL | 6 refills | Status: DC

## 2021-11-10 MED ORDER — CEPHALEXIN 500 MG PO CAPS: 500.0000 mg | ORAL_CAPSULE | Freq: Three times a day (TID) | ORAL | 1 refills | Status: AC

## 2021-11-10 MED ORDER — AMLODIPINE BESYLATE 5 MG PO TABS: 5.0000 mg | ORAL_TABLET | Freq: Every day | ORAL | 2 refills | Status: DC

## 2021-11-10 MED ORDER — DICLOFENAC SODIUM 75 MG PO TBEC: 75.0000 mg | DELAYED_RELEASE_TABLET | Freq: Two times a day (BID) | ORAL | 2 refills | Status: DC

## 2021-11-10 MED ORDER — GABAPENTIN 100 MG PO CAPS: 100.0000 mg | ORAL_CAPSULE | Freq: Three times a day (TID) | ORAL | 3 refills | Status: DC

## 2021-11-10 NOTE — Patient Instructions (Signed)

## 2021-11-10 NOTE — Progress Notes (Signed)
Subjective:    Patient ID: Grace [name], female    DOB: 1961-02-20, 60 y.o.   MRN: [id number]  Chief Complaint  Patient presents with   Medical Management of Chronic Issues   Pt presents to the office today for chronic follow and pain medication refill.  She has trigeminal neuralgia that comes and goes that can be a 10 out 10.   She has hx of hidradenitis and has take antibiotics as needed. She does need a refill on this as she is having an abscess in her groin. Hypertension This is a chronic problem. The current episode started more than 1 year ago. The problem has been resolved since onset. The problem is controlled. Associated symptoms include malaise/fatigue. Pertinent negatives include no peripheral edema or shortness of breath. Risk factors for coronary artery disease include dyslipidemia. The current treatment provides moderate improvement.  Hyperlipidemia This is a chronic problem. The problem is controlled. Pertinent negatives include no shortness of breath. Current antihyperlipidemic treatment includes statins. The current treatment provides moderate improvement of lipids. Risk factors for coronary artery disease include dyslipidemia.  Back Pain This is a chronic problem. The current episode started more than 1 year ago. The problem occurs intermittently. The problem has been waxing and waning since onset. The pain is present in the lumbar spine. The pain is at a severity of 2/10 (some days it is 8). The pain is moderate.  Nicotine Dependence Presents for follow-up visit. Her urge triggers include company of smokers. The symptoms have been stable. She smokes 1 pack of cigarettes per day.     Review of Systems  Constitutional:  Positive for malaise/fatigue.  Respiratory:  Negative for shortness of breath.   Musculoskeletal:  Positive for back pain.  All other systems reviewed and are negative.     Objective:   Physical Exam Vitals reviewed.  Constitutional:      General:  She is not in acute distress.    Appearance: She is well-developed.  HENT:     Head: Normocephalic and atraumatic.     Right Ear: Tympanic membrane normal.     Left Ear: Tympanic membrane normal.  Eyes:     Pupils: Pupils are equal, round, and reactive to light.  Neck:     Thyroid: No thyromegaly.  Cardiovascular:     Rate and Rhythm: Normal rate and regular rhythm.     Heart sounds: Normal heart sounds. No murmur heard. Pulmonary:     Effort: Pulmonary effort is normal. No respiratory distress.     Breath sounds: Normal breath sounds. No wheezing.  Abdominal:     General: Bowel sounds are normal. There is no distension.     Palpations: Abdomen is soft.     Tenderness: There is no abdominal tenderness.  Musculoskeletal:        General: No tenderness. Normal range of motion.     Cervical back: Normal range of motion and neck supple.  Skin:    General: Skin is warm and dry.  Neurological:     Mental Status: She is alert and oriented to person, place, and time.     Cranial Nerves: No cranial nerve deficit.     Deep Tendon Reflexes: Reflexes are normal and symmetric.  Psychiatric:        Behavior: Behavior normal.        Thought Content: Thought content normal.        Judgment: Judgment normal.       BP 130/61  Pulse 86   Temp (!) 97.3 F (36.3 C) (Temporal)   Ht 5\' 5"  (1.651 m)   Wt 164 lb (74.4 kg)   BMI 27.29 kg/m   Assessment & Plan:  [name] comes in today with chief complaint of Medical Management of Chronic Issues   Diagnosis and orders addressed:  1. Chronic bilateral low back pain, unspecified whether sciatica present - HYDROcodone-acetaminophen (NORCO) 10-325 MG tablet; Take 1 tablet by mouth every 8 (eight) hours as needed.  Dispense: 90 tablet; Refill: 0 - HYDROcodone-acetaminophen (NORCO) 10-325 MG tablet; Take 1 tablet by mouth every 8 (eight) hours as needed.  Dispense: 90 tablet; Refill: 0 - HYDROcodone-acetaminophen (NORCO) 10-325 MG tablet;  Take 1 tablet by mouth every 8 (eight) hours as needed.  Dispense: 90 tablet; Refill: 0 - diclofenac (VOLTAREN) 75 MG EC tablet; Take 1 tablet (75 mg total) by mouth 2 (two) times daily.  Dispense: 60 tablet; Refill: 2  2. Controlled substance agreement signed - HYDROcodone-acetaminophen (NORCO) 10-325 MG tablet; Take 1 tablet by mouth every 8 (eight) hours as needed.  Dispense: 90 tablet; Refill: 0 - HYDROcodone-acetaminophen (NORCO) 10-325 MG tablet; Take 1 tablet by mouth every 8 (eight) hours as needed.  Dispense: 90 tablet; Refill: 0 - HYDROcodone-acetaminophen (NORCO) 10-325 MG tablet; Take 1 tablet by mouth every 8 (eight) hours as needed.  Dispense: 90 tablet; Refill: 0  3. Uncomplicated opioid dependence (HCC) - HYDROcodone-acetaminophen (NORCO) 10-325 MG tablet; Take 1 tablet by mouth every 8 (eight) hours as needed.  Dispense: 90 tablet; Refill: 0 - HYDROcodone-acetaminophen (NORCO) 10-325 MG tablet; Take 1 tablet by mouth every 8 (eight) hours as needed.  Dispense: 90 tablet; Refill: 0 - HYDROcodone-acetaminophen (NORCO) 10-325 MG tablet; Take 1 tablet by mouth every 8 (eight) hours as needed.  Dispense: 90 tablet; Refill: 0  4. Trigeminal neuralgia  - HYDROcodone-acetaminophen (NORCO) 10-325 MG tablet; Take 1 tablet by mouth every 8 (eight) hours as needed.  Dispense: 90 tablet; Refill: 0 - HYDROcodone-acetaminophen (NORCO) 10-325 MG tablet; Take 1 tablet by mouth every 8 (eight) hours as needed.  Dispense: 90 tablet; Refill: 0 - HYDROcodone-acetaminophen (NORCO) 10-325 MG tablet; Take 1 tablet by mouth every 8 (eight) hours as needed.  Dispense: 90 tablet; Refill: 0 - diclofenac (VOLTAREN) 75 MG EC tablet; Take 1 tablet (75 mg total) by mouth 2 (two) times daily.  Dispense: 60 tablet; Refill: 2  5. Chronic low back pain, unspecified back pain laterality, unspecified whether sciatica present - HYDROcodone-acetaminophen (NORCO) 10-325 MG tablet; Take 1 tablet by mouth every 8 (eight)  hours as needed.  Dispense: 90 tablet; Refill: 0 - HYDROcodone-acetaminophen (NORCO) 10-325 MG tablet; Take 1 tablet by mouth every 8 (eight) hours as needed.  Dispense: 90 tablet; Refill: 0 - HYDROcodone-acetaminophen (NORCO) 10-325 MG tablet; Take 1 tablet by mouth every 8 (eight) hours as needed.  Dispense: 90 tablet; Refill: 0  6. Primary hypertension  7. Hidradenitis - cephALEXin (KEFLEX) 500 MG capsule; Take 1 capsule (500 mg total) by mouth 3 (three) times daily for 7 days.  Dispense: 21 capsule; Refill: 1  8. Insomnia, unspecified type - traZODone (DESYREL) 150 MG tablet; Take 1 tablet (150 mg total) by mouth at bedtime.  Dispense: 90 tablet; Refill: 6  9. Pure hypercholesterolemia  10. Vitamin D deficiency  11. Tobacco user  12. Essential hypertension - amLODipine (NORVASC) 5 MG tablet; Take 1 tablet (5 mg total) by mouth daily.  Dispense: 90 tablet; Refill: 2  13. Sciatic leg pain -  gabapentin (NEURONTIN) 100 MG capsule; Take 1 capsule (100 mg total) by mouth 3 (three) times daily.  Dispense: 90 capsule; Refill: 3   Patient reviewed in [place] controlled database, no flags noted. Contract and drug screen are up to date.  Health Maintenance reviewed Diet and exercise encouraged  Follow up plan: 3 months    Jannifer Rodney, FNP

## 2022-02-02 ENCOUNTER — Ambulatory Visit: Payer: 59 | Admitting: Family

## 2022-02-02 ENCOUNTER — Encounter: Payer: Self-pay | Admitting: Family

## 2022-02-02 VITALS — BP 117/62 | HR 84 | Temp 97.7°F | Ht 65.0 in | Wt 160.8 lb

## 2022-02-02 DIAGNOSIS — F1120 Opioid dependence, uncomplicated: Secondary | ICD-10-CM | POA: Diagnosis not present

## 2022-02-02 DIAGNOSIS — M545 Low back pain, unspecified: Secondary | ICD-10-CM | POA: Diagnosis not present

## 2022-02-02 DIAGNOSIS — I10 Essential (primary) hypertension: Secondary | ICD-10-CM | POA: Diagnosis not present

## 2022-02-02 DIAGNOSIS — E7800 Pure hypercholesterolemia, unspecified: Secondary | ICD-10-CM

## 2022-02-02 DIAGNOSIS — E559 Vitamin D deficiency, unspecified: Secondary | ICD-10-CM

## 2022-02-02 DIAGNOSIS — G500 Trigeminal neuralgia: Secondary | ICD-10-CM

## 2022-02-02 DIAGNOSIS — Z720 Tobacco use: Secondary | ICD-10-CM

## 2022-02-02 DIAGNOSIS — Z79899 Other long term (current) drug therapy: Secondary | ICD-10-CM

## 2022-02-02 DIAGNOSIS — G4700 Insomnia, unspecified: Secondary | ICD-10-CM

## 2022-02-02 DIAGNOSIS — G8929 Other chronic pain: Secondary | ICD-10-CM

## 2022-02-02 MED ORDER — HYDROCODONE-ACETAMINOPHEN 10-325 MG PO TABS: 1.0000 | ORAL_TABLET | Freq: Three times a day (TID) | ORAL | 0 refills | Status: DC | PRN

## 2022-02-02 NOTE — Progress Notes (Signed)
Subjective:    Patient ID: Grace [name], female    DOB: 1961/01/05, 61 y.o.   MRN: [id number]  Chief Complaint  Patient presents with   Medical Management of Chronic Issues   Back Pain   Pt presents to the office today for chronic follow and pain medication refill.  She has trigeminal neuralgia that comes and goes that can be a 10 out 10. Back Pain This is a chronic problem. The current episode started more than 1 year ago. The problem occurs intermittently. The problem has been waxing and waning since onset. The pain is present in the lumbar spine. The quality of the pain is described as aching. The pain is at a severity of 8/10. The pain is moderate. The symptoms are aggravated by standing and sitting. Pertinent negatives include no dysuria, headaches, perianal numbness or weakness. She has tried analgesics for the symptoms. The treatment provided mild relief.  Hypertension This is a chronic problem. The current episode started more than 1 year ago. The problem has been resolved since onset. The problem is controlled. Associated symptoms include shortness of breath. Pertinent negatives include no headaches, malaise/fatigue or peripheral edema. Risk factors for coronary artery disease include dyslipidemia and obesity. The current treatment provides moderate improvement.  Insomnia Primary symptoms: difficulty falling asleep, frequent awakening, no malaise/fatigue.   The problem has been waxing and waning since onset.  Hyperlipidemia This is a chronic problem. The current episode started more than 1 year ago. The problem is controlled. Exacerbating diseases include obesity. Associated symptoms include shortness of breath. Current antihyperlipidemic treatment includes statins. The current treatment provides moderate improvement of lipids. Risk factors for coronary artery disease include dyslipidemia, hypertension and a sedentary lifestyle.  Nicotine Dependence Presents for follow-up visit.  Symptoms include insomnia. Her urge triggers include company of smokers. The symptoms have been stable. She smokes 1 pack of cigarettes per day.     Review of Systems  Constitutional:  Negative for malaise/fatigue.  Respiratory:  Positive for shortness of breath.   Genitourinary:  Negative for dysuria.  Musculoskeletal:  Positive for back pain.  Neurological:  Negative for weakness and headaches.  Psychiatric/Behavioral:  The patient has insomnia.   All other systems reviewed and are negative.     Objective:   Physical Exam Vitals reviewed.  Constitutional:      General: She is not in acute distress.    Appearance: She is well-developed.  HENT:     Head: Normocephalic and atraumatic.     Right Ear: Tympanic membrane normal.     Left Ear: Tympanic membrane normal.  Eyes:     Pupils: Pupils are equal, round, and reactive to light.  Neck:     Thyroid: No thyromegaly.  Cardiovascular:     Rate and Rhythm: Normal rate and regular rhythm.     Heart sounds: Normal heart sounds. No murmur heard. Pulmonary:     Effort: Pulmonary effort is normal. No respiratory distress.     Breath sounds: Normal breath sounds. No wheezing.  Abdominal:     General: Bowel sounds are normal. There is no distension.     Palpations: Abdomen is soft.     Tenderness: There is no abdominal tenderness.  Musculoskeletal:        General: No tenderness. Normal range of motion.     Cervical back: Normal range of motion and neck supple.  Skin:    General: Skin is warm and dry.  Neurological:     Mental Status:  She is alert and oriented to person, place, and time.     Cranial Nerves: No cranial nerve deficit.     Deep Tendon Reflexes: Reflexes are normal and symmetric.  Psychiatric:        Behavior: Behavior normal.        Thought Content: Thought content normal.        Judgment: Judgment normal.      BP 117/62    Pulse 84    Temp 97.7 F (36.5 C) (Temporal)    Ht 5\' 5"  (1.651 m)    Wt 160 lb 12.8 oz  (72.9 kg)    BMI 26.76 kg/m      Assessment & Plan:  Grace [name] comes in today with chief complaint of Medical Management of Chronic Issues and Back Pain   Diagnosis and orders addressed:  1. Chronic bilateral low back pain, unspecified whether sciatica present - HYDROcodone-acetaminophen (NORCO) 10-325 MG tablet; Take 1 tablet by mouth every 8 (eight) hours as needed.  Dispense: 90 tablet; Refill: 0 - HYDROcodone-acetaminophen (NORCO) 10-325 MG tablet; Take 1 tablet by mouth every 8 (eight) hours as needed.  Dispense: 90 tablet; Refill: 0 - HYDROcodone-acetaminophen (NORCO) 10-325 MG tablet; Take 1 tablet by mouth every 8 (eight) hours as needed.  Dispense: 90 tablet; Refill: 0  2. Controlled substance agreement signed - HYDROcodone-acetaminophen (NORCO) 10-325 MG tablet; Take 1 tablet by mouth every 8 (eight) hours as needed.  Dispense: 90 tablet; Refill: 0 - HYDROcodone-acetaminophen (NORCO) 10-325 MG tablet; Take 1 tablet by mouth every 8 (eight) hours as needed.  Dispense: 90 tablet; Refill: 0 - HYDROcodone-acetaminophen (NORCO) 10-325 MG tablet; Take 1 tablet by mouth every 8 (eight) hours as needed.  Dispense: 90 tablet; Refill: 0  3. Uncomplicated opioid dependence (HCC) - HYDROcodone-acetaminophen (NORCO) 10-325 MG tablet; Take 1 tablet by mouth every 8 (eight) hours as needed.  Dispense: 90 tablet; Refill: 0 - HYDROcodone-acetaminophen (NORCO) 10-325 MG tablet; Take 1 tablet by mouth every 8 (eight) hours as needed.  Dispense: 90 tablet; Refill: 0 - HYDROcodone-acetaminophen (NORCO) 10-325 MG tablet; Take 1 tablet by mouth every 8 (eight) hours as needed.  Dispense: 90 tablet; Refill: 0  4. Trigeminal neuralgia - HYDROcodone-acetaminophen (NORCO) 10-325 MG tablet; Take 1 tablet by mouth every 8 (eight) hours as needed.  Dispense: 90 tablet; Refill: 0 - HYDROcodone-acetaminophen (NORCO) 10-325 MG tablet; Take 1 tablet by mouth every 8 (eight) hours as needed.  Dispense: 90  tablet; Refill: 0 - HYDROcodone-acetaminophen (NORCO) 10-325 MG tablet; Take 1 tablet by mouth every 8 (eight) hours as needed.  Dispense: 90 tablet; Refill: 0  5. Chronic low back pain, unspecified back pain laterality, unspecified whether sciatica present - HYDROcodone-acetaminophen (NORCO) 10-325 MG tablet; Take 1 tablet by mouth every 8 (eight) hours as needed.  Dispense: 90 tablet; Refill: 0 - HYDROcodone-acetaminophen (NORCO) 10-325 MG tablet; Take 1 tablet by mouth every 8 (eight) hours as needed.  Dispense: 90 tablet; Refill: 0 - HYDROcodone-acetaminophen (NORCO) 10-325 MG tablet; Take 1 tablet by mouth every 8 (eight) hours as needed.  Dispense: 90 tablet; Refill: 0  6. Primary hypertension  7. Pure hypercholesterolemia  8. Insomnia, unspecified type  9. Tobacco user  10. Vitamin D deficiency   Labs pending Patient reviewed in [place] controlled database, no flags noted. Contract and drug screen are up to date.  Health Maintenance reviewed Diet and exercise encouraged  Follow up plan: 3 months    Chalmers Cater, FNP

## 2022-02-02 NOTE — Patient Instructions (Addendum)

## 2022-04-27 ENCOUNTER — Encounter: Payer: Self-pay | Admitting: Family

## 2022-04-27 ENCOUNTER — Ambulatory Visit: Payer: Self-pay | Admitting: Family

## 2022-04-27 ENCOUNTER — Ambulatory Visit: Payer: 59 | Admitting: Family

## 2022-04-27 VITALS — BP 129/73 | HR 92 | Temp 95.7°F | Ht 65.0 in | Wt 159.8 lb

## 2022-04-27 DIAGNOSIS — Z79899 Other long term (current) drug therapy: Secondary | ICD-10-CM | POA: Diagnosis not present

## 2022-04-27 DIAGNOSIS — E7800 Pure hypercholesterolemia, unspecified: Secondary | ICD-10-CM

## 2022-04-27 DIAGNOSIS — Z720 Tobacco use: Secondary | ICD-10-CM

## 2022-04-27 DIAGNOSIS — M545 Low back pain, unspecified: Secondary | ICD-10-CM

## 2022-04-27 DIAGNOSIS — L732 Hidradenitis suppurativa: Secondary | ICD-10-CM

## 2022-04-27 DIAGNOSIS — I10 Essential (primary) hypertension: Secondary | ICD-10-CM | POA: Diagnosis not present

## 2022-04-27 DIAGNOSIS — G500 Trigeminal neuralgia: Secondary | ICD-10-CM

## 2022-04-27 DIAGNOSIS — F1120 Opioid dependence, uncomplicated: Secondary | ICD-10-CM | POA: Diagnosis not present

## 2022-04-27 DIAGNOSIS — G4700 Insomnia, unspecified: Secondary | ICD-10-CM

## 2022-04-27 DIAGNOSIS — E559 Vitamin D deficiency, unspecified: Secondary | ICD-10-CM

## 2022-04-27 DIAGNOSIS — G8929 Other chronic pain: Secondary | ICD-10-CM

## 2022-04-27 MED ORDER — HYDROCODONE-ACETAMINOPHEN 10-325 MG PO TABS: 1.0000 | ORAL_TABLET | Freq: Three times a day (TID) | ORAL | 0 refills | Status: DC | PRN

## 2022-04-27 MED ORDER — AMLODIPINE BESYLATE 5 MG PO TABS: 5.0000 mg | ORAL_TABLET | Freq: Every day | ORAL | 2 refills | Status: DC

## 2022-04-27 MED ORDER — LOSARTAN POTASSIUM 25 MG PO TABS: 25.0000 mg | ORAL_TABLET | Freq: Every day | ORAL | 2 refills | Status: DC

## 2022-04-27 MED ORDER — ROSUVASTATIN CALCIUM 10 MG PO TABS: 10.0000 mg | ORAL_TABLET | Freq: Every day | ORAL | 1 refills | Status: DC

## 2022-04-27 MED ORDER — CLINDAMYCIN HCL 300 MG PO CAPS: 300.0000 mg | ORAL_CAPSULE | Freq: Three times a day (TID) | ORAL | 1 refills | Status: AC

## 2022-04-27 NOTE — Progress Notes (Signed)
? ?Subjective:  ? ? Patient ID: Grace [name], female    DOB: [birth year], 61 y.o.   MRN: [id number] ? ?Chief Complaint  ?Patient presents with  ? Medical Management of Chronic Issues  ? ?Pt presents to the office today for chronic follow and pain medication refill.  She has trigeminal neuralgia that comes and goes that can be a 2-10 out 10. She has hidradenitis and gets abscess groin. She has 5 active that are swollen and tender. No discharge.  ?Hypertension ?This is a chronic problem. The current episode started more than 1 year ago. The problem has been resolved since onset. The problem is controlled. Associated symptoms include malaise/fatigue. Pertinent negatives include no peripheral edema or shortness of breath. Risk factors for coronary artery disease include dyslipidemia, obesity and sedentary lifestyle. The current treatment provides moderate improvement.  ?Nicotine Dependence ?Presents for follow-up visit. Symptoms include insomnia. Her urge triggers include company of smokers. The symptoms have been stable. She smokes 1 pack of cigarettes per day.  ?Insomnia ?Primary symptoms: difficulty falling asleep, frequent awakening, malaise/fatigue.   ?The problem occurs intermittently. The treatment provided moderate relief.  ?Hyperlipidemia ?This is a chronic problem. The current episode started more than 1 year ago. The problem is controlled. Exacerbating diseases include obesity. Pertinent negatives include no shortness of breath. Current antihyperlipidemic treatment includes statins. The current treatment provides moderate improvement of lipids. Risk factors for coronary artery disease include dyslipidemia, hypertension and a sedentary lifestyle.  ?Back Pain ?This is a chronic problem. The current episode started more than 1 year ago. The problem occurs intermittently. The problem has been waxing and waning since onset. The pain is present in the lumbar spine. The quality of the pain is described as aching.  The pain is at a severity of 7/10. The pain is moderate. She has tried bed rest, analgesics and NSAIDs for the symptoms. The treatment provided mild relief.  ? ?Current opioids rx- Norco 10-325 mg ?# meds rx- 90 ?Effectiveness of current meds-Stable  ?Adverse reactions from pain meds-none ?Morphine equivalent- 30 ? ?Pill count performed-No ?Last drug screen - today ?( high risk q75m, moderate risk q66m, low risk yearly ) ?Urine drug screen today- Yes ?Was the NCCSR reviewed- yes  ? If yes were their any concerning findings? - none ? ? ?Pain contract signed on: 04/27/22 ? ? ?Review of Systems  ?Constitutional:  Positive for malaise/fatigue.  ?Respiratory:  Negative for shortness of breath.   ?Musculoskeletal:  Positive for back pain.  ?Psychiatric/Behavioral:  The patient has insomnia.   ?All other systems reviewed and are negative. ? ?   ?Objective:  ? Physical Exam ?Vitals reviewed.  ?Constitutional:   ?   General: She is not in acute distress. ?   Appearance: She is well-developed. She is obese.  ?HENT:  ?   Head: Normocephalic and atraumatic.  ?   Right Ear: Tympanic membrane normal.  ?   Left Ear: Tympanic membrane normal.  ?Eyes:  ?   Pupils: Pupils are equal, round, and reactive to light.  ?Neck:  ?   Thyroid: No thyromegaly.  ?Cardiovascular:  ?   Rate and Rhythm: Normal rate and regular rhythm.  ?   Heart sounds: Normal heart sounds. No murmur heard. ?Pulmonary:  ?   Effort: Pulmonary effort is normal. No respiratory distress.  ?   Breath sounds: Normal breath sounds. No wheezing.  ?Abdominal:  ?   General: Bowel sounds are normal. There is no distension.  ?  Palpations: Abdomen is soft.  ?   Tenderness: There is no abdominal tenderness.  ?Musculoskeletal:     ?   General: No tenderness. Normal range of motion.  ?   Cervical back: Normal range of motion and neck supple.  ?Skin: ?   General: Skin is warm and dry.  ?Neurological:  ?   Mental Status: She is alert and oriented to person, place, and time.  ?    Cranial Nerves: No cranial nerve deficit.  ?   Deep Tendon Reflexes: Reflexes are normal and symmetric.  ?Psychiatric:     ?   Behavior: Behavior normal.     ?   Thought Content: Thought content normal.     ?   Judgment: Judgment normal.  ? ? ? ? ?BP 129/73   Pulse 92   Temp (!) 95.7 ?F (35.4 ?C) (Temporal)   Ht 5\' 5"  (1.651 m)   Wt 159 lb 12.8 oz (72.5 kg)   SpO2 100%   BMI 26.59 kg/m?  ? ?   ?Assessment & Plan:  ? ?Chalmers CaterJeannie W Loveday comes in today with chief complaint of Medical Management of Chronic Issues ? ? ?Diagnosis and orders addressed: ? ?1. Chronic bilateral low back pain, unspecified whether sciatica present ?- ToxASSURE Select 13 (MW), Urine ?- HYDROcodone-acetaminophen (NORCO) 10-325 MG tablet; Take 1 tablet by mouth every 8 (eight) hours as needed.  Dispense: 90 tablet; Refill: 0 ?- HYDROcodone-acetaminophen (NORCO) 10-325 MG tablet; Take 1 tablet by mouth every 8 (eight) hours as needed.  Dispense: 90 tablet; Refill: 0 ?- HYDROcodone-acetaminophen (NORCO) 10-325 MG tablet; Take 1 tablet by mouth every 8 (eight) hours as needed.  Dispense: 90 tablet; Refill: 0 ? ?2. Controlled substance agreement signed ?- ToxASSURE Select 13 (MW), Urine ?- HYDROcodone-acetaminophen (NORCO) 10-325 MG tablet; Take 1 tablet by mouth every 8 (eight) hours as needed.  Dispense: 90 tablet; Refill: 0 ?- HYDROcodone-acetaminophen (NORCO) 10-325 MG tablet; Take 1 tablet by mouth every 8 (eight) hours as needed.  Dispense: 90 tablet; Refill: 0 ?- HYDROcodone-acetaminophen (NORCO) 10-325 MG tablet; Take 1 tablet by mouth every 8 (eight) hours as needed.  Dispense: 90 tablet; Refill: 0 ? ?3. Uncomplicated opioid dependence (HCC) ?- ToxASSURE Select 13 (MW), Urine ?- HYDROcodone-acetaminophen (NORCO) 10-325 MG tablet; Take 1 tablet by mouth every 8 (eight) hours as needed.  Dispense: 90 tablet; Refill: 0 ?- HYDROcodone-acetaminophen (NORCO) 10-325 MG tablet; Take 1 tablet by mouth every 8 (eight) hours as needed.  Dispense:  90 tablet; Refill: 0 ?- HYDROcodone-acetaminophen (NORCO) 10-325 MG tablet; Take 1 tablet by mouth every 8 (eight) hours as needed.  Dispense: 90 tablet; Refill: 0 ? ?4. Trigeminal neuralgia ?- ToxASSURE Select 13 (MW), Urine ?- HYDROcodone-acetaminophen (NORCO) 10-325 MG tablet; Take 1 tablet by mouth every 8 (eight) hours as needed.  Dispense: 90 tablet; Refill: 0 ?- HYDROcodone-acetaminophen (NORCO) 10-325 MG tablet; Take 1 tablet by mouth every 8 (eight) hours as needed.  Dispense: 90 tablet; Refill: 0 ?- HYDROcodone-acetaminophen (NORCO) 10-325 MG tablet; Take 1 tablet by mouth every 8 (eight) hours as needed.  Dispense: 90 tablet; Refill: 0 ? ?5. Chronic low back pain, unspecified back pain laterality, unspecified whether sciatica present ?- ToxASSURE Select 13 (MW), Urine ?- HYDROcodone-acetaminophen (NORCO) 10-325 MG tablet; Take 1 tablet by mouth every 8 (eight) hours as needed.  Dispense: 90 tablet; Refill: 0 ?- HYDROcodone-acetaminophen (NORCO) 10-325 MG tablet; Take 1 tablet by mouth every 8 (eight) hours as needed.  Dispense: 90 tablet; Refill: 0 ?-  HYDROcodone-acetaminophen (NORCO) 10-325 MG tablet; Take 1 tablet by mouth every 8 (eight) hours as needed.  Dispense: 90 tablet; Refill: 0 ? ?6. Primary hypertension ?- amLODipine (NORVASC) 5 MG tablet; Take 1 tablet (5 mg total) by mouth daily.  Dispense: 90 tablet; Refill: 2 ?- losartan (COZAAR) 25 MG tablet; Take 1 tablet (25 mg total) by mouth daily.  Dispense: 90 tablet; Refill: 2 ? ?7. Pure hypercholesterolemia ? ?- rosuvastatin (CRESTOR) 10 MG tablet; Take 1 tablet (10 mg total) by mouth daily.  Dispense: 90 tablet; Refill: 1 ? ?8. Vitamin D deficiency ? ?9. Tobacco user ? ?10. Insomnia, unspecified type ? ?11. Hidradenitis ?- clindamycin (CLEOCIN) 300 MG capsule; Take 1 capsule (300 mg total) by mouth 3 (three) times daily for 10 days.  Dispense: 30 capsule; Refill: 1 ? ? ?Labs pending ?Patient reviewed in [place] controlled database, no flags noted.  Contract and drug screen up dated today.  ?Health Maintenance reviewed ?Diet and exercise encouraged ? ?Follow up plan: ?3 months ? ?Jannifer Rodney, FNP ? ? ?

## 2022-04-27 NOTE — Patient Instructions (Signed)
Hidradenitis Suppurativa Hidradenitis suppurativa is a long-term (chronic) skin disease. It is similar to a severe form of acne, but it affects areas of the body where acne would be unusual, especially areas of the body where skin rubs against skin and becomes moist. These include: Underarms. Groin. Genital area. Buttocks. Upper thighs. Breasts. Hidradenitis suppurativa may start out as small lumps or pimples caused by blocked sweat glands or hair follicles. Pimples may develop into deep sores that break open (rupture) and drain pus. Over time, affected areas of skin may thicken and become scarred. This condition is rare and does not spread from person to person (non-contagious). What are the causes? The exact cause of this condition is not known. It may be related to: Female and female hormones. An overactive disease-fighting system (immune system). The immune system may over-react to blocked hair follicles or sweat glands and cause swelling and pus-filled sores. What increases the risk? You are more likely to develop this condition if you: Are female. Are 11-55 years old. Have a family history of hidradenitis suppurativa. Have a personal history of acne. Are overweight. Smoke. Take the medicine lithium. What are the signs or symptoms? The first symptoms are usually painful bumps in the skin, similar to pimples. The condition may get worse over time (progress), or it may only cause mild symptoms. If the disease progresses, symptoms may include: Skin bumps getting bigger and growing deeper into the skin. Bumps rupturing and draining pus. Itchy, infected skin. Skin getting thicker and scarred. Tunnels under the skin (fistulas) where pus drains from a bump. Pain during daily activities, such as pain during walking if your groin area is affected. Emotional problems, such as stress or depression. This condition may affect your appearance and your ability or willingness to wear certain clothes  or do certain activities. How is this diagnosed? This condition is diagnosed by a health care provider who specializes in skin diseases (dermatologist). You may be diagnosed based on: Your symptoms and medical history. A physical exam. Testing a pus sample for infection. Blood tests. How is this treated? Your treatment will depend on how severe your symptoms are. The same treatment will not work for everybody with this condition. You may need to try several treatments to find what works best for you. Treatment may include: Cleaning and bandaging (dressing) your wounds as needed. Lifestyle changes, such as new skin care routines. Taking medicines, such as: Antibiotics. Acne medicines. Medicines to reduce the activity of the immune system. A diabetes medicine (metformin). Birth control pills, for women. Steroids to reduce swelling and pain. Working with a mental health care provider, if you experience emotional distress due to this condition. If you have severe symptoms that do not get better with medicine, you may need surgery. Surgery may involve: Using a laser to clear the skin and remove hair follicles. Opening and draining deep sores. Removing the areas of skin that are diseased and scarred. Follow these instructions at home: Medicines  Take over-the-counter and prescription medicines only as told by your health care provider. If you were prescribed an antibiotic medicine, take it as told by your health care provider. Do not stop taking the antibiotic even if your condition improves. Skin care If you have open wounds, cover them with a clean dressing as told by your health care provider. Keep wounds clean by washing them gently with soap and water when you bathe. Do not shave the areas where you get hidradenitis suppurativa. Do not wear deodorant. Wear loose-fitting   clothes. Try to avoid getting overheated or sweaty. If you get sweaty or wet, change into clean, dry clothes as soon  as you can. To help relieve pain and itchiness, cover sore areas with a warm, clean washcloth (warm compress) for 5-10 minutes as often as needed. If told by your health care provider, take a bleach bath twice a week: Fill your bathtub halfway with water. Pour in  cup of unscented household bleach. Soak in the tub for 5-10 minutes. Only soak from the neck down. Avoid water on your face and hair. Shower to rinse off the bleach from your skin. General instructions Learn as much as you can about your disease so that you have an active role in your treatment. Work closely with your health care provider to find treatments that work for you. If you are overweight, work with your health care provider to lose weight as recommended. Do not use any products that contain nicotine or tobacco, such as cigarettes and e-cigarettes. If you need help quitting, ask your health care provider. If you struggle with living with this condition, talk with your health care provider or work with a mental health care provider as recommended. Keep all follow-up visits as told by your health care provider. This is important. Where to find more information Hidradenitis Suppurativa Foundation, Inc.: https://www.hs-foundation.org/ American Academy of Dermatology: https://www.aad.org Contact a health care provider if you have: A flare-up of hidradenitis suppurativa. A fever or chills. Trouble controlling your symptoms at home. Trouble doing your daily activities because of your symptoms. Trouble dealing with emotional problems related to your condition. Summary Hidradenitis suppurativa is a long-term (chronic) skin disease. It is similar to a severe form of acne, but it affects areas of the body where acne would be unusual. The first symptoms are usually painful bumps in the skin, similar to pimples. The condition may only cause mild symptoms, or it may get worse over time (progress). If you have open wounds, cover them  with a clean dressing as told by your health care provider. Keep wounds clean by washing them gently with soap and water when you bathe. Besides skin care, treatment may include medicines, laser treatment, and surgery. This information is not intended to replace advice given to you by your health care provider. Make sure you discuss any questions you have with your health care provider. Document Revised: 10/11/2020 Document Reviewed: 10/11/2020 Elsevier Patient Education  2023 Elsevier Inc.  

## 2022-05-03 LAB — TOXASSURE SELECT 13 (MW), URINE

## 2022-07-27 ENCOUNTER — Ambulatory Visit (INDEPENDENT_AMBULATORY_CARE_PROVIDER_SITE_OTHER): Payer: 59 | Admitting: Family

## 2022-07-27 ENCOUNTER — Encounter: Payer: Self-pay | Admitting: Family

## 2022-07-27 VITALS — BP 130/67 | HR 85 | Temp 97.0°F | Ht 65.0 in | Wt 158.6 lb

## 2022-07-27 DIAGNOSIS — G500 Trigeminal neuralgia: Secondary | ICD-10-CM

## 2022-07-27 DIAGNOSIS — G8929 Other chronic pain: Secondary | ICD-10-CM

## 2022-07-27 DIAGNOSIS — Z720 Tobacco use: Secondary | ICD-10-CM

## 2022-07-27 DIAGNOSIS — M545 Low back pain, unspecified: Secondary | ICD-10-CM | POA: Diagnosis not present

## 2022-07-27 DIAGNOSIS — E7800 Pure hypercholesterolemia, unspecified: Secondary | ICD-10-CM

## 2022-07-27 DIAGNOSIS — F1120 Opioid dependence, uncomplicated: Secondary | ICD-10-CM | POA: Diagnosis not present

## 2022-07-27 DIAGNOSIS — I10 Essential (primary) hypertension: Secondary | ICD-10-CM

## 2022-07-27 DIAGNOSIS — G4700 Insomnia, unspecified: Secondary | ICD-10-CM

## 2022-07-27 DIAGNOSIS — Z79899 Other long term (current) drug therapy: Secondary | ICD-10-CM

## 2022-07-27 MED ORDER — HYDROCODONE-ACETAMINOPHEN 10-325 MG PO TABS
1.0000 | ORAL_TABLET | Freq: Three times a day (TID) | ORAL | 0 refills | Status: DC | PRN
Start: 2022-07-27 — End: 2022-07-27

## 2022-07-27 MED ORDER — HYDROCODONE-ACETAMINOPHEN 10-325 MG PO TABS: 1.0000 | ORAL_TABLET | Freq: Three times a day (TID) | ORAL | 0 refills | Status: DC | PRN

## 2022-07-27 MED ORDER — HYDROCODONE-ACETAMINOPHEN 10-325 MG PO TABS: 1.0000 | ORAL_TABLET | Freq: Four times a day (QID) | ORAL | 0 refills | Status: DC | PRN

## 2022-07-27 MED ORDER — TRAZODONE HCL 150 MG PO TABS: 150.0000 mg | ORAL_TABLET | Freq: Every day | ORAL | 6 refills | Status: DC

## 2022-07-27 NOTE — Progress Notes (Signed)
Subjective:    Patient ID: Grace [name], female    DOB: 10-17-1961, 61 y.o.   MRN: [id number]  Chief Complaint  Patient presents with   Medical Management of Chronic Issues   Pt presents to the office today for chronic follow and pain medication refill.  She has trigeminal neuralgia that comes and goes that can be a 2-10 out 10. She has hidradenitis and gets abscess groin. She has 5 active that are swollen and tender. No discharge.  Hypertension This is a chronic problem. The current episode started more than 1 year ago. The problem has been resolved since onset. The problem is controlled. Associated symptoms include malaise/fatigue and shortness of breath ("if I over do it"). Pertinent negatives include no peripheral edema. Risk factors for coronary artery disease include dyslipidemia, obesity and sedentary lifestyle. The current treatment provides moderate improvement.  Insomnia Primary symptoms: difficulty falling asleep, frequent awakening, malaise/fatigue.   The current episode started more than one year. The onset quality is gradual. The problem occurs intermittently. Past treatments include medication. The treatment provided moderate relief.  Hyperlipidemia This is a chronic problem. The current episode started more than 1 year ago. The problem is controlled. Recent lipid tests were reviewed and are normal. Associated symptoms include shortness of breath ("if I over do it"). Current antihyperlipidemic treatment includes statins. The current treatment provides moderate improvement of lipids. Risk factors for coronary artery disease include dyslipidemia, hypertension, a sedentary lifestyle and post-menopausal.  Back Pain This is a chronic problem. The current episode started more than 1 year ago. The problem occurs intermittently. The problem has been waxing and waning since onset. The pain is present in the lumbar spine. The quality of the pain is described as aching. The pain is at a  severity of 9/10. The pain is moderate. The symptoms are aggravated by standing and twisting. She has tried analgesics and muscle relaxant for the symptoms. The treatment provided mild relief.  Nicotine Dependence Presents for follow-up visit. Symptoms include insomnia. Her urge triggers include company of smokers. The symptoms have been stable. She smokes 1 pack of cigarettes per day.   Current opioids rx- Norco 10-325 mg # meds rx- 90 Effectiveness of current meds-Stable  Adverse reactions from pain meds-none Morphine equivalent- 30   Pill count performed-No Last drug screen - today ( high risk q68m, moderate risk q50m, low risk yearly ) Urine drug screen today- Yes Was the [facility] reviewed- yes              If yes were their any concerning findings? - none     Pain contract signed on: 04/27/22   Review of Systems  Constitutional:  Positive for malaise/fatigue.  Respiratory:  Positive for shortness of breath ("if I over do it").   Musculoskeletal:  Positive for back pain.  Psychiatric/Behavioral:  The patient has insomnia.   All other systems reviewed and are negative.      Objective:   Physical Exam Vitals reviewed.  Constitutional:      General: She is not in acute distress.    Appearance: She is well-developed.  HENT:     Head: Normocephalic and atraumatic.     Right Ear: External ear normal.  Eyes:     Pupils: Pupils are equal, round, and reactive to light.  Neck:     Thyroid: No thyromegaly.  Cardiovascular:     Rate and Rhythm: Normal rate and regular rhythm.     Heart sounds: Normal heart sounds.  No murmur heard. Pulmonary:     Effort: Pulmonary effort is normal. No respiratory distress.     Breath sounds: Normal breath sounds. No wheezing.  Abdominal:     General: Bowel sounds are normal. There is no distension.     Palpations: Abdomen is soft.     Tenderness: There is no abdominal tenderness.  Musculoskeletal:        General: No tenderness. Normal range of  motion.     Cervical back: Normal range of motion and neck supple.  Skin:    General: Skin is warm and dry.  Neurological:     Mental Status: She is alert and oriented to person, place, and time.     Cranial Nerves: No cranial nerve deficit.     Deep Tendon Reflexes: Reflexes are normal and symmetric.  Psychiatric:        Behavior: Behavior normal.        Thought Content: Thought content normal.        Judgment: Judgment normal.       BP 130/67   Pulse 85   Temp (!) 97 F (36.1 C) (Temporal)   Ht 5\' 5"  (1.651 m)   Wt 158 lb 9.6 oz (71.9 kg)   SpO2 100%   BMI 26.39 kg/m      Assessment & Plan:  Grace [name] comes in today with chief complaint of Medical Management of Chronic Issues   Diagnosis and orders addressed:  1. Chronic bilateral low back pain, unspecified whether sciatica present - HYDROcodone-acetaminophen (NORCO) 10-325 MG tablet; Take 1 tablet by mouth every 8 (eight) hours as needed.  Dispense: 90 tablet; Refill: 0 - HYDROcodone-acetaminophen (NORCO) 10-325 MG tablet; Take 1 tablet by mouth every 8 (eight) hours as needed.  Dispense: 90 tablet; Refill: 0 - HYDROcodone-acetaminophen (NORCO) 10-325 MG tablet; Take 1 tablet by mouth every 8 (eight) hours as needed.  Dispense: 90 tablet; Refill: 0  2. Controlled substance agreement signed - HYDROcodone-acetaminophen (NORCO) 10-325 MG tablet; Take 1 tablet by mouth every 8 (eight) hours as needed.  Dispense: 90 tablet; Refill: 0 - HYDROcodone-acetaminophen (NORCO) 10-325 MG tablet; Take 1 tablet by mouth every 8 (eight) hours as needed.  Dispense: 90 tablet; Refill: 0 - HYDROcodone-acetaminophen (NORCO) 10-325 MG tablet; Take 1 tablet by mouth every 8 (eight) hours as needed.  Dispense: 90 tablet; Refill: 0  3. Uncomplicated opioid dependence (HCC) - HYDROcodone-acetaminophen (NORCO) 10-325 MG tablet; Take 1 tablet by mouth every 8 (eight) hours as needed.  Dispense: 90 tablet; Refill: 0 -  HYDROcodone-acetaminophen (NORCO) 10-325 MG tablet; Take 1 tablet by mouth every 8 (eight) hours as needed.  Dispense: 90 tablet; Refill: 0 - HYDROcodone-acetaminophen (NORCO) 10-325 MG tablet; Take 1 tablet by mouth every 8 (eight) hours as needed.  Dispense: 90 tablet; Refill: 0  4. Trigeminal neuralgia - HYDROcodone-acetaminophen (NORCO) 10-325 MG tablet; Take 1 tablet by mouth every 8 (eight) hours as needed.  Dispense: 90 tablet; Refill: 0 - HYDROcodone-acetaminophen (NORCO) 10-325 MG tablet; Take 1 tablet by mouth every 8 (eight) hours as needed.  Dispense: 90 tablet; Refill: 0 - HYDROcodone-acetaminophen (NORCO) 10-325 MG tablet; Take 1 tablet by mouth every 8 (eight) hours as needed.  Dispense: 90 tablet; Refill: 0  5. Chronic low back pain, unspecified back pain laterality, unspecified whether sciatica present - HYDROcodone-acetaminophen (NORCO) 10-325 MG tablet; Take 1 tablet by mouth every 8 (eight) hours as needed.  Dispense: 90 tablet; Refill: 0 - HYDROcodone-acetaminophen (NORCO) 10-325 MG tablet; Take 1  tablet by mouth every 8 (eight) hours as needed.  Dispense: 90 tablet; Refill: 0 - HYDROcodone-acetaminophen (NORCO) 10-325 MG tablet; Take 1 tablet by mouth every 8 (eight) hours as needed.  Dispense: 90 tablet; Refill: 0  6. Insomnia, unspecified type - traZODone (DESYREL) 150 MG tablet; Take 1 tablet (150 mg total) by mouth at bedtime.  Dispense: 90 tablet; Refill: 6  7. Primary hypertension  8. Pure hypercholesterolemia  9. Tobacco user   Patient reviewed in [place] controlled database, no flags noted. Contract and drug screen are up to date.  Health Maintenance reviewed Diet and exercise encouraged  Follow up plan: 3 months    Jannifer Rodney, FNP

## 2022-07-27 NOTE — Patient Instructions (Signed)

## 2022-09-27 ENCOUNTER — Other Ambulatory Visit (HOSPITAL_COMMUNITY): Payer: Self-pay | Admitting: Preventative Medicine

## 2022-09-27 ENCOUNTER — Other Ambulatory Visit: Payer: Self-pay | Admitting: Preventative Medicine

## 2022-09-27 DIAGNOSIS — M79641 Pain in right hand: Secondary | ICD-10-CM

## 2022-10-02 ENCOUNTER — Encounter (HOSPITAL_COMMUNITY)
Admission: RE | Admit: 2022-10-02 | Discharge: 2022-10-02 | Disposition: A | Discharge status: Home / Self Care | Payer: Worker's Compensation | Source: Ambulatory Visit | Attending: Preventative Medicine | Admitting: Preventative Medicine

## 2022-10-02 ENCOUNTER — Ambulatory Visit (HOSPITAL_COMMUNITY)
Admission: RE | Admit: 2022-10-02 | Discharge: 2022-10-02 | Disposition: A | Discharge status: Home / Self Care | Payer: Worker's Compensation | Source: Ambulatory Visit | Attending: Preventative Medicine | Admitting: Preventative Medicine

## 2022-10-02 ENCOUNTER — Encounter (HOSPITAL_COMMUNITY): Payer: Self-pay

## 2022-10-02 ENCOUNTER — Encounter: Payer: Self-pay | Admitting: *Deleted

## 2022-10-02 DIAGNOSIS — M79641 Pain in right hand: Secondary | ICD-10-CM | POA: Diagnosis present

## 2022-10-02 MED ORDER — TECHNETIUM TC 99M MEDRONATE IV KIT
20.0000 | PACK | Freq: Once | INTRAVENOUS | Status: AC | PRN
  Administered 2022-10-02: 21 via INTRAVENOUS

## 2022-10-05 ENCOUNTER — Other Ambulatory Visit (HOSPITAL_COMMUNITY): Payer: 59

## 2022-10-19 ENCOUNTER — Ambulatory Visit: Payer: 59 | Admitting: Family

## 2022-10-22 ENCOUNTER — Ambulatory Visit (INDEPENDENT_AMBULATORY_CARE_PROVIDER_SITE_OTHER): Payer: 59 | Admitting: Family

## 2022-10-22 ENCOUNTER — Telehealth: Payer: Self-pay | Admitting: Family

## 2022-10-22 ENCOUNTER — Encounter: Payer: Self-pay | Admitting: Family

## 2022-10-22 VITALS — BP 126/60 | HR 76 | Temp 97.4°F | Ht 65.0 in | Wt 157.0 lb

## 2022-10-22 DIAGNOSIS — M5430 Sciatica, unspecified side: Secondary | ICD-10-CM

## 2022-10-22 DIAGNOSIS — L732 Hidradenitis suppurativa: Secondary | ICD-10-CM

## 2022-10-22 DIAGNOSIS — Z23 Encounter for immunization: Secondary | ICD-10-CM | POA: Diagnosis not present

## 2022-10-22 DIAGNOSIS — M545 Low back pain, unspecified: Secondary | ICD-10-CM | POA: Diagnosis not present

## 2022-10-22 DIAGNOSIS — Z79899 Other long term (current) drug therapy: Secondary | ICD-10-CM

## 2022-10-22 DIAGNOSIS — G8929 Other chronic pain: Secondary | ICD-10-CM

## 2022-10-22 DIAGNOSIS — Z0000 Encounter for general adult medical examination without abnormal findings: Secondary | ICD-10-CM

## 2022-10-22 DIAGNOSIS — Z0001 Encounter for general adult medical examination with abnormal findings: Secondary | ICD-10-CM

## 2022-10-22 DIAGNOSIS — G500 Trigeminal neuralgia: Secondary | ICD-10-CM

## 2022-10-22 DIAGNOSIS — I10 Essential (primary) hypertension: Secondary | ICD-10-CM

## 2022-10-22 DIAGNOSIS — G4700 Insomnia, unspecified: Secondary | ICD-10-CM

## 2022-10-22 DIAGNOSIS — Z720 Tobacco use: Secondary | ICD-10-CM

## 2022-10-22 DIAGNOSIS — F1120 Opioid dependence, uncomplicated: Secondary | ICD-10-CM | POA: Diagnosis not present

## 2022-10-22 DIAGNOSIS — E7800 Pure hypercholesterolemia, unspecified: Secondary | ICD-10-CM

## 2022-10-22 DIAGNOSIS — E559 Vitamin D deficiency, unspecified: Secondary | ICD-10-CM

## 2022-10-22 MED ORDER — HYDROCODONE-ACETAMINOPHEN 10-325 MG PO TABS: 1.0000 | ORAL_TABLET | Freq: Three times a day (TID) | ORAL | 0 refills | Status: DC | PRN

## 2022-10-22 MED ORDER — LOSARTAN POTASSIUM 25 MG PO TABS: 25.0000 mg | ORAL_TABLET | Freq: Every day | ORAL | 2 refills | Status: DC

## 2022-10-22 MED ORDER — GABAPENTIN 100 MG PO CAPS: 100.0000 mg | ORAL_CAPSULE | Freq: Three times a day (TID) | ORAL | 3 refills | Status: DC

## 2022-10-22 MED ORDER — HYDROCODONE-ACETAMINOPHEN 10-325 MG PO TABS
1.0000 | ORAL_TABLET | Freq: Four times a day (QID) | ORAL | 0 refills | Status: DC | PRN
Start: 2022-12-17 — End: 2022-10-22

## 2022-10-22 MED ORDER — AMLODIPINE BESYLATE 5 MG PO TABS: 5.0000 mg | ORAL_TABLET | Freq: Every day | ORAL | 2 refills | Status: DC

## 2022-10-22 MED ORDER — TRAZODONE HCL 150 MG PO TABS: 150.0000 mg | ORAL_TABLET | Freq: Every day | ORAL | 6 refills | Status: DC

## 2022-10-22 MED ORDER — CEPHALEXIN 500 MG PO CAPS: 500.0000 mg | ORAL_CAPSULE | Freq: Three times a day (TID) | ORAL | 1 refills | Status: DC

## 2022-10-22 MED ORDER — ROSUVASTATIN CALCIUM 10 MG PO TABS: 10.0000 mg | ORAL_TABLET | Freq: Every day | ORAL | 1 refills | Status: DC

## 2022-10-22 NOTE — Telephone Encounter (Signed)
Switched to every 8 hours

## 2022-10-22 NOTE — Telephone Encounter (Signed)
Rx for HYDROcodone-acetaminophen [facility]) 10-325 MG tablet sent for Oct and Nov are for Every 8hrs but Dec says every 6hrs. Was Dec 6hrs sent error? Please call back

## 2022-10-22 NOTE — Patient Instructions (Signed)
Hidradenitis Suppurativa Hidradenitis suppurativa is a long-term (chronic) skin disease. It is similar to a severe form of acne, but it affects areas of the body where acne would be unusual, especially areas of the body where skin rubs against skin and becomes moist. These include: Underarms. Groin. Genital area. Buttocks. Upper thighs. Breasts. Hidradenitis suppurativa may start out as small lumps or pimples caused by blocked skin pores, sweat glands, or hair follicles. Pimples may develop into deep sores that break open (rupture) and drain pus. Over time, affected areas of skin may thicken and become scarred. This condition is rare and does not spread from person to person (non-contagious). What are the causes? The exact cause of this condition is not known. It may be related to: Female and female hormones. An overactive disease-fighting system (immune system). The immune system may over-react to blocked hair follicles or sweat glands and cause swelling and pus-filled sores. What increases the risk? You are more likely to develop this condition if you: Are female. Are 11-55 years old. Have a family history of hidradenitis suppurativa. Have a personal history of acne. Are overweight. Smoke. Take the medicine lithium. What are the signs or symptoms? The first symptoms are usually painful bumps in the skin, similar to pimples. The condition may get worse over time (progress), or it may only cause mild symptoms. If the disease progresses, symptoms may include: Skin bumps getting bigger and growing deeper into the skin. Bumps rupturing and draining pus. Itchy, infected skin. Skin getting thicker and scarred. Tunnels under the skin (fistulas) where pus drains from a bump. Pain during daily activities, such as pain during walking if your groin area is affected. Emotional problems, such as stress or depression. This condition may affect your appearance and your ability or willingness to wear  certain clothes or do certain activities. How is this diagnosed? This condition is diagnosed by a health care provider who specializes in skin conditions (dermatologist). You may be diagnosed based on: Your symptoms and medical history. A physical exam. Testing a pus sample for infection. Blood tests. How is this treated? Your treatment will depend on how severe your symptoms are. The same treatment will not work for everybody with this condition. You may need to try several treatments to find what works best for you. Treatment may include: Cleaning and bandaging (dressing) your wounds as needed. Lifestyle changes, such as new skin care routines. Taking medicines, such as: Antibiotics. Acne medicines. Medicines to reduce the activity of the immune system. A diabetes medicine (metformin). Birth control pills, for women. Steroids to reduce swelling and pain. Working with a mental health care provider, if you experience emotional distress due to this condition. If you have severe symptoms that do not get better with medicine, you may need surgery. Surgery may involve: Using a laser to clear the skin and remove hair follicles. Opening and draining deep sores. Removing the areas of skin that are diseased and scarred. Follow these instructions at home: Medicines  Take over-the-counter and prescription medicines only as told by your health care provider. If you were prescribed antibiotics, take them as told by your health care provider. Do not stop using the antibiotic even if your condition improves. Skin care If you have open wounds, cover them with a clean dressing as told by your health care provider. Keep wounds clean by washing them gently with soap and water when you bathe. Do not shave the areas where you get hidradenitis suppurativa. Wear loose-fitting clothes. Try to avoid   getting overheated or sweaty. If you get sweaty or wet, change into clean, dry clothes as soon as you can. To  help relieve pain and itchiness, cover sore areas with a warm, clean washcloth (warm compress) for 5-10 minutes as often as needed. Your healthcare provider may recommend an antiperspirant deodorant that may be gentle on your skin. A daily antiseptic wash to cleanse affected areas may be suggested by your healthcare provider. General instructions Learn as much as you can about your disease so that you have an active role in your treatment. Work closely with your health care provider to find treatments that work for you. If you are overweight, work with your health care provider to lose weight as recommended. Do not use any products that contain nicotine or tobacco. These products include cigarettes, chewing tobacco, and vaping devices, such as e-cigarettes. If you need help quitting, ask your health care provider. If you struggle with living with this condition, talk with your health care provider or work with a mental health care provider as recommended. Keep all follow-up visits. Where to find more information Hidradenitis Suppurativa Foundation, Inc.: www.hs-foundation.org American Academy of Dermatology: www.aad.org Contact a health care provider if: You have a flare-up of hidradenitis suppurativa. You have a fever or chills. You have trouble controlling your symptoms at home. You have trouble doing your daily activities because of your symptoms. You have trouble dealing with emotional problems related to your condition. Summary Hidradenitis suppurativa is a long-term (chronic) skin disease. It is similar to a severe form of acne, but it affects areas of the body where acne would be unusual. The first symptoms are usually painful bumps in the skin, similar to pimples. The condition may only cause mild symptoms, or it may get worse over time (progress). If you have open wounds, cover them with a clean dressing as told by your health care provider. Keep wounds clean by washing them gently with  soap and water when you bathe. Besides skin care, treatment may include medicines, laser treatment, and surgery. This information is not intended to replace advice given to you by your health care provider. Make sure you discuss any questions you have with your health care provider. Document Revised: 02/07/2022 Document Reviewed: 02/07/2022 Elsevier Patient Education  2023 Elsevier Inc.  

## 2022-10-22 NOTE — Progress Notes (Signed)
Subjective:    Patient ID: Grace [name], female    DOB: December 26, 1961, 61 y.o.   MRN: [id number]  Chief Complaint  Patient presents with   Medical Management of Chronic Issues    Flu shot today    Pt presents to the office today for CPE and chronic follow and pain medication refill.  She has trigeminal neuralgia that comes and goes that can be a 2-10 out 10. She has hidradenitis and gets abscess groin. She has 5 active that are swollen and tender. No discharge.   She fell on 09/10/22 at work and is followed by Ortho and has a MRI pending.  Hypertension This is a chronic problem. The current episode started more than 1 year ago. The problem has been resolved since onset. The problem is controlled. Pertinent negatives include no malaise/fatigue, peripheral edema or shortness of breath. Risk factors for coronary artery disease include dyslipidemia and smoking/tobacco exposure. The current treatment provides moderate improvement. There is no history of heart failure.  Nicotine Dependence Presents for follow-up visit. Symptoms include insomnia. Her urge triggers include company of smokers. The symptoms have been stable. She smokes 1 pack of cigarettes per day.  Hyperlipidemia This is a chronic problem. The current episode started more than 1 year ago. The problem is controlled. Exacerbating diseases include obesity. Pertinent negatives include no shortness of breath. Current antihyperlipidemic treatment includes statins. The current treatment provides moderate improvement of lipids. Risk factors for coronary artery disease include dyslipidemia and a sedentary lifestyle.  Back Pain This is a chronic problem. The current episode started more than 1 year ago. The problem occurs intermittently. The pain is at a severity of 7/10. The pain is moderate. She has tried analgesics for the symptoms. The treatment provided moderate relief.  Insomnia Primary symptoms: difficulty falling asleep, frequent  awakening, no malaise/fatigue.   The current episode started more than one year. The problem occurs intermittently.      Review of Systems  Constitutional:  Negative for malaise/fatigue.  Respiratory:  Negative for shortness of breath.   Musculoskeletal:  Positive for back pain.  Psychiatric/Behavioral:  The patient has insomnia.   All other systems reviewed and are negative.  Family History  Problem Relation Age of Onset   Hypertension Mother    Dementia Mother    Alcohol abuse Mother    Cancer Father        lung   Breast cancer Half-Sister    Social History   Socioeconomic History   Marital status: Divorced    Spouse name: Not on file   Number of children: Not on file   Years of education: Not on file   Highest education level: Not on file  Occupational History   Not on file  Tobacco Use   Smoking status: Every Day    Packs/day: 1.00    Types: Cigarettes   Smokeless tobacco: Never  Vaping Use   Vaping Use: Never used  Substance and Sexual Activity   Alcohol use: No   Drug use: No   Sexual activity: Not on file  Other Topics Concern   Not on file  Social History Narrative   Not on file   Social Determinants of Health   Financial Resource Strain: Not on file  Food Insecurity: Not on file  Transportation Needs: Not on file  Physical Activity: Not on file  Stress: Not on file  Social Connections: Not on file        Objective:   Physical  Exam Vitals reviewed.  Constitutional:      General: She is not in acute distress.    Appearance: She is well-developed. She is obese.  HENT:     Head: Normocephalic and atraumatic.     Right Ear: Tympanic membrane normal.     Left Ear: Tympanic membrane normal.  Eyes:     Pupils: Pupils are equal, round, and reactive to light.  Neck:     Thyroid: No thyromegaly.  Cardiovascular:     Rate and Rhythm: Normal rate and regular rhythm.     Heart sounds: Normal heart sounds. No murmur heard. Pulmonary:     Effort:  Pulmonary effort is normal. No respiratory distress.     Breath sounds: Normal breath sounds. No wheezing.  Abdominal:     General: Bowel sounds are normal. There is no distension.     Palpations: Abdomen is soft.     Tenderness: There is no abdominal tenderness.  Musculoskeletal:        General: Tenderness (pain in right thumb and wrist) present. Normal range of motion.     Cervical back: Normal range of motion and neck supple.  Skin:    General: Skin is warm and dry.  Neurological:     Mental Status: She is alert and oriented to person, place, and time.     Cranial Nerves: No cranial nerve deficit.     Deep Tendon Reflexes: Reflexes are normal and symmetric.  Psychiatric:        Behavior: Behavior normal.        Thought Content: Thought content normal.        Judgment: Judgment normal.       BP 126/60   Pulse 76   Temp (!) 97.4 F (36.3 C) (Temporal)   Ht 5' 5" (1.651 m)   Wt 157 lb (71.2 kg)   SpO2 100%   BMI 26.13 kg/m      Assessment & Plan:  OVETA IDRIS comes in today with chief complaint of Medical Management of Chronic Issues (Flu shot today )   Diagnosis and orders addressed:  1. Chronic bilateral low back pain, unspecified whether sciatica present - HYDROcodone-acetaminophen (NORCO) 10-325 MG tablet; Take 1 tablet by mouth every 8 (eight) hours as needed.  Dispense: 90 tablet; Refill: 0 - HYDROcodone-acetaminophen (NORCO) 10-325 MG tablet; Take 1 tablet by mouth every 6 (six) hours as needed.  Dispense: 110 tablet; Refill: 0 - HYDROcodone-acetaminophen (NORCO) 10-325 MG tablet; Take 1 tablet by mouth every 8 (eight) hours as needed.  Dispense: 90 tablet; Refill: 0 - CMP14+EGFR - CBC with Differential/Platelet  2. Controlled substance agreement signed - HYDROcodone-acetaminophen (NORCO) 10-325 MG tablet; Take 1 tablet by mouth every 8 (eight) hours as needed.  Dispense: 90 tablet; Refill: 0 - HYDROcodone-acetaminophen (NORCO) 10-325 MG tablet; Take 1  tablet by mouth every 6 (six) hours as needed.  Dispense: 110 tablet; Refill: 0 - HYDROcodone-acetaminophen (NORCO) 10-325 MG tablet; Take 1 tablet by mouth every 8 (eight) hours as needed.  Dispense: 90 tablet; Refill: 0 - CMP14+EGFR - CBC with Differential/Platelet  3. Uncomplicated opioid dependence (HCC) - HYDROcodone-acetaminophen (NORCO) 10-325 MG tablet; Take 1 tablet by mouth every 8 (eight) hours as needed.  Dispense: 90 tablet; Refill: 0 - HYDROcodone-acetaminophen (NORCO) 10-325 MG tablet; Take 1 tablet by mouth every 6 (six) hours as needed.  Dispense: 110 tablet; Refill: 0 - HYDROcodone-acetaminophen (NORCO) 10-325 MG tablet; Take 1 tablet by mouth every 8 (eight) hours as needed.  Dispense:  90 tablet; Refill: 0 - CMP14+EGFR - CBC with Differential/Platelet  4. Trigeminal neuralgia  - HYDROcodone-acetaminophen (NORCO) 10-325 MG tablet; Take 1 tablet by mouth every 8 (eight) hours as needed.  Dispense: 90 tablet; Refill: 0 - HYDROcodone-acetaminophen (NORCO) 10-325 MG tablet; Take 1 tablet by mouth every 6 (six) hours as needed.  Dispense: 110 tablet; Refill: 0 - HYDROcodone-acetaminophen (NORCO) 10-325 MG tablet; Take 1 tablet by mouth every 8 (eight) hours as needed.  Dispense: 90 tablet; Refill: 0 - CMP14+EGFR - CBC with Differential/Platelet  5. Chronic low back pain, unspecified back pain laterality, unspecified whether sciatica present - HYDROcodone-acetaminophen (NORCO) 10-325 MG tablet; Take 1 tablet by mouth every 8 (eight) hours as needed.  Dispense: 90 tablet; Refill: 0 - HYDROcodone-acetaminophen (NORCO) 10-325 MG tablet; Take 1 tablet by mouth every 6 (six) hours as needed.  Dispense: 110 tablet; Refill: 0 - HYDROcodone-acetaminophen (NORCO) 10-325 MG tablet; Take 1 tablet by mouth every 8 (eight) hours as needed.  Dispense: 90 tablet; Refill: 0 - CMP14+EGFR - CBC with Differential/Platelet  6. Insomnia, unspecified type  - traZODone (DESYREL) 150 MG tablet; Take  1 tablet (150 mg total) by mouth at bedtime.  Dispense: 90 tablet; Refill: 6 - CMP14+EGFR - CBC with Differential/Platelet  7. Primary hypertension  - losartan (COZAAR) 25 MG tablet; Take 1 tablet (25 mg total) by mouth daily.  Dispense: 90 tablet; Refill: 2 - amLODipine (NORVASC) 5 MG tablet; Take 1 tablet (5 mg total) by mouth daily.  Dispense: 90 tablet; Refill: 2 - CMP14+EGFR - CBC with Differential/Platelet  8. Pure hypercholesterolemia  - rosuvastatin (CRESTOR) 10 MG tablet; Take 1 tablet (10 mg total) by mouth daily.  Dispense: 90 tablet; Refill: 1 - CMP14+EGFR - CBC with Differential/Platelet  9. Sciatic leg pain - gabapentin (NEURONTIN) 100 MG capsule; Take 1 capsule (100 mg total) by mouth 3 (three) times daily.  Dispense: 90 capsule; Refill: 3 - CMP14+EGFR - CBC with Differential/Platelet  10. Need for immunization against influenza  - Flu Vaccine QUAD 16moIM (Fluarix, Fluzone & Alfiuria Quad PF) - CMP14+EGFR - CBC with Differential/Platelet  11. Hidradenitis - CMP14+EGFR - CBC with Differential/Platelet - cephALEXin (KEFLEX) 500 MG capsule; Take 1 capsule (500 mg total) by mouth 3 (three) times daily.  Dispense: 21 capsule; Refill: 1  12. Vitamin D deficiency - CMP14+EGFR - CBC with Differential/Platelet - VITAMIN D 25 Hydroxy (Vit-D Deficiency, Fractures)  13. Tobacco user - CMP14+EGFR - CBC with Differential/Platelet  14. Annual physical exam - CMP14+EGFR - CBC with Differential/Platelet - Lipid panel - TSH - VITAMIN D 25 Hydroxy (Vit-D Deficiency, Fractures)   Labs pending Health Maintenance reviewed Diet and exercise encouraged  Follow up plan: 3 months   CEvelina Dun FNP

## 2022-10-23 ENCOUNTER — Other Ambulatory Visit: Payer: Self-pay | Admitting: Family

## 2022-10-23 LAB — CBC WITH DIFFERENTIAL/PLATELET
Basophils Absolute: 0 10*3/uL (ref 0.0–0.2)
Basos: 1 %
EOS (ABSOLUTE): 0.2 10*3/uL (ref 0.0–0.4)
Eos: 2 %
Hematocrit: 39.8 % (ref 34.0–46.6)
Hemoglobin: 13.4 g/dL (ref 11.1–15.9)
Immature Grans (Abs): 0 10*3/uL (ref 0.0–0.1)
Immature Granulocytes: 0 %
Lymphocytes Absolute: 2.1 10*3/uL (ref 0.7–3.1)
Lymphs: 30 %
MCH: 30.5 pg (ref 26.6–33.0)
MCHC: 33.7 g/dL (ref 31.5–35.7)
MCV: 91 fL (ref 79–97)
Monocytes Absolute: 0.5 10*3/uL (ref 0.1–0.9)
Monocytes: 7 %
Neutrophils Absolute: 4.2 10*3/uL (ref 1.4–7.0)
Neutrophils: 60 %
Platelets: 268 10*3/uL (ref 150–450)
RBC: 4.4 x10E6/uL (ref 3.77–5.28)
RDW: 12 % (ref 11.7–15.4)
WBC: 7 10*3/uL (ref 3.4–10.8)

## 2022-10-23 LAB — CMP14+EGFR
ALT: 8 IU/L (ref 0–32)
AST: 12 IU/L (ref 0–40)
Albumin/Globulin Ratio: 1.6 (ref 1.2–2.2)
Albumin: 4.1 g/dL (ref 3.9–4.9)
Alkaline Phosphatase: 58 IU/L (ref 44–121)
BUN/Creatinine Ratio: 8 — ABNORMAL LOW (ref 12–28)
BUN: 7 mg/dL — ABNORMAL LOW (ref 8–27)
Bilirubin Total: 0.3 mg/dL (ref 0.0–1.2)
CO2: 22 mmol/L (ref 20–29)
Calcium: 9.3 mg/dL (ref 8.7–10.3)
Chloride: 103 mmol/L (ref 96–106)
Creatinine, Ser: 0.87 mg/dL (ref 0.57–1.00)
Globulin, Total: 2.6 g/dL (ref 1.5–4.5)
Glucose: 86 mg/dL (ref 70–99)
Potassium: 4.4 mmol/L (ref 3.5–5.2)
Sodium: 138 mmol/L (ref 134–144)
Total Protein: 6.7 g/dL (ref 6.0–8.5)
eGFR: 75 mL/min/{1.73_m2} (ref >59)

## 2022-10-23 LAB — LIPID PANEL
Chol/HDL Ratio: 6.1 ratio — ABNORMAL HIGH (ref 0.0–4.4)
Cholesterol, Total: 225 mg/dL — ABNORMAL HIGH (ref 100–199)
HDL: 37 mg/dL — ABNORMAL LOW (ref >39)
LDL Chol Calc (NIH): 146 mg/dL — ABNORMAL HIGH (ref 0–99)
Triglycerides: 231 mg/dL — ABNORMAL HIGH (ref 0–149)
VLDL Cholesterol Cal: 42 mg/dL — ABNORMAL HIGH (ref 5–40)

## 2022-10-23 LAB — TSH: TSH: 1.07 u[IU]/mL (ref 0.450–4.500)

## 2022-10-23 LAB — VITAMIN D 25 HYDROXY (VIT D DEFICIENCY, FRACTURES): Vit D, 25-Hydroxy: 20.9 ng/mL — ABNORMAL LOW (ref 30.0–100.0)

## 2022-10-23 MED ORDER — VITAMIN D (ERGOCALCIFEROL) 1.25 MG (50000 UNIT) PO CAPS: 50000.0000 [IU] | ORAL_CAPSULE | ORAL | 3 refills | Status: DC

## 2022-10-25 ENCOUNTER — Encounter: Payer: Self-pay | Admitting: Family Medicine

## 2022-11-20 ENCOUNTER — Telehealth: Payer: Self-pay | Admitting: Family

## 2022-11-20 DIAGNOSIS — Z79899 Other long term (current) drug therapy: Secondary | ICD-10-CM

## 2022-11-20 DIAGNOSIS — G500 Trigeminal neuralgia: Secondary | ICD-10-CM

## 2022-11-20 DIAGNOSIS — G8929 Other chronic pain: Secondary | ICD-10-CM

## 2022-11-20 DIAGNOSIS — F1120 Opioid dependence, uncomplicated: Secondary | ICD-10-CM

## 2022-11-20 DIAGNOSIS — M545 Low back pain, unspecified: Secondary | ICD-10-CM

## 2022-11-20 MED ORDER — HYDROCODONE-ACETAMINOPHEN 10-325 MG PO TABS: 1.0000 | ORAL_TABLET | Freq: Three times a day (TID) | ORAL | 0 refills | Status: DC | PRN

## 2022-11-20 NOTE — Telephone Encounter (Signed)
Prescription sent to pharmacy.

## 2022-11-20 NOTE — Telephone Encounter (Signed)
Patient aware and verbalized understanding. °

## 2022-11-21 ENCOUNTER — Telehealth: Payer: Self-pay | Admitting: Family Medicine

## 2022-11-21 NOTE — Telephone Encounter (Signed)
Sent to plan today and outcome was approved    Approvedtoday CaseId:[id number];Status:Approved;Review Type:Prior Auth;Coverage Start Date:11/21/2022;Coverage End Date:11/21/2023;

## 2023-01-17 ENCOUNTER — Encounter: Payer: Self-pay | Admitting: Family

## 2023-01-17 ENCOUNTER — Ambulatory Visit: Payer: BC Managed Care – PPO | Admitting: Family

## 2023-01-17 VITALS — BP 125/67 | HR 81 | Temp 97.7°F | Ht 65.0 in | Wt 155.0 lb

## 2023-01-17 DIAGNOSIS — G500 Trigeminal neuralgia: Secondary | ICD-10-CM | POA: Diagnosis not present

## 2023-01-17 DIAGNOSIS — E7800 Pure hypercholesterolemia, unspecified: Secondary | ICD-10-CM

## 2023-01-17 DIAGNOSIS — E559 Vitamin D deficiency, unspecified: Secondary | ICD-10-CM

## 2023-01-17 DIAGNOSIS — L732 Hidradenitis suppurativa: Secondary | ICD-10-CM

## 2023-01-17 DIAGNOSIS — G8929 Other chronic pain: Secondary | ICD-10-CM

## 2023-01-17 DIAGNOSIS — Z79899 Other long term (current) drug therapy: Secondary | ICD-10-CM

## 2023-01-17 DIAGNOSIS — G4700 Insomnia, unspecified: Secondary | ICD-10-CM

## 2023-01-17 DIAGNOSIS — I10 Essential (primary) hypertension: Secondary | ICD-10-CM

## 2023-01-17 DIAGNOSIS — Z720 Tobacco use: Secondary | ICD-10-CM

## 2023-01-17 DIAGNOSIS — F1120 Opioid dependence, uncomplicated: Secondary | ICD-10-CM | POA: Diagnosis not present

## 2023-01-17 DIAGNOSIS — M545 Low back pain, unspecified: Secondary | ICD-10-CM

## 2023-01-17 MED ORDER — AMLODIPINE BESYLATE 5 MG PO TABS: 5.0000 mg | ORAL_TABLET | Freq: Every day | ORAL | 2 refills | Status: DC

## 2023-01-17 MED ORDER — LOSARTAN POTASSIUM 25 MG PO TABS: 25.0000 mg | ORAL_TABLET | Freq: Every day | ORAL | 2 refills | Status: DC

## 2023-01-17 MED ORDER — HYDROCODONE-ACETAMINOPHEN 10-325 MG PO TABS: 1.0000 | ORAL_TABLET | Freq: Three times a day (TID) | ORAL | 0 refills | Status: DC | PRN

## 2023-01-17 NOTE — Patient Instructions (Signed)

## 2023-01-17 NOTE — Progress Notes (Signed)
Subjective:    Patient ID: Grace [name], female    DOB: [birth year], 62 y.o.   MRN: [id number]  Chief Complaint  Patient presents with   Medical Management of Chronic Issues   Pt presents to the office today for chronic follow and pain medication refill.  She has trigeminal neuralgia that comes and goes that can be a 2-10 out 10. She has hidradenitis and gets abscess groin. She has 1 active that are swollen and tender. No discharge.    She fell on 09/10/22 at work and is followed by Ortho right hand. Doing PT.  Hypertension This is a chronic problem. The current episode started more than 1 year ago. The problem has been resolved since onset. The problem is controlled. Associated symptoms include malaise/fatigue. Pertinent negatives include no peripheral edema or shortness of breath. Risk factors for coronary artery disease include dyslipidemia, obesity, sedentary lifestyle and smoking/tobacco exposure. The current treatment provides moderate improvement.  Nicotine Dependence Presents for follow-up visit. Symptoms include insomnia. Her urge triggers include company of smokers. The symptoms have been stable. She smokes 1 pack of cigarettes per day.  Insomnia Primary symptoms: difficulty falling asleep, frequent awakening, malaise/fatigue.   The current episode started more than one year. The onset quality is gradual. The problem occurs intermittently. Past treatments include medication. The treatment provided mild relief.  Hyperlipidemia This is a chronic problem. The current episode started more than 1 year ago. The problem is uncontrolled. Recent lipid tests were reviewed and are high. Exacerbating diseases include obesity. Pertinent negatives include no shortness of breath. Current antihyperlipidemic treatment includes statins. The current treatment provides mild improvement of lipids. Risk factors for coronary artery disease include dyslipidemia, hypertension, a sedentary lifestyle and  post-menopausal.  Back Pain This is a chronic problem. The current episode started more than 1 year ago. The problem occurs intermittently. The problem has been waxing and waning since onset. The pain is present in the lumbar spine. The quality of the pain is described as aching. The pain is at a severity of 4/10. The pain is moderate. She has tried bed rest and analgesics for the symptoms. The treatment provided mild relief.   Current opioids rx- Norco 10-325 mg # meds rx- 90 Effectiveness of current meds-Stable  Adverse reactions from pain meds-none Morphine equivalent- 30   Pill count performed-No Last drug screen - today ( high risk q26m, moderate risk q69m, low risk yearly ) Urine drug screen today- Yes Was the [facility] reviewed- yes              If yes were their any concerning findings? - none     Pain contract signed on: 04/27/22   Review of Systems  Constitutional:  Positive for malaise/fatigue.  Respiratory:  Negative for shortness of breath.   Musculoskeletal:  Positive for back pain.  Psychiatric/Behavioral:  The patient has insomnia.   All other systems reviewed and are negative.      Objective:   Physical Exam Vitals reviewed.  Constitutional:      General: She is not in acute distress.    Appearance: She is well-developed.  HENT:     Head: Normocephalic and atraumatic.     Right Ear: Tympanic membrane normal.     Left Ear: Tympanic membrane normal.  Eyes:     Pupils: Pupils are equal, round, and reactive to light.  Neck:     Thyroid: No thyromegaly.  Cardiovascular:     Rate and Rhythm: Normal rate and  regular rhythm.     Heart sounds: Normal heart sounds. No murmur heard. Pulmonary:     Effort: Pulmonary effort is normal. No respiratory distress.     Breath sounds: Normal breath sounds. No wheezing.  Abdominal:     General: Bowel sounds are normal. There is no distension.     Palpations: Abdomen is soft.     Tenderness: There is no abdominal tenderness.   Musculoskeletal:        General: No tenderness. Normal range of motion.     Cervical back: Normal range of motion and neck supple.  Skin:    General: Skin is warm and dry.  Neurological:     Mental Status: She is alert and oriented to person, place, and time.     Cranial Nerves: No cranial nerve deficit.     Deep Tendon Reflexes: Reflexes are normal and symmetric.  Psychiatric:        Behavior: Behavior normal.        Thought Content: Thought content normal.        Judgment: Judgment normal.       BP 125/67   Pulse 81   Temp 97.7 F (36.5 C) (Temporal)   Ht 5\' 5"  (1.651 m)   Wt 155 lb (70.3 kg)   SpO2 100%   BMI 25.79 kg/m      Assessment & Plan:  Grace [name] comes in today with chief complaint of Medical Management of Chronic Issues   Diagnosis and orders addressed:  1. Primary hypertension - amLODipine (NORVASC) 5 MG tablet; Take 1 tablet (5 mg total) by mouth daily.  Dispense: 90 tablet; Refill: 2 - losartan (COZAAR) 25 MG tablet; Take 1 tablet (25 mg total) by mouth daily.  Dispense: 90 tablet; Refill: 2  2. Trigeminal neuralgia - HYDROcodone-acetaminophen (NORCO) 10-325 MG tablet; Take 1 tablet by mouth every 8 (eight) hours as needed.  Dispense: 90 tablet; Refill: 0 - HYDROcodone-acetaminophen (NORCO) 10-325 MG tablet; Take 1 tablet by mouth every 8 (eight) hours as needed.  Dispense: 90 tablet; Refill: 0 - HYDROcodone-acetaminophen (NORCO) 10-325 MG tablet; Take 1 tablet by mouth every 8 (eight) hours as needed.  Dispense: 90 tablet; Refill: 0  3. Uncomplicated opioid dependence (HCC) - HYDROcodone-acetaminophen (NORCO) 10-325 MG tablet; Take 1 tablet by mouth every 8 (eight) hours as needed.  Dispense: 90 tablet; Refill: 0 - HYDROcodone-acetaminophen (NORCO) 10-325 MG tablet; Take 1 tablet by mouth every 8 (eight) hours as needed.  Dispense: 90 tablet; Refill: 0 - HYDROcodone-acetaminophen (NORCO) 10-325 MG tablet; Take 1 tablet by mouth every 8 (eight)  hours as needed.  Dispense: 90 tablet; Refill: 0  4. Controlled substance agreement signed - HYDROcodone-acetaminophen (NORCO) 10-325 MG tablet; Take 1 tablet by mouth every 8 (eight) hours as needed.  Dispense: 90 tablet; Refill: 0 - HYDROcodone-acetaminophen (NORCO) 10-325 MG tablet; Take 1 tablet by mouth every 8 (eight) hours as needed.  Dispense: 90 tablet; Refill: 0 - HYDROcodone-acetaminophen (NORCO) 10-325 MG tablet; Take 1 tablet by mouth every 8 (eight) hours as needed.  Dispense: 90 tablet; Refill: 0  5. Chronic low back pain, unspecified back pain laterality, unspecified whether sciatica present  - HYDROcodone-acetaminophen (NORCO) 10-325 MG tablet; Take 1 tablet by mouth every 8 (eight) hours as needed.  Dispense: 90 tablet; Refill: 0 - HYDROcodone-acetaminophen (NORCO) 10-325 MG tablet; Take 1 tablet by mouth every 8 (eight) hours as needed.  Dispense: 90 tablet; Refill: 0 - HYDROcodone-acetaminophen (NORCO) 10-325 MG tablet; Take 1 tablet by  mouth every 8 (eight) hours as needed.  Dispense: 90 tablet; Refill: 0  6. Chronic bilateral low back pain, unspecified whether sciatica present - HYDROcodone-acetaminophen (NORCO) 10-325 MG tablet; Take 1 tablet by mouth every 8 (eight) hours as needed.  Dispense: 90 tablet; Refill: 0 - HYDROcodone-acetaminophen (NORCO) 10-325 MG tablet; Take 1 tablet by mouth every 8 (eight) hours as needed.  Dispense: 90 tablet; Refill: 0 - HYDROcodone-acetaminophen (NORCO) 10-325 MG tablet; Take 1 tablet by mouth every 8 (eight) hours as needed.  Dispense: 90 tablet; Refill: 0  7. Hidradenitis   8. Pure hypercholesterolemia  9. Insomnia, unspecified type  10. Tobacco user  11. Vitamin D deficiency   Continue current medications  Patient reviewed in [place] controlled database, no flags noted. Contract and drug screen are up to date.  Health Maintenance reviewed Diet and exercise encouraged  Follow up plan: 3 months   Evelina Dun, FNP

## 2023-02-21 ENCOUNTER — Encounter: Payer: Self-pay | Admitting: Family

## 2023-03-07 ENCOUNTER — Other Ambulatory Visit: Payer: Self-pay | Admitting: *Deleted

## 2023-03-07 DIAGNOSIS — J329 Chronic sinusitis, unspecified: Secondary | ICD-10-CM

## 2023-04-18 ENCOUNTER — Ambulatory Visit: Payer: BC Managed Care – PPO | Admitting: Family

## 2023-04-18 ENCOUNTER — Encounter: Payer: Self-pay | Admitting: Family

## 2023-04-18 VITALS — BP 118/59 | HR 79 | Temp 97.5°F | Ht 65.0 in | Wt 158.0 lb

## 2023-04-18 DIAGNOSIS — E559 Vitamin D deficiency, unspecified: Secondary | ICD-10-CM

## 2023-04-18 DIAGNOSIS — M545 Low back pain, unspecified: Secondary | ICD-10-CM

## 2023-04-18 DIAGNOSIS — G4700 Insomnia, unspecified: Secondary | ICD-10-CM

## 2023-04-18 DIAGNOSIS — Z79899 Other long term (current) drug therapy: Secondary | ICD-10-CM | POA: Diagnosis not present

## 2023-04-18 DIAGNOSIS — G8929 Other chronic pain: Secondary | ICD-10-CM | POA: Diagnosis not present

## 2023-04-18 DIAGNOSIS — G500 Trigeminal neuralgia: Secondary | ICD-10-CM

## 2023-04-18 DIAGNOSIS — E7800 Pure hypercholesterolemia, unspecified: Secondary | ICD-10-CM | POA: Diagnosis not present

## 2023-04-18 DIAGNOSIS — I10 Essential (primary) hypertension: Secondary | ICD-10-CM

## 2023-04-18 DIAGNOSIS — Z720 Tobacco use: Secondary | ICD-10-CM

## 2023-04-18 DIAGNOSIS — L732 Hidradenitis suppurativa: Secondary | ICD-10-CM

## 2023-04-18 DIAGNOSIS — F1120 Opioid dependence, uncomplicated: Secondary | ICD-10-CM

## 2023-04-18 MED ORDER — CEPHALEXIN 500 MG PO CAPS
500.0000 mg | ORAL_CAPSULE | Freq: Three times a day (TID) | ORAL | 0 refills | Status: DC
Start: 2023-04-18 — End: 2023-07-16

## 2023-04-18 MED ORDER — HYDROCODONE-ACETAMINOPHEN 10-325 MG PO TABS
1.0000 | ORAL_TABLET | Freq: Three times a day (TID) | ORAL | 0 refills | Status: DC | PRN
Start: 2023-05-13 — End: 2023-07-15

## 2023-04-18 MED ORDER — HYDROCODONE-ACETAMINOPHEN 10-325 MG PO TABS
1.0000 | ORAL_TABLET | Freq: Three times a day (TID) | ORAL | 0 refills | Status: DC | PRN
Start: 2023-06-11 — End: 2023-07-15

## 2023-04-18 MED ORDER — HYDROCODONE-ACETAMINOPHEN 10-325 MG PO TABS: 1.0000 | ORAL_TABLET | Freq: Three times a day (TID) | ORAL | 0 refills | Status: DC | PRN

## 2023-04-18 NOTE — Patient Instructions (Signed)

## 2023-04-18 NOTE — Progress Notes (Signed)
Subjective:    Patient ID: Grace [name], female    DOB: [birth year], 62 y.o.   MRN: [id number]  Chief Complaint  Patient presents with   Medical Management of Chronic Issues   Pt presents to the office today for chronic follow and pain medication refill.  She has trigeminal neuralgia that comes and goes that can be a 3-10 out 10.   She has hidradenitis and gets abscess in her groin.She has 1 active that are swollen and tender. No discharge.    She fell on 09/10/22 at work and is followed by Ortho right hand. Doing PT. Has had steroid injections every 6 weeks without relief.  Hypertension This is a chronic problem. The current episode started more than 1 year ago. The problem has been resolved since onset. The problem is controlled. Pertinent negatives include no malaise/fatigue, peripheral edema or shortness of breath. Risk factors for coronary artery disease include dyslipidemia, diabetes mellitus and sedentary lifestyle. The current treatment provides moderate improvement.  Insomnia Primary symptoms: difficulty falling asleep, frequent awakening, no malaise/fatigue.   The current episode started more than one year. The onset quality is gradual. The symptoms are relieved by medication. Past treatments include meditation. The treatment provided moderate relief.  Hyperlipidemia This is a chronic problem. The current episode started more than 1 year ago. The problem is uncontrolled. Recent lipid tests were reviewed and are high. Pertinent negatives include no shortness of breath. Current antihyperlipidemic treatment includes statins. The current treatment provides moderate improvement of lipids. Risk factors for coronary artery disease include dyslipidemia, hypertension, a sedentary lifestyle and post-menopausal.  Back Pain This is a chronic problem. The current episode started more than 1 year ago. The problem occurs intermittently. The problem has been waxing and waning since onset. The pain  is present in the lumbar spine. The quality of the pain is described as aching. The pain is at a severity of 5/10. The pain is moderate. She has tried analgesics for the symptoms. The treatment provided moderate relief.  Nicotine Dependence Presents for follow-up visit. Symptoms include insomnia. Her urge triggers include company of smokers. The symptoms have been stable. She smokes 1 pack of cigarettes per day.   Current opioids rx- Norco 10-325 mg # meds rx- 90 Effectiveness of current meds-Stable  Adverse reactions from pain meds-none Morphine equivalent- 30   Pill count performed-No Last drug screen - today ( high risk q61m, moderate risk q70m, low risk yearly ) Urine drug screen today- Yes Was the [facility] reviewed- yes              If yes were their any concerning findings? - none     Pain contract signed on: 04/27/22   Review of Systems  Constitutional:  Negative for malaise/fatigue.  Respiratory:  Negative for shortness of breath.   Musculoskeletal:  Positive for back pain.  Psychiatric/Behavioral:  The patient has insomnia.   All other systems reviewed and are negative.      Objective:   Physical Exam Vitals reviewed.  Constitutional:      General: She is not in acute distress.    Appearance: She is well-developed. She is obese.  HENT:     Head: Normocephalic and atraumatic.     Right Ear: Tympanic membrane normal.     Left Ear: Tympanic membrane normal.  Eyes:     Pupils: Pupils are equal, round, and reactive to light.  Neck:     Thyroid: No thyromegaly.  Cardiovascular:  Rate and Rhythm: Normal rate and regular rhythm.     Heart sounds: Normal heart sounds. No murmur heard. Pulmonary:     Effort: Pulmonary effort is normal. No respiratory distress.     Breath sounds: Normal breath sounds. No wheezing.  Abdominal:     General: Bowel sounds are normal. There is no distension.     Palpations: Abdomen is soft.     Tenderness: There is no abdominal tenderness.   Musculoskeletal:        General: No tenderness. Normal range of motion.     Cervical back: Normal range of motion and neck supple.  Skin:    General: Skin is warm and dry.          Comments: Hard abscess in groin, no discharge  Neurological:     Mental Status: She is alert and oriented to person, place, and time.     Cranial Nerves: No cranial nerve deficit.     Deep Tendon Reflexes: Reflexes are normal and symmetric.  Psychiatric:        Behavior: Behavior normal.        Thought Content: Thought content normal.        Judgment: Judgment normal.      BP (!) 118/59   Pulse 79   Temp (!) 97.5 F (36.4 C) (Temporal)   Ht  (1.651 m)   Wt 158 lb (71.7 kg)   SpO2 100%   BMI 26.29 kg/m       Assessment & Plan:  KAULA KLENKE comes in today with chief complaint of Medical Management of Chronic Issues   Diagnosis and orders addressed:  1. Chronic bilateral low back pain, unspecified whether sciatica present - ToxASSURE Select 13 (MW), Urine - HYDROcodone-acetaminophen (NORCO) 10-325 MG tablet; Take 1 tablet by mouth every 8 (eight) hours as needed.  Dispense: 110 tablet; Refill: 0 - HYDROcodone-acetaminophen (NORCO) 10-325 MG tablet; Take 1 tablet by mouth every 8 (eight) hours as needed.  Dispense: 90 tablet; Refill: 0 - HYDROcodone-acetaminophen (NORCO) 10-325 MG tablet; Take 1 tablet by mouth every 8 (eight) hours as needed.  Dispense: 90 tablet; Refill: 0  2. Controlled substance agreement signed - ToxASSURE Select 13 (MW), Urine - HYDROcodone-acetaminophen (NORCO) 10-325 MG tablet; Take 1 tablet by mouth every 8 (eight) hours as needed.  Dispense: 110 tablet; Refill: 0 - HYDROcodone-acetaminophen (NORCO) 10-325 MG tablet; Take 1 tablet by mouth every 8 (eight) hours as needed.  Dispense: 90 tablet; Refill: 0 - HYDROcodone-acetaminophen (NORCO) 10-325 MG tablet; Take 1 tablet by mouth every 8 (eight) hours as needed.  Dispense: 90 tablet; Refill: 0  3.  Hidradenitis - cephALEXin (KEFLEX) 500 MG capsule; Take 1 capsule (500 mg total) by mouth 3 (three) times daily.  Dispense: 21 capsule; Refill: 0  4. Pure hypercholesterolemia  5. Primary hypertension  6. Insomnia, unspecified type  7. Uncomplicated opioid dependence - HYDROcodone-acetaminophen (NORCO) 10-325 MG tablet; Take 1 tablet by mouth every 8 (eight) hours as needed.  Dispense: 110 tablet; Refill: 0 - HYDROcodone-acetaminophen (NORCO) 10-325 MG tablet; Take 1 tablet by mouth every 8 (eight) hours as needed.  Dispense: 90 tablet; Refill: 0 - HYDROcodone-acetaminophen (NORCO) 10-325 MG tablet; Take 1 tablet by mouth every 8 (eight) hours as needed.  Dispense: 90 tablet; Refill: 0  8. Vitamin D deficiency  9. Trigeminal neuralgia - HYDROcodone-acetaminophen (NORCO) 10-325 MG tablet; Take 1 tablet by mouth every 8 (eight) hours as needed.  Dispense: 110 tablet; Refill: 0 - HYDROcodone-acetaminophen (NORCO)  10-325 MG tablet; Take 1 tablet by mouth every 8 (eight) hours as needed.  Dispense: 90 tablet; Refill: 0 - HYDROcodone-acetaminophen (NORCO) 10-325 MG tablet; Take 1 tablet by mouth every 8 (eight) hours as needed.  Dispense: 90 tablet; Refill: 0  10. Tobacco user  11. Chronic low back pain, unspecified back pain laterality, unspecified whether sciatica present - HYDROcodone-acetaminophen (NORCO) 10-325 MG tablet; Take 1 tablet by mouth every 8 (eight) hours as needed.  Dispense: 110 tablet; Refill: 0 - HYDROcodone-acetaminophen (NORCO) 10-325 MG tablet; Take 1 tablet by mouth every 8 (eight) hours as needed.  Dispense: 90 tablet; Refill: 0 - HYDROcodone-acetaminophen (NORCO) 10-325 MG tablet; Take 1 tablet by mouth every 8 (eight) hours as needed.  Dispense: 90 tablet; Refill: 0   Labs pending Patient reviewed in [place] controlled database, no flags noted. Contract and drug screen are up to date.  Health Maintenance reviewed Diet and exercise encouraged  Follow up plan: 3  months    Jannifer Rodney, FNP

## 2023-04-24 LAB — TOXASSURE SELECT 13 (MW), URINE

## 2023-05-16 ENCOUNTER — Telehealth: Payer: Self-pay | Admitting: Family

## 2023-05-16 NOTE — Telephone Encounter (Signed)
Patient aware and verbalized understanding. Pharmacy is also aware.

## 2023-05-16 NOTE — Telephone Encounter (Signed)
Patient states this is a 30day script christy gives her the 110 incase they are out of stock. Wants it looked back into her chart and see where Neysa Bonito foes this for her.

## 2023-05-16 NOTE — Telephone Encounter (Signed)
Please let pharmacy know- ok to fill as dated on prescriptions

## 2023-07-16 ENCOUNTER — Encounter: Payer: Self-pay | Admitting: Family

## 2023-07-16 ENCOUNTER — Ambulatory Visit: Payer: BC Managed Care – PPO | Admitting: Family

## 2023-07-16 VITALS — BP 118/67 | HR 91 | Temp 97.2°F | Ht 65.0 in | Wt 155.6 lb

## 2023-07-16 DIAGNOSIS — M545 Low back pain, unspecified: Secondary | ICD-10-CM | POA: Diagnosis not present

## 2023-07-16 DIAGNOSIS — G8929 Other chronic pain: Secondary | ICD-10-CM

## 2023-07-16 DIAGNOSIS — I10 Essential (primary) hypertension: Secondary | ICD-10-CM | POA: Diagnosis not present

## 2023-07-16 DIAGNOSIS — E7800 Pure hypercholesterolemia, unspecified: Secondary | ICD-10-CM

## 2023-07-16 DIAGNOSIS — Z720 Tobacco use: Secondary | ICD-10-CM

## 2023-07-16 DIAGNOSIS — G500 Trigeminal neuralgia: Secondary | ICD-10-CM

## 2023-07-16 DIAGNOSIS — L732 Hidradenitis suppurativa: Secondary | ICD-10-CM

## 2023-07-16 DIAGNOSIS — G4700 Insomnia, unspecified: Secondary | ICD-10-CM

## 2023-07-16 DIAGNOSIS — Z79899 Other long term (current) drug therapy: Secondary | ICD-10-CM

## 2023-07-16 DIAGNOSIS — F1120 Opioid dependence, uncomplicated: Secondary | ICD-10-CM

## 2023-07-16 MED ORDER — HYDROCODONE-ACETAMINOPHEN 10-325 MG PO TABS
1.0000 | ORAL_TABLET | Freq: Four times a day (QID) | ORAL | 0 refills | Status: DC | PRN
Start: 2023-07-16 — End: 2023-10-11

## 2023-07-16 MED ORDER — LOSARTAN POTASSIUM 25 MG PO TABS: 25.0000 mg | ORAL_TABLET | Freq: Every day | ORAL | 2 refills | Status: DC

## 2023-07-16 MED ORDER — ROSUVASTATIN CALCIUM 10 MG PO TABS
10.0000 mg | ORAL_TABLET | Freq: Every day | ORAL | 1 refills | Status: DC
Start: 2023-07-16 — End: 2024-04-10

## 2023-07-16 MED ORDER — TRAZODONE HCL 150 MG PO TABS
150.0000 mg | ORAL_TABLET | Freq: Every day | ORAL | 6 refills | Status: DC
Start: 2023-07-16 — End: 2024-04-10

## 2023-07-16 MED ORDER — HYDROCODONE-ACETAMINOPHEN 10-325 MG PO TABS
1.0000 | ORAL_TABLET | Freq: Three times a day (TID) | ORAL | 0 refills | Status: DC | PRN
Start: 2023-08-14 — End: 2023-10-11

## 2023-07-16 MED ORDER — CEPHALEXIN 500 MG PO CAPS
500.0000 mg | ORAL_CAPSULE | Freq: Three times a day (TID) | ORAL | 1 refills | Status: DC
Start: 2023-07-16 — End: 2023-10-11

## 2023-07-16 MED ORDER — HYDROCODONE-ACETAMINOPHEN 10-325 MG PO TABS
1.0000 | ORAL_TABLET | Freq: Three times a day (TID) | ORAL | 0 refills | Status: DC | PRN
Start: 2023-07-16 — End: 2023-07-16

## 2023-07-16 MED ORDER — AMLODIPINE BESYLATE 5 MG PO TABS
5.0000 mg | ORAL_TABLET | Freq: Every day | ORAL | 2 refills | Status: DC
Start: 2023-07-16 — End: 2024-04-10

## 2023-07-16 MED ORDER — HYDROCODONE-ACETAMINOPHEN 10-325 MG PO TABS
1.0000 | ORAL_TABLET | Freq: Three times a day (TID) | ORAL | 0 refills | Status: DC | PRN
Start: 2023-09-10 — End: 2023-10-11

## 2023-07-16 NOTE — Progress Notes (Signed)
Subjective:    Patient ID: Grace [name], female    DOB: [birth year], 62 y.o.   MRN: [id number]  Chief Complaint  Patient presents with   Medical Management of Chronic Issues    Pt presents to the office today for chronic follow and pain medication refill.  She has trigeminal neuralgia that comes and goes that can be a 3-10 out 10.    She has hidradenitis and gets abscess in her groin.She has 1 active that are swollen and tender. No discharge.    She fell on 09/10/22 at work and is followed by Ortho right hand. Doing PT. Has had steroid injections every 6 weeks without relief.  Hypertension This is a chronic problem. The current episode started more than 1 year ago. The problem has been resolved since onset. The problem is controlled. Pertinent negatives include no malaise/fatigue, peripheral edema or shortness of breath. Risk factors for coronary artery disease include dyslipidemia and sedentary lifestyle. The current treatment provides moderate improvement.  Hyperlipidemia This is a chronic problem. The current episode started more than 1 year ago. The problem is uncontrolled. Recent lipid tests were reviewed and are high. Exacerbating diseases include obesity. Pertinent negatives include no shortness of breath. Current antihyperlipidemic treatment includes statins. The current treatment provides mild improvement of lipids. Risk factors for coronary artery disease include dyslipidemia, hypertension and post-menopausal.  Back Pain This is a chronic problem. The current episode started more than 1 year ago. The problem occurs intermittently. The problem has been waxing and waning since onset. The pain is present in the lumbar spine. The quality of the pain is described as aching. The pain is at a severity of 2/10. The pain is mild. She has tried analgesics and home exercises for the symptoms. The treatment provided mild relief.  Insomnia Primary symptoms: fragmented sleep, difficulty falling  asleep, frequent awakening, no malaise/fatigue.   The current episode started more than one year. The onset quality is gradual. The problem occurs intermittently. Past treatments include meditation. The treatment provided mild relief.  Nicotine Dependence Presents for follow-up visit. Symptoms include insomnia. Her urge triggers include company of smokers. The symptoms have been stable. She smokes 1 pack of cigarettes per day.      Review of Systems  Constitutional:  Negative for malaise/fatigue.  Respiratory:  Negative for shortness of breath.   Musculoskeletal:  Positive for back pain.  Psychiatric/Behavioral:  The patient has insomnia.   All other systems reviewed and are negative.      Objective:   Physical Exam Vitals reviewed.  Constitutional:      General: She is not in acute distress.    Appearance: She is well-developed.  HENT:     Head: Normocephalic and atraumatic.     Right Ear: Tympanic membrane normal.     Left Ear: Tympanic membrane normal.  Eyes:     Pupils: Pupils are equal, round, and reactive to light.  Neck:     Thyroid: No thyromegaly.  Cardiovascular:     Rate and Rhythm: Normal rate and regular rhythm.     Heart sounds: Normal heart sounds. No murmur heard. Pulmonary:     Effort: Pulmonary effort is normal. No respiratory distress.     Breath sounds: Normal breath sounds. No wheezing.  Abdominal:     General: Bowel sounds are normal. There is no distension.     Palpations: Abdomen is soft.     Tenderness: There is no abdominal tenderness.  Musculoskeletal:  General: No tenderness. Normal range of motion.     Cervical back: Normal range of motion and neck supple.  Skin:    General: Skin is warm and dry.  Neurological:     Mental Status: She is alert and oriented to person, place, and time.     Cranial Nerves: No cranial nerve deficit.     Deep Tendon Reflexes: Reflexes are normal and symmetric.  Psychiatric:        Behavior: Behavior  normal.        Thought Content: Thought content normal.        Judgment: Judgment normal.          BP 118/67   Pulse 91   Temp (!) 97.2 F (36.2 C) (Temporal)   Ht 5\' 5"  (1.651 m)   Wt 155 lb 9.6 oz (70.6 kg)   SpO2 98%   BMI 25.89 kg/m   Assessment & Plan:  Grace [name] comes in today with chief complaint of Medical Management of Chronic Issues   Diagnosis and orders addressed:  1. Primary hypertension - amLODipine (NORVASC) 5 MG tablet; Take 1 tablet (5 mg total) by mouth daily.  Dispense: 90 tablet; Refill: 2 - losartan (COZAAR) 25 MG tablet; Take 1 tablet (25 mg total) by mouth daily.  Dispense: 90 tablet; Refill: 2  2. Chronic bilateral low back pain, unspecified whether sciatica present - HYDROcodone-acetaminophen (NORCO) 10-325 MG tablet; Take 1 tablet by mouth every 8 (eight) hours as needed.  Dispense: 90 tablet; Refill: 0 - HYDROcodone-acetaminophen (NORCO) 10-325 MG tablet; Take 1 tablet by mouth every 8 (eight) hours as needed.  Dispense: 90 tablet; Refill: 0 - HYDROcodone-acetaminophen (NORCO) 10-325 MG tablet; Take 1 tablet by mouth every 6 (six) hours as needed.  Dispense: 110 tablet; Refill: 0  3. Controlled substance agreement signed - HYDROcodone-acetaminophen (NORCO) 10-325 MG tablet; Take 1 tablet by mouth every 8 (eight) hours as needed.  Dispense: 90 tablet; Refill: 0 - HYDROcodone-acetaminophen (NORCO) 10-325 MG tablet; Take 1 tablet by mouth every 8 (eight) hours as needed.  Dispense: 90 tablet; Refill: 0 - HYDROcodone-acetaminophen (NORCO) 10-325 MG tablet; Take 1 tablet by mouth every 6 (six) hours as needed.  Dispense: 110 tablet; Refill: 0  4. Uncomplicated opioid dependence (HCC)  - HYDROcodone-acetaminophen (NORCO) 10-325 MG tablet; Take 1 tablet by mouth every 8 (eight) hours as needed.  Dispense: 90 tablet; Refill: 0 - HYDROcodone-acetaminophen (NORCO) 10-325 MG tablet; Take 1 tablet by mouth every 8 (eight) hours as needed.  Dispense: 90  tablet; Refill: 0 - HYDROcodone-acetaminophen (NORCO) 10-325 MG tablet; Take 1 tablet by mouth every 6 (six) hours as needed.  Dispense: 110 tablet; Refill: 0  5. Trigeminal neuralgia - HYDROcodone-acetaminophen (NORCO) 10-325 MG tablet; Take 1 tablet by mouth every 8 (eight) hours as needed.  Dispense: 90 tablet; Refill: 0 - HYDROcodone-acetaminophen (NORCO) 10-325 MG tablet; Take 1 tablet by mouth every 8 (eight) hours as needed.  Dispense: 90 tablet; Refill: 0 - HYDROcodone-acetaminophen (NORCO) 10-325 MG tablet; Take 1 tablet by mouth every 6 (six) hours as needed.  Dispense: 110 tablet; Refill: 0  6. Chronic low back pain, unspecified back pain laterality, unspecified whether sciatica present - HYDROcodone-acetaminophen (NORCO) 10-325 MG tablet; Take 1 tablet by mouth every 8 (eight) hours as needed.  Dispense: 90 tablet; Refill: 0 - HYDROcodone-acetaminophen (NORCO) 10-325 MG tablet; Take 1 tablet by mouth every 8 (eight) hours as needed.  Dispense: 90 tablet; Refill: 0 - HYDROcodone-acetaminophen (NORCO) 10-325 MG tablet; Take  1 tablet by mouth every 6 (six) hours as needed.  Dispense: 110 tablet; Refill: 0  7. Pure hypercholesterolemia - rosuvastatin (CRESTOR) 10 MG tablet; Take 1 tablet (10 mg total) by mouth daily.  Dispense: 90 tablet; Refill: 1  8. Insomnia, unspecified type - traZODone (DESYREL) 150 MG tablet; Take 1 tablet (150 mg total) by mouth at bedtime.  Dispense: 90 tablet; Refill: 6  9. Hidradenitis  - cephALEXin (KEFLEX) 500 MG capsule; Take 1 capsule (500 mg total) by mouth 3 (three) times daily.  Dispense: 21 capsule; Refill: 1  10. Tobacco user    Labs pending Patient reviewed in [place] controlled database, no flags noted. Contract and drug screen are up to date.  Continue current medications  Health Maintenance reviewed Diet and exercise encouraged  Follow up plan: 3 months    Jannifer Rodney, FNP

## 2023-07-16 NOTE — Patient Instructions (Signed)

## 2023-08-21 ENCOUNTER — Other Ambulatory Visit: Payer: Self-pay | Admitting: Family

## 2023-08-21 DIAGNOSIS — Z1231 Encounter for screening mammogram for malignant neoplasm of breast: Secondary | ICD-10-CM

## 2023-08-28 ENCOUNTER — Ambulatory Visit
Admission: RE | Admit: 2023-08-28 | Discharge: 2023-08-28 | Disposition: A | Discharge status: Home / Self Care | Payer: BC Managed Care – PPO | Source: Ambulatory Visit | Attending: Family | Admitting: Family

## 2023-08-28 DIAGNOSIS — Z1231 Encounter for screening mammogram for malignant neoplasm of breast: Secondary | ICD-10-CM

## 2023-10-11 ENCOUNTER — Other Ambulatory Visit (HOSPITAL_COMMUNITY)
Admission: RE | Admit: 2023-10-11 | Discharge: 2023-10-11 | Disposition: A | Discharge status: Home / Self Care | Payer: BC Managed Care – PPO | Source: Ambulatory Visit | Attending: Family | Admitting: Family

## 2023-10-11 ENCOUNTER — Encounter: Payer: Self-pay | Admitting: Family

## 2023-10-11 ENCOUNTER — Ambulatory Visit: Payer: BC Managed Care – PPO | Admitting: Family

## 2023-10-11 VITALS — BP 121/70 | HR 74 | Temp 98.4°F | Ht 65.0 in | Wt 160.2 lb

## 2023-10-11 DIAGNOSIS — Z0000 Encounter for general adult medical examination without abnormal findings: Secondary | ICD-10-CM | POA: Insufficient documentation

## 2023-10-11 DIAGNOSIS — E559 Vitamin D deficiency, unspecified: Secondary | ICD-10-CM | POA: Diagnosis not present

## 2023-10-11 DIAGNOSIS — G8929 Other chronic pain: Secondary | ICD-10-CM

## 2023-10-11 DIAGNOSIS — G4700 Insomnia, unspecified: Secondary | ICD-10-CM

## 2023-10-11 DIAGNOSIS — G500 Trigeminal neuralgia: Secondary | ICD-10-CM

## 2023-10-11 DIAGNOSIS — Z01419 Encounter for gynecological examination (general) (routine) without abnormal findings: Secondary | ICD-10-CM | POA: Diagnosis not present

## 2023-10-11 DIAGNOSIS — Z720 Tobacco use: Secondary | ICD-10-CM

## 2023-10-11 DIAGNOSIS — E7800 Pure hypercholesterolemia, unspecified: Secondary | ICD-10-CM | POA: Diagnosis not present

## 2023-10-11 DIAGNOSIS — Z23 Encounter for immunization: Secondary | ICD-10-CM | POA: Diagnosis not present

## 2023-10-11 DIAGNOSIS — Z79899 Other long term (current) drug therapy: Secondary | ICD-10-CM

## 2023-10-11 DIAGNOSIS — L732 Hidradenitis suppurativa: Secondary | ICD-10-CM

## 2023-10-11 DIAGNOSIS — F1120 Opioid dependence, uncomplicated: Secondary | ICD-10-CM | POA: Diagnosis not present

## 2023-10-11 DIAGNOSIS — M545 Low back pain, unspecified: Secondary | ICD-10-CM

## 2023-10-11 DIAGNOSIS — I10 Essential (primary) hypertension: Secondary | ICD-10-CM

## 2023-10-11 MED ORDER — HYDROCODONE-ACETAMINOPHEN 10-325 MG PO TABS: 1.0000 | ORAL_TABLET | Freq: Four times a day (QID) | ORAL | 0 refills | Status: DC | PRN

## 2023-10-11 MED ORDER — HYDROCODONE-ACETAMINOPHEN 10-325 MG PO TABS: 1.0000 | ORAL_TABLET | Freq: Three times a day (TID) | ORAL | 0 refills | Status: DC | PRN

## 2023-10-11 MED ORDER — CEPHALEXIN 500 MG PO CAPS
500.0000 mg | ORAL_CAPSULE | Freq: Three times a day (TID) | ORAL | 1 refills | Status: DC
Start: 2023-10-11 — End: 2024-01-13

## 2023-10-11 MED ORDER — HYDROCODONE-ACETAMINOPHEN 10-325 MG PO TABS
1.0000 | ORAL_TABLET | Freq: Three times a day (TID) | ORAL | 0 refills | Status: DC | PRN
Start: 2023-12-06 — End: 2024-01-13

## 2023-10-11 NOTE — Progress Notes (Signed)
Subjective:    Patient ID: Grace [name], female    DOB: 1961/05/10, 62 y.o.   MRN: [id number]  Chief Complaint  Patient presents with   Medical Management of Chronic Issues   Annual Exam   Pt presents to the office today for CPE with pap and pain medication refill.  She has trigeminal neuralgia that comes and goes that can be a 4-10 out 10.    She has hidradenitis and gets abscess in her groin.She has 2 active that are swollen and tender on bilateral groin . No discharge.    She fell on 09/10/22 at work and is followed by Ortho right hand. Completed PT. Has had steroid injections every 6 weeks without relief. Back Pain This is a chronic problem. The current episode started more than 1 year ago. The problem occurs intermittently. The problem has been waxing and waning since onset. The pain is present in the lumbar spine. The quality of the pain is described as aching. The pain is at a severity of 4/10. The pain is moderate. She has tried analgesics for the symptoms. The treatment provided moderate relief.  Hypertension This is a chronic problem. The current episode started more than 1 year ago. The problem has been resolved since onset. The problem is controlled. Associated symptoms include malaise/fatigue. Pertinent negatives include no peripheral edema or shortness of breath. Risk factors for coronary artery disease include dyslipidemia and sedentary lifestyle. The current treatment provides moderate improvement.  Nicotine Dependence Presents for follow-up visit. Symptoms include insomnia. Her urge triggers include company of smokers. The symptoms have been stable. She smokes 1 pack of cigarettes per day.  Insomnia Primary symptoms: fragmented sleep, sleep disturbance, difficulty falling asleep, frequent awakening, malaise/fatigue.   Past treatments include meditation. The treatment provided mild relief.  Hyperlipidemia This is a chronic problem. The current episode started more than 1  year ago. Pertinent negatives include no shortness of breath. Current antihyperlipidemic treatment includes statins. The current treatment provides moderate improvement of lipids. Risk factors for coronary artery disease include dyslipidemia, hypertension, a sedentary lifestyle and post-menopausal.  Gynecologic Exam The patient's pertinent negatives include no genital itching, genital lesions, genital odor or vaginal bleeding. Associated symptoms include back pain.      Review of Systems  Constitutional:  Positive for malaise/fatigue.  Respiratory:  Negative for shortness of breath.   Musculoskeletal:  Positive for back pain.  Psychiatric/Behavioral:  Positive for sleep disturbance. The patient has insomnia.   All other systems reviewed and are negative.   Family History  Problem Relation Age of Onset   Hypertension Mother    Dementia Mother    Alcohol abuse Mother    Cancer Father        lung   Breast cancer Half-Sister    Social History   Socioeconomic History   Marital status: Divorced    Spouse name: Not on file   Number of children: Not on file   Years of education: Not on file   Highest education level: Not on file  Occupational History   Not on file  Tobacco Use   Smoking status: Every Day    Current packs/day: 1.00    Types: Cigarettes   Smokeless tobacco: Never  Vaping Use   Vaping status: Never Used  Substance and Sexual Activity   Alcohol use: No   Drug use: No   Sexual activity: Not on file  Other Topics Concern   Not on file  Social History Narrative  Not on file   Social Determinants of Health   Financial Resource Strain: Not on file  Food Insecurity: Not on file  Transportation Needs: Not on file  Physical Activity: Not on file  Stress: Not on file  Social Connections: Not on file       Objective:   Physical Exam Vitals reviewed.  Constitutional:      General: She is not in acute distress.    Appearance: She is well-developed.  HENT:      Head: Normocephalic and atraumatic.     Right Ear: Tympanic membrane normal.     Left Ear: Tympanic membrane normal.  Eyes:     Pupils: Pupils are equal, round, and reactive to light.  Neck:     Thyroid: No thyromegaly.  Cardiovascular:     Rate and Rhythm: Normal rate and regular rhythm.     Heart sounds: Normal heart sounds. No murmur heard. Pulmonary:     Effort: Pulmonary effort is normal. No respiratory distress.     Breath sounds: Normal breath sounds. No wheezing.  Abdominal:     General: Bowel sounds are normal. There is no distension.     Palpations: Abdomen is soft.     Tenderness: There is no abdominal tenderness.  Genitourinary:      Comments: Bimanual exam- no adnexal masses or tenderness, ovaries nonpalpable   Cervix parous and pink- slight discharge  Cyst bilateral groin, no redness or swelling at this time     Musculoskeletal:        General: No tenderness. Normal range of motion.     Cervical back: Normal range of motion and neck supple.  Skin:    General: Skin is warm and dry.  Neurological:     Mental Status: She is alert and oriented to person, place, and time.     Cranial Nerves: No cranial nerve deficit.     Deep Tendon Reflexes: Reflexes are normal and symmetric.  Psychiatric:        Behavior: Behavior normal.        Thought Content: Thought content normal.        Judgment: Judgment normal.          BP 121/70   Pulse 74   Temp 98.4 F (36.9 C) (Temporal)   Ht 5\' 5"  (1.651 m)   Wt 160 lb 3.2 oz (72.7 kg)   SpO2 99%   BMI 26.66 kg/m   Assessment & Plan:  [name] comes in today with chief complaint of Medical Management of Chronic Issues and Annual Exam   Diagnosis and orders addressed:  1. Trigeminal neuralgia - CMP14+EGFR - CBC with Differential/Platelet - HYDROcodone-acetaminophen (NORCO) 10-325 MG tablet; Take 1 tablet by mouth every 8 (eight) hours as needed.  Dispense: 90 tablet; Refill: 0 -  HYDROcodone-acetaminophen (NORCO) 10-325 MG tablet; Take 1 tablet by mouth every 6 (six) hours as needed.  Dispense: 110 tablet; Refill: 0 - HYDROcodone-acetaminophen (NORCO) 10-325 MG tablet; Take 1 tablet by mouth every 8 (eight) hours as needed.  Dispense: 90 tablet; Refill: 0  2. Tobacco user - CMP14+EGFR - CBC with Differential/Platelet  3. Uncomplicated opioid dependence (HCC) - CMP14+EGFR - CBC with Differential/Platelet - HYDROcodone-acetaminophen (NORCO) 10-325 MG tablet; Take 1 tablet by mouth every 8 (eight) hours as needed.  Dispense: 90 tablet; Refill: 0 - HYDROcodone-acetaminophen (NORCO) 10-325 MG tablet; Take 1 tablet by mouth every 6 (six) hours as needed.  Dispense: 110 tablet; Refill: 0 - HYDROcodone-acetaminophen (NORCO) 10-325 MG  tablet; Take 1 tablet by mouth every 8 (eight) hours as needed.  Dispense: 90 tablet; Refill: 0  4. Insomnia, unspecified type - CMP14+EGFR - CBC with Differential/Platelet  5. Primary hypertension - CMP14+EGFR - CBC with Differential/Platelet  6. Pure hypercholesterolemia - CMP14+EGFR - CBC with Differential/Platelet - Lipid panel  7. Controlled substance agreement signed - CMP14+EGFR - CBC with Differential/Platelet - HYDROcodone-acetaminophen (NORCO) 10-325 MG tablet; Take 1 tablet by mouth every 8 (eight) hours as needed.  Dispense: 90 tablet; Refill: 0 - HYDROcodone-acetaminophen (NORCO) 10-325 MG tablet; Take 1 tablet by mouth every 6 (six) hours as needed.  Dispense: 110 tablet; Refill: 0 - HYDROcodone-acetaminophen (NORCO) 10-325 MG tablet; Take 1 tablet by mouth every 8 (eight) hours as needed.  Dispense: 90 tablet; Refill: 0  8. Chronic bilateral low back pain, unspecified whether sciatica present - CMP14+EGFR - CBC with Differential/Platelet - HYDROcodone-acetaminophen (NORCO) 10-325 MG tablet; Take 1 tablet by mouth every 8 (eight) hours as needed.  Dispense: 90 tablet; Refill: 0 - HYDROcodone-acetaminophen (NORCO)  10-325 MG tablet; Take 1 tablet by mouth every 6 (six) hours as needed.  Dispense: 110 tablet; Refill: 0 - HYDROcodone-acetaminophen (NORCO) 10-325 MG tablet; Take 1 tablet by mouth every 8 (eight) hours as needed.  Dispense: 90 tablet; Refill: 0  9. Hidradenitis - CMP14+EGFR - CBC with Differential/Platelet - cephALEXin (KEFLEX) 500 MG capsule; Take 1 capsule (500 mg total) by mouth 3 (three) times daily.  Dispense: 21 capsule; Refill: 1  10. Vitamin D deficiency - CMP14+EGFR - CBC with Differential/Platelet  11. Annual physical exam - CMP14+EGFR - CBC with Differential/Platelet - Lipid panel  12. Chronic low back pain, unspecified back pain laterality, unspecified whether sciatica present - HYDROcodone-acetaminophen (NORCO) 10-325 MG tablet; Take 1 tablet by mouth every 8 (eight) hours as needed.  Dispense: 90 tablet; Refill: 0 - HYDROcodone-acetaminophen (NORCO) 10-325 MG tablet; Take 1 tablet by mouth every 6 (six) hours as needed.  Dispense: 110 tablet; Refill: 0 - HYDROcodone-acetaminophen (NORCO) 10-325 MG tablet; Take 1 tablet by mouth every 8 (eight) hours as needed.  Dispense: 90 tablet; Refill: 0   Labs pending Patient reviewed in [place] controlled database, no flags noted. Contract and drug screen are up to date.  Continue current medications  Health Maintenance reviewed Diet and exercise encouraged  Follow up plan: 3 months    Jannifer Rodney, FNP

## 2023-10-11 NOTE — Patient Instructions (Signed)

## 2023-10-12 LAB — CBC WITH DIFFERENTIAL/PLATELET
Basophils Absolute: 0 10*3/uL (ref 0.0–0.2)
Basos: 0 %
EOS (ABSOLUTE): 0.1 10*3/uL (ref 0.0–0.4)
Eos: 2 %
Hematocrit: 42.2 % (ref 34.0–46.6)
Hemoglobin: 13.5 g/dL (ref 11.1–15.9)
Immature Grans (Abs): 0 10*3/uL (ref 0.0–0.1)
Immature Granulocytes: 0 %
Lymphocytes Absolute: 2.2 10*3/uL (ref 0.7–3.1)
Lymphs: 31 %
MCH: 30.3 pg (ref 26.6–33.0)
MCHC: 32 g/dL (ref 31.5–35.7)
MCV: 95 fL (ref 79–97)
Monocytes Absolute: 0.4 10*3/uL (ref 0.1–0.9)
Monocytes: 6 %
Neutrophils Absolute: 4.2 10*3/uL (ref 1.4–7.0)
Neutrophils: 61 %
Platelets: 261 10*3/uL (ref 150–450)
RBC: 4.46 x10E6/uL (ref 3.77–5.28)
RDW: 12.3 % (ref 11.7–15.4)
WBC: 7 10*3/uL (ref 3.4–10.8)

## 2023-10-12 LAB — CMP14+EGFR
ALT: 9 [IU]/L (ref 0–32)
AST: 13 [IU]/L (ref 0–40)
Albumin: 4.1 g/dL (ref 3.9–4.9)
Alkaline Phosphatase: 64 [IU]/L (ref 44–121)
BUN/Creatinine Ratio: 11 — ABNORMAL LOW (ref 12–28)
BUN: 9 mg/dL (ref 8–27)
Bilirubin Total: 0.2 mg/dL (ref 0.0–1.2)
CO2: 22 mmol/L (ref 20–29)
Calcium: 9 mg/dL (ref 8.7–10.3)
Chloride: 103 mmol/L (ref 96–106)
Creatinine, Ser: 0.82 mg/dL (ref 0.57–1.00)
Globulin, Total: 2.4 g/dL (ref 1.5–4.5)
Glucose: 86 mg/dL (ref 70–99)
Potassium: 4.4 mmol/L (ref 3.5–5.2)
Sodium: 139 mmol/L (ref 134–144)
Total Protein: 6.5 g/dL (ref 6.0–8.5)
eGFR: 80 mL/min/{1.73_m2} (ref >59)

## 2023-10-12 LAB — LIPID PANEL
Chol/HDL Ratio: 3.9 {ratio} (ref 0.0–4.4)
Cholesterol, Total: 187 mg/dL (ref 100–199)
HDL: 48 mg/dL (ref >39)
LDL Chol Calc (NIH): 116 mg/dL — ABNORMAL HIGH (ref 0–99)
Triglycerides: 129 mg/dL (ref 0–149)
VLDL Cholesterol Cal: 23 mg/dL (ref 5–40)

## 2023-10-12 LAB — VITAMIN D 25 HYDROXY (VIT D DEFICIENCY, FRACTURES): Vit D, 25-Hydroxy: 33.5 ng/mL (ref 30.0–100.0)

## 2023-10-14 LAB — CYTOLOGY - PAP: Diagnosis: NEGATIVE

## 2024-01-10 ENCOUNTER — Ambulatory Visit: Payer: BC Managed Care – PPO | Admitting: Family

## 2024-01-13 ENCOUNTER — Encounter: Payer: Self-pay | Admitting: Family

## 2024-01-13 ENCOUNTER — Ambulatory Visit: Payer: BC Managed Care – PPO | Admitting: Family

## 2024-01-13 VITALS — BP 112/58 | HR 80 | Temp 97.6°F | Ht <= 58 in | Wt 161.0 lb

## 2024-01-13 DIAGNOSIS — Z79899 Other long term (current) drug therapy: Secondary | ICD-10-CM

## 2024-01-13 DIAGNOSIS — E559 Vitamin D deficiency, unspecified: Secondary | ICD-10-CM

## 2024-01-13 DIAGNOSIS — E7800 Pure hypercholesterolemia, unspecified: Secondary | ICD-10-CM

## 2024-01-13 DIAGNOSIS — L732 Hidradenitis suppurativa: Secondary | ICD-10-CM

## 2024-01-13 DIAGNOSIS — M545 Low back pain, unspecified: Secondary | ICD-10-CM

## 2024-01-13 DIAGNOSIS — G8929 Other chronic pain: Secondary | ICD-10-CM

## 2024-01-13 DIAGNOSIS — Z720 Tobacco use: Secondary | ICD-10-CM

## 2024-01-13 DIAGNOSIS — F1120 Opioid dependence, uncomplicated: Secondary | ICD-10-CM | POA: Diagnosis not present

## 2024-01-13 DIAGNOSIS — I10 Essential (primary) hypertension: Secondary | ICD-10-CM

## 2024-01-13 DIAGNOSIS — G500 Trigeminal neuralgia: Secondary | ICD-10-CM | POA: Diagnosis not present

## 2024-01-13 DIAGNOSIS — G4700 Insomnia, unspecified: Secondary | ICD-10-CM

## 2024-01-13 MED ORDER — HYDROCODONE-ACETAMINOPHEN 10-325 MG PO TABS: 1.0000 | ORAL_TABLET | Freq: Four times a day (QID) | ORAL | 0 refills | Status: DC | PRN

## 2024-01-13 NOTE — Progress Notes (Signed)
 Subjective:    Patient ID: Grace [name], female    DOB: [birth year], 63 y.o.   MRN: [id number]  Chief Complaint  Patient presents with   Medical Management of Chronic Issues   Pt presents to the office today for chronic follow up and pain medication refill.  She has trigeminal neuralgia that comes and goes that can be a 4-10 out 10.    She has hidradenitis and gets abscess in her groin.She has 2-3 active that are swollen and tender on bilateral groin . No discharge.    She fell on 09/10/22 at work and is followed by Ortho right hand. Completed PT. Has had steroid injections every 6 weeks without relief. Back Pain This is a chronic problem. The current episode started more than 1 year ago. The problem occurs intermittently. The problem has been waxing and waning since onset. The pain is present in the lumbar spine. The quality of the pain is described as aching. The pain is at a severity of 5/10. The pain is moderate. The symptoms are aggravated by standing. Risk factors include obesity. She has tried analgesics for the symptoms. The treatment provided moderate relief.  Hypertension This is a chronic problem. The current episode started more than 1 year ago. The problem has been resolved since onset. The problem is controlled. Associated symptoms include malaise/fatigue. Pertinent negatives include no peripheral edema or shortness of breath. Risk factors for coronary artery disease include dyslipidemia and sedentary lifestyle. The current treatment provides moderate improvement.  Nicotine Dependence Presents for follow-up visit. The symptoms have been stable. She smokes 1 pack of cigarettes per day.  Insomnia Primary symptoms: fragmented sleep, sleep disturbance, difficulty falling asleep, frequent awakening, malaise/fatigue.   Past treatments include meditation. The treatment provided mild relief.  Hyperlipidemia This is a chronic problem. The current episode started more than 1 year ago.  The problem is uncontrolled. Recent lipid tests were reviewed and are high. Pertinent negatives include no shortness of breath. Current antihyperlipidemic treatment includes statins (weekly). The current treatment provides moderate improvement of lipids. Risk factors for coronary artery disease include dyslipidemia, hypertension, a sedentary lifestyle and post-menopausal.   Current opioids rx- Norco 10-325 mg # meds rx- 90 Effectiveness of current meds-Stable  Adverse reactions from pain meds-none Morphine equivalent- 30   Pill count performed-No Last drug screen - today ( high risk q76m, moderate risk q75m, low risk yearly ) Urine drug screen today- Yes Was the [facility] reviewed- yes              If yes were their any concerning findings? - none     Pain contract signed on: 07/16/23   Review of Systems  Constitutional:  Positive for malaise/fatigue.  Respiratory:  Negative for shortness of breath.   Musculoskeletal:  Positive for back pain.  Psychiatric/Behavioral:  Positive for sleep disturbance.   All other systems reviewed and are negative.   Family History  Problem Relation Age of Onset   Hypertension Mother    Dementia Mother    Alcohol abuse Mother    Cancer Father        lung   Breast cancer Half-Sister    Social History   Socioeconomic History   Marital status: Divorced    Spouse name: Not on file   Number of children: Not on file   Years of education: Not on file   Highest education level: Not on file  Occupational History   Not on file  Tobacco Use  Smoking status: Every Day    Current packs/day: 1.00    Types: Cigarettes   Smokeless tobacco: Never  Vaping Use   Vaping status: Never Used  Substance and Sexual Activity   Alcohol use: No   Drug use: No   Sexual activity: Not on file  Other Topics Concern   Not on file  Social History Narrative   Not on file   Social Drivers of Health   Financial Resource Strain: Not on file  Food Insecurity: Not  on file  Transportation Needs: Not on file  Physical Activity: Not on file  Stress: Not on file  Social Connections: Not on file       Objective:   Physical Exam Vitals reviewed.  Constitutional:      General: She is not in acute distress.    Appearance: She is well-developed.  HENT:     Head: Normocephalic and atraumatic.     Right Ear: Tympanic membrane normal.     Left Ear: Tympanic membrane normal.  Eyes:     Pupils: Pupils are equal, round, and reactive to light.  Neck:     Thyroid: No thyromegaly.  Cardiovascular:     Rate and Rhythm: Normal rate and regular rhythm.     Heart sounds: Normal heart sounds. No murmur heard. Pulmonary:     Effort: Pulmonary effort is normal. No respiratory distress.     Breath sounds: Normal breath sounds. No wheezing.  Abdominal:     General: Bowel sounds are normal. There is no distension.     Palpations: Abdomen is soft.     Tenderness: There is no abdominal tenderness.  Genitourinary:    Comments:   Musculoskeletal:        General: No tenderness. Normal range of motion.     Cervical back: Normal range of motion and neck supple.  Skin:    General: Skin is warm and dry.  Neurological:     Mental Status: She is alert and oriented to person, place, and time.     Cranial Nerves: No cranial nerve deficit.     Deep Tendon Reflexes: Reflexes are normal and symmetric.  Psychiatric:        Behavior: Behavior normal.        Thought Content: Thought content normal.        Judgment: Judgment normal.        BP (!) 145/74   Pulse 80   Temp 97.6 F (36.4 C) (Temporal)   Ht 2' (0.61 m)   Wt 161 lb (73 kg)   SpO2 99%   BMI 196.52 kg/m   Assessment & Plan:  Grace [name] comes in today with chief complaint of Medical Management of Chronic Issues   Diagnosis and orders addressed:   Labs pending Patient reviewed in [place] controlled database, no flags noted. Contract and drug screen are up to date.  I have increased her Norco to  to #100 tabs from #90. Discussed I can not increase any more at this time.  Continue current medications  Health Maintenance reviewed Diet and exercise encouraged  Follow up plan: 3 months    Bari Learn, FNP

## 2024-01-13 NOTE — Patient Instructions (Signed)

## 2024-04-10 ENCOUNTER — Ambulatory Visit: Payer: BC Managed Care – PPO | Admitting: Family

## 2024-04-10 ENCOUNTER — Encounter: Payer: Self-pay | Admitting: Family

## 2024-04-10 VITALS — BP 135/70 | HR 80 | Temp 97.9°F | Ht 63.0 in | Wt 160.8 lb

## 2024-04-10 DIAGNOSIS — G500 Trigeminal neuralgia: Secondary | ICD-10-CM | POA: Diagnosis not present

## 2024-04-10 DIAGNOSIS — M545 Low back pain, unspecified: Secondary | ICD-10-CM

## 2024-04-10 DIAGNOSIS — E7800 Pure hypercholesterolemia, unspecified: Secondary | ICD-10-CM

## 2024-04-10 DIAGNOSIS — M5430 Sciatica, unspecified side: Secondary | ICD-10-CM

## 2024-04-10 DIAGNOSIS — I10 Essential (primary) hypertension: Secondary | ICD-10-CM | POA: Diagnosis not present

## 2024-04-10 DIAGNOSIS — Z720 Tobacco use: Secondary | ICD-10-CM

## 2024-04-10 DIAGNOSIS — G4700 Insomnia, unspecified: Secondary | ICD-10-CM

## 2024-04-10 DIAGNOSIS — G8929 Other chronic pain: Secondary | ICD-10-CM

## 2024-04-10 DIAGNOSIS — F1120 Opioid dependence, uncomplicated: Secondary | ICD-10-CM

## 2024-04-10 DIAGNOSIS — Z79899 Other long term (current) drug therapy: Secondary | ICD-10-CM

## 2024-04-10 DIAGNOSIS — L732 Hidradenitis suppurativa: Secondary | ICD-10-CM

## 2024-04-10 DIAGNOSIS — E559 Vitamin D deficiency, unspecified: Secondary | ICD-10-CM

## 2024-04-10 MED ORDER — HYDROCODONE-ACETAMINOPHEN 10-325 MG PO TABS
1.0000 | ORAL_TABLET | Freq: Four times a day (QID) | ORAL | 0 refills | Status: DC | PRN
Start: 2024-06-05 — End: 2024-07-06

## 2024-04-10 MED ORDER — TRAZODONE HCL 150 MG PO TABS
150.0000 mg | ORAL_TABLET | Freq: Every day | ORAL | 6 refills | Status: AC
Start: 2024-04-10 — End: ?

## 2024-04-10 MED ORDER — CEPHALEXIN 500 MG PO CAPS: 500.0000 mg | ORAL_CAPSULE | Freq: Two times a day (BID) | ORAL | 0 refills | Status: DC

## 2024-04-10 MED ORDER — LOSARTAN POTASSIUM 25 MG PO TABS: 25.0000 mg | ORAL_TABLET | Freq: Every day | ORAL | 2 refills | Status: AC

## 2024-04-10 MED ORDER — AMLODIPINE BESYLATE 5 MG PO TABS: 5.0000 mg | ORAL_TABLET | Freq: Every day | ORAL | 2 refills | Status: AC

## 2024-04-10 MED ORDER — HYDROCODONE-ACETAMINOPHEN 10-325 MG PO TABS
1.0000 | ORAL_TABLET | Freq: Four times a day (QID) | ORAL | 0 refills | Status: DC | PRN
Start: 2024-05-07 — End: 2024-07-06

## 2024-04-10 MED ORDER — HYDROCODONE-ACETAMINOPHEN 10-325 MG PO TABS: 1.0000 | ORAL_TABLET | Freq: Four times a day (QID) | ORAL | 0 refills | Status: DC | PRN

## 2024-04-10 MED ORDER — GABAPENTIN 100 MG PO CAPS: 100.0000 mg | ORAL_CAPSULE | Freq: Three times a day (TID) | ORAL | 3 refills | Status: AC

## 2024-04-10 MED ORDER — ROSUVASTATIN CALCIUM 10 MG PO TABS: 10.0000 mg | ORAL_TABLET | Freq: Every day | ORAL | 1 refills | Status: DC

## 2024-04-10 NOTE — Patient Instructions (Signed)

## 2024-04-10 NOTE — Progress Notes (Signed)
 Subjective:    Patient ID: Grace [name], female    DOB: Apr 21, 1961, 63 y.o.   MRN: [id number]  Chief Complaint  Patient presents with   Medical Management of Chronic Issues   Pt presents to the office today for chronic follow up and pain medication refill.  She has trigeminal neuralgia that comes and goes that can be a 4-10 out 10.    She has hidradenitis and gets abscess in her groin.She has 3-4 active that are swollen and tender on bilateral groin . No discharge.    She fell on 09/10/22 at work and is followed by Ortho right hand. Completed PT. Has had steroid injections every 6 weeks without relief. Back Pain This is a chronic problem. The current episode started more than 1 year ago. The problem occurs intermittently. The problem has been waxing and waning since onset. The pain is present in the lumbar spine. The quality of the pain is described as aching. The pain is at a severity of 1/10 (7 after doing things). The pain is moderate. The symptoms are aggravated by standing. Risk factors include obesity. She has tried analgesics for the symptoms. The treatment provided moderate relief.  Hypertension This is a chronic problem. The current episode started more than 1 year ago. The problem has been resolved since onset. The problem is controlled. Pertinent negatives include no malaise/fatigue, peripheral edema or shortness of breath. Risk factors for coronary artery disease include dyslipidemia and sedentary lifestyle. The current treatment provides moderate improvement.  Nicotine Dependence Presents for follow-up visit. The symptoms have been stable. She smokes 1 pack of cigarettes per day.  Insomnia Primary symptoms: fragmented sleep, sleep disturbance, difficulty falling asleep, frequent awakening, no malaise/fatigue.   The onset quality is gradual. The problem occurs intermittently. Past treatments include meditation. The treatment provided mild relief.  Hyperlipidemia This is a  chronic problem. The current episode started more than 1 year ago. The problem is uncontrolled. Recent lipid tests were reviewed and are high. Pertinent negatives include no shortness of breath. Current antihyperlipidemic treatment includes statins (weekly). The current treatment provides moderate improvement of lipids. Risk factors for coronary artery disease include dyslipidemia, hypertension, a sedentary lifestyle and post-menopausal.   Current opioids rx- Norco 10-325 mg # meds rx- 90 Effectiveness of current meds-Stable  Adverse reactions from pain meds-none Morphine equivalent- 30   Pill count performed-No Last drug screen - today ( high risk q65m, moderate risk q55m, low risk yearly ) Urine drug screen today- Yes Was the NCCSR reviewed- yes              If yes were their any concerning findings? - none     Pain contract signed on: 07/16/23   Review of Systems  Constitutional:  Negative for malaise/fatigue.  Respiratory:  Negative for shortness of breath.   Musculoskeletal:  Positive for back pain.  Psychiatric/Behavioral:  Positive for sleep disturbance.   All other systems reviewed and are negative.   Family History  Problem Relation Age of Onset   Hypertension Mother    Dementia Mother    Alcohol abuse Mother    Cancer Father        lung   Breast cancer Half-Sister    Social History   Socioeconomic History   Marital status: Divorced    Spouse name: Not on file   Number of children: Not on file   Years of education: Not on file   Highest education level: Not on file  Occupational  History   Not on file  Tobacco Use   Smoking status: Every Day    Current packs/day: 1.00    Types: Cigarettes   Smokeless tobacco: Never  Vaping Use   Vaping status: Never Used  Substance and Sexual Activity   Alcohol use: No   Drug use: No   Sexual activity: Not on file  Other Topics Concern   Not on file  Social History Narrative   Not on file   Social Drivers of Health    Financial Resource Strain: Not on file  Food Insecurity: Not on file  Transportation Needs: Not on file  Physical Activity: Not on file  Stress: Not on file  Social Connections: Not on file       Objective:   Physical Exam Vitals reviewed.  Constitutional:      General: She is not in acute distress.    Appearance: She is well-developed.  HENT:     Head: Normocephalic and atraumatic.     Right Ear: Tympanic membrane normal.     Left Ear: Tympanic membrane normal.  Eyes:     Pupils: Pupils are equal, round, and reactive to light.  Neck:     Thyroid: No thyromegaly.  Cardiovascular:     Rate and Rhythm: Normal rate and regular rhythm.     Heart sounds: Normal heart sounds. No murmur heard. Pulmonary:     Effort: Pulmonary effort is normal. No respiratory distress.     Breath sounds: Normal breath sounds. No wheezing.  Abdominal:     General: Bowel sounds are normal. There is no distension.     Palpations: Abdomen is soft.     Tenderness: There is no abdominal tenderness.  Genitourinary:    Comments:   Musculoskeletal:        General: No tenderness. Normal range of motion.     Cervical back: Normal range of motion and neck supple.  Skin:    General: Skin is warm and dry.  Neurological:     Mental Status: She is alert and oriented to person, place, and time.     Cranial Nerves: No cranial nerve deficit.     Deep Tendon Reflexes: Reflexes are normal and symmetric.  Psychiatric:        Behavior: Behavior normal.        Thought Content: Thought content normal.        Judgment: Judgment normal.        BP 135/70   Pulse 80   Temp 97.9 F (36.6 C) (Temporal)   Ht 5\' 3"  (1.6 m)   Wt 160 lb 12.8 oz (72.9 kg)   SpO2 98%   BMI 28.48 kg/m   Assessment & Plan:  Grace [name] comes in today with chief complaint of Medical Management of Chronic Issues   Diagnosis and orders addressed:  1. Primary hypertension (Primary) - amLODipine (NORVASC) 5 MG tablet; Take  1 tablet (5 mg total) by mouth daily.  Dispense: 90 tablet; Refill: 2 - losartan (COZAAR) 25 MG tablet; Take 1 tablet (25 mg total) by mouth daily.  Dispense: 90 tablet; Refill: 2  2. Sciatic leg pain - gabapentin (NEURONTIN) 100 MG capsule; Take 1 capsule (100 mg total) by mouth 3 (three) times daily.  Dispense: 90 capsule; Refill: 3  3. Trigeminal neuralgia - HYDROcodone-acetaminophen (NORCO) 10-325 MG tablet; Take 1 tablet by mouth every 6 (six) hours as needed.  Dispense: 100 tablet; Refill: 0 - HYDROcodone-acetaminophen (NORCO) 10-325 MG tablet; Take 1 tablet  by mouth every 6 (six) hours as needed.  Dispense: 100 tablet; Refill: 0 - HYDROcodone-acetaminophen (NORCO) 10-325 MG tablet; Take 1 tablet by mouth every 6 (six) hours as needed.  Dispense: 100 tablet; Refill: 0  4. Uncomplicated opioid dependence (HCC) - HYDROcodone-acetaminophen (NORCO) 10-325 MG tablet; Take 1 tablet by mouth every 6 (six) hours as needed.  Dispense: 100 tablet; Refill: 0 - HYDROcodone-acetaminophen (NORCO) 10-325 MG tablet; Take 1 tablet by mouth every 6 (six) hours as needed.  Dispense: 100 tablet; Refill: 0 - HYDROcodone-acetaminophen (NORCO) 10-325 MG tablet; Take 1 tablet by mouth every 6 (six) hours as needed.  Dispense: 100 tablet; Refill: 0  5. Controlled substance agreement signed - HYDROcodone-acetaminophen (NORCO) 10-325 MG tablet; Take 1 tablet by mouth every 6 (six) hours as needed.  Dispense: 100 tablet; Refill: 0 - HYDROcodone-acetaminophen (NORCO) 10-325 MG tablet; Take 1 tablet by mouth every 6 (six) hours as needed.  Dispense: 100 tablet; Refill: 0 - HYDROcodone-acetaminophen (NORCO) 10-325 MG tablet; Take 1 tablet by mouth every 6 (six) hours as needed.  Dispense: 100 tablet; Refill: 0  6. Chronic bilateral low back pain, unspecified whether sciatica present - HYDROcodone-acetaminophen (NORCO) 10-325 MG tablet; Take 1 tablet by mouth every 6 (six) hours as needed.  Dispense: 100 tablet; Refill:  0 - HYDROcodone-acetaminophen (NORCO) 10-325 MG tablet; Take 1 tablet by mouth every 6 (six) hours as needed.  Dispense: 100 tablet; Refill: 0 - HYDROcodone-acetaminophen (NORCO) 10-325 MG tablet; Take 1 tablet by mouth every 6 (six) hours as needed.  Dispense: 100 tablet; Refill: 0  7. Chronic low back pain, unspecified back pain laterality, unspecified whether sciatica present - HYDROcodone-acetaminophen (NORCO) 10-325 MG tablet; Take 1 tablet by mouth every 6 (six) hours as needed.  Dispense: 100 tablet; Refill: 0 - HYDROcodone-acetaminophen (NORCO) 10-325 MG tablet; Take 1 tablet by mouth every 6 (six) hours as needed.  Dispense: 100 tablet; Refill: 0 - HYDROcodone-acetaminophen (NORCO) 10-325 MG tablet; Take 1 tablet by mouth every 6 (six) hours as needed.  Dispense: 100 tablet; Refill: 0  8. Pure hypercholesterolemia - rosuvastatin (CRESTOR) 10 MG tablet; Take 1 tablet (10 mg total) by mouth daily.  Dispense: 90 tablet; Refill: 1  9. Insomnia, unspecified type - traZODone (DESYREL) 150 MG tablet; Take 1 tablet (150 mg total) by mouth at bedtime.  Dispense: 90 tablet; Refill: 6  10. Vitamin D deficiency  11. Tobacco user Smoking cessation discussed  12. Hidradenitis Warm compression   Labs pending Patient reviewed in [place] controlled database, no flags noted. Contract and drug screen are up to date.  I have increased her Norco to to #100 tabs from #90. Discussed I can not increase any more at this time.  Continue current medications  Health Maintenance reviewed Diet and exercise encouraged  Follow up plan: 3 months    Jannifer Rodney, FNP

## 2024-06-30 ENCOUNTER — Other Ambulatory Visit: Payer: Self-pay | Admitting: Medical Genetics

## 2024-07-06 ENCOUNTER — Ambulatory Visit: Payer: Self-pay | Admitting: Family

## 2024-07-06 ENCOUNTER — Encounter: Payer: Self-pay | Admitting: Family

## 2024-07-06 VITALS — BP 111/67 | HR 92 | Temp 97.7°F | Ht 63.0 in | Wt 160.0 lb

## 2024-07-06 DIAGNOSIS — L732 Hidradenitis suppurativa: Secondary | ICD-10-CM

## 2024-07-06 DIAGNOSIS — F1120 Opioid dependence, uncomplicated: Secondary | ICD-10-CM

## 2024-07-06 DIAGNOSIS — E7800 Pure hypercholesterolemia, unspecified: Secondary | ICD-10-CM

## 2024-07-06 DIAGNOSIS — G4700 Insomnia, unspecified: Secondary | ICD-10-CM

## 2024-07-06 DIAGNOSIS — Z79899 Other long term (current) drug therapy: Secondary | ICD-10-CM

## 2024-07-06 DIAGNOSIS — G500 Trigeminal neuralgia: Secondary | ICD-10-CM

## 2024-07-06 DIAGNOSIS — E559 Vitamin D deficiency, unspecified: Secondary | ICD-10-CM

## 2024-07-06 DIAGNOSIS — I10 Essential (primary) hypertension: Secondary | ICD-10-CM

## 2024-07-06 DIAGNOSIS — G8929 Other chronic pain: Secondary | ICD-10-CM

## 2024-07-06 DIAGNOSIS — M545 Low back pain, unspecified: Secondary | ICD-10-CM

## 2024-07-06 DIAGNOSIS — Z720 Tobacco use: Secondary | ICD-10-CM

## 2024-07-06 MED ORDER — HYDROCODONE-ACETAMINOPHEN 10-325 MG PO TABS: 1.0000 | ORAL_TABLET | Freq: Four times a day (QID) | ORAL | 0 refills | Status: DC | PRN

## 2024-07-06 MED ORDER — CEPHALEXIN 500 MG PO CAPS: 500.0000 mg | ORAL_CAPSULE | Freq: Two times a day (BID) | ORAL | 0 refills | Status: DC

## 2024-07-06 NOTE — Progress Notes (Signed)
 Subjective:    Patient ID: Grace [name], female    DOB: 12/12/1961, 63 y.o.   MRN: [id number]  Chief Complaint  Patient presents with   Medical Management of Chronic Issues   Pt presents to the office today for chronic follow up and pain medication refill.  She has trigeminal neuralgia that comes and goes that can be a 4-10 out 10.    She has hidradenitis and gets abscess in her groin.She has 2 active that are swollen and tender on bilateral groin . No discharge.    Back Pain This is a chronic problem. The current episode started more than 1 year ago. The problem occurs intermittently. The problem has been waxing and waning since onset. The pain is present in the lumbar spine. The quality of the pain is described as aching. The pain is at a severity of 1/10 (7 after doing things). The pain is moderate. The symptoms are aggravated by standing. Risk factors include obesity. She has tried analgesics for the symptoms. The treatment provided moderate relief.  Hypertension This is a chronic problem. The current episode started more than 1 year ago. The problem has been resolved since onset. The problem is controlled. Pertinent negatives include no malaise/fatigue, peripheral edema or shortness of breath. Risk factors for coronary artery disease include dyslipidemia and sedentary lifestyle. The current treatment provides moderate improvement.  Nicotine Dependence Presents for follow-up visit. The symptoms have been stable. She smokes 1 pack of cigarettes per day.  Insomnia Primary symptoms: fragmented sleep, sleep disturbance, no difficulty falling asleep, frequent awakening, no malaise/fatigue.   The onset quality is gradual. The problem occurs intermittently. Past treatments include meditation. The treatment provided mild relief.  Hyperlipidemia This is a chronic problem. The current episode started more than 1 year ago. The problem is uncontrolled. Recent lipid tests were reviewed and are high.  Pertinent negatives include no shortness of breath. Current antihyperlipidemic treatment includes statins (weekly). The current treatment provides moderate improvement of lipids. Risk factors for coronary artery disease include dyslipidemia, hypertension, a sedentary lifestyle and post-menopausal.   Current opioids rx- Norco 10-325 mg # meds rx- 90 Effectiveness of current meds-Stable  Adverse reactions from pain meds-none Morphine equivalent- 30   Pill count performed-No Last drug screen - 07/06/24 ( high risk q86m, moderate risk q75m, low risk yearly ) Urine drug screen today- Yes Was the NCCSR reviewed- yes              If yes were their any concerning findings? - none     Pain contract signed on: 07/06/24   Review of Systems  Constitutional:  Negative for malaise/fatigue.  Respiratory:  Negative for shortness of breath.   Musculoskeletal:  Positive for back pain.  Psychiatric/Behavioral:  Positive for sleep disturbance.   All other systems reviewed and are negative.   Family History  Problem Relation Age of Onset   Hypertension Mother    Dementia Mother    Alcohol abuse Mother    Cancer Father        lung   Breast cancer Half-Sister    Social History   Socioeconomic History   Marital status: Divorced    Spouse name: Not on file   Number of children: Not on file   Years of education: Not on file   Highest education level: Not on file  Occupational History   Not on file  Tobacco Use   Smoking status: Every Day    Current packs/day: 1.00  Types: Cigarettes   Smokeless tobacco: Never  Vaping Use   Vaping status: Never Used  Substance and Sexual Activity   Alcohol use: No   Drug use: No   Sexual activity: Not on file  Other Topics Concern   Not on file  Social History Narrative   Not on file   Social Drivers of Health   Financial Resource Strain: Not on file  Food Insecurity: Not on file  Transportation Needs: Not on file  Physical Activity: Not on file   Stress: Not on file  Social Connections: Not on file       Objective:   Physical Exam Vitals reviewed.  Constitutional:      General: She is not in acute distress.    Appearance: She is well-developed.  HENT:     Head: Normocephalic and atraumatic.     Right Ear: Tympanic membrane normal.     Left Ear: Tympanic membrane normal.  Eyes:     Pupils: Pupils are equal, round, and reactive to light.  Neck:     Thyroid: No thyromegaly.  Cardiovascular:     Rate and Rhythm: Normal rate and regular rhythm.     Heart sounds: Normal heart sounds. No murmur heard. Pulmonary:     Effort: Pulmonary effort is normal. No respiratory distress.     Breath sounds: Normal breath sounds. No wheezing.  Abdominal:     General: Bowel sounds are normal. There is no distension.     Palpations: Abdomen is soft.     Tenderness: There is no abdominal tenderness.  Genitourinary:    Comments:   Musculoskeletal:        General: No tenderness. Normal range of motion.     Cervical back: Normal range of motion and neck supple.  Skin:    General: Skin is warm and dry.  Neurological:     Mental Status: She is alert and oriented to person, place, and time.     Cranial Nerves: No cranial nerve deficit.     Deep Tendon Reflexes: Reflexes are normal and symmetric.  Psychiatric:        Behavior: Behavior normal.        Thought Content: Thought content normal.        Judgment: Judgment normal.        BP 111/67   Pulse 92   Temp 97.7 F (36.5 C) (Temporal)   Ht 5' 3 (1.6 m)   Wt 160 lb (72.6 kg)   SpO2 98%   BMI 28.34 kg/m   Assessment & Plan:  Grace [name] comes in today with chief complaint of Medical Management of Chronic Issues   Diagnosis and orders addressed:  1. Trigeminal neuralgia (Primary) - HYDROcodone -acetaminophen  (NORCO) 10-325 MG tablet; Take 1 tablet by mouth every 6 (six) hours as needed.  Dispense: 100 tablet; Refill: 0 - HYDROcodone -acetaminophen  (NORCO) 10-325 MG  tablet; Take 1 tablet by mouth every 6 (six) hours as needed.  Dispense: 100 tablet; Refill: 0 - HYDROcodone -acetaminophen  (NORCO) 10-325 MG tablet; Take 1 tablet by mouth every 6 (six) hours as needed.  Dispense: 100 tablet; Refill: 0  2. Pure hypercholesterolemia  3. Primary hypertension  4. Vitamin D  deficiency  5. Tobacco user  6. Hidradenitis Warm compresses  - cephALEXin  (KEFLEX ) 500 MG capsule; Take 1 capsule (500 mg total) by mouth 2 (two) times daily.  Dispense: 14 capsule; Refill: 0  7. Chronic bilateral low back pain, unspecified whether sciatica present - ToxASSURE Select 13 (MW), Urine -  HYDROcodone -acetaminophen  (NORCO) 10-325 MG tablet; Take 1 tablet by mouth every 6 (six) hours as needed.  Dispense: 100 tablet; Refill: 0 - HYDROcodone -acetaminophen  (NORCO) 10-325 MG tablet; Take 1 tablet by mouth every 6 (six) hours as needed.  Dispense: 100 tablet; Refill: 0 - HYDROcodone -acetaminophen  (NORCO) 10-325 MG tablet; Take 1 tablet by mouth every 6 (six) hours as needed.  Dispense: 100 tablet; Refill: 0  8. Controlled substance agreement signed - ToxASSURE Select 13 (MW), Urine - HYDROcodone -acetaminophen  (NORCO) 10-325 MG tablet; Take 1 tablet by mouth every 6 (six) hours as needed.  Dispense: 100 tablet; Refill: 0 - HYDROcodone -acetaminophen  (NORCO) 10-325 MG tablet; Take 1 tablet by mouth every 6 (six) hours as needed.  Dispense: 100 tablet; Refill: 0 - HYDROcodone -acetaminophen  (NORCO) 10-325 MG tablet; Take 1 tablet by mouth every 6 (six) hours as needed.  Dispense: 100 tablet; Refill: 0  9. Uncomplicated opioid dependence (HCC) - ToxASSURE Select 13 (MW), Urine - HYDROcodone -acetaminophen  (NORCO) 10-325 MG tablet; Take 1 tablet by mouth every 6 (six) hours as needed.  Dispense: 100 tablet; Refill: 0 - HYDROcodone -acetaminophen  (NORCO) 10-325 MG tablet; Take 1 tablet by mouth every 6 (six) hours as needed.  Dispense: 100 tablet; Refill: 0 - HYDROcodone -acetaminophen   (NORCO) 10-325 MG tablet; Take 1 tablet by mouth every 6 (six) hours as needed.  Dispense: 100 tablet; Refill: 0  10. Insomnia, unspecified type  11. Chronic low back pain, unspecified back pain laterality, unspecified whether sciatica present - HYDROcodone -acetaminophen  (NORCO) 10-325 MG tablet; Take 1 tablet by mouth every 6 (six) hours as needed.  Dispense: 100 tablet; Refill: 0 - HYDROcodone -acetaminophen  (NORCO) 10-325 MG tablet; Take 1 tablet by mouth every 6 (six) hours as needed.  Dispense: 100 tablet; Refill: 0 - HYDROcodone -acetaminophen  (NORCO) 10-325 MG tablet; Take 1 tablet by mouth every 6 (six) hours as needed.  Dispense: 100 tablet; Refill: 0   Patient reviewed in [place] controlled database, no flags noted. Contract and drug screen are up to date.  Continue current medications  Health Maintenance reviewed Diet and exercise encouraged  Follow up plan: 3 months    Bari Learn, FNP

## 2024-07-06 NOTE — Patient Instructions (Signed)
 Health Maintenance After Age 65 After age 4, you are at a higher risk for certain long-term diseases and infections as well as injuries from falls. Falls are a major cause of broken bones and head injuries in people who are older than age 47. Getting regular preventive care can help to keep you healthy and well. Preventive care includes getting regular testing and making lifestyle changes as recommended by your health care provider. Talk with your health care provider about: Which screenings and tests you should have. A screening is a test that checks for a disease when you have no symptoms. A diet and exercise plan that is right for you. What should I know about screenings and tests to prevent falls? Screening and testing are the best ways to find a health problem early. Early diagnosis and treatment give you the best chance of managing medical conditions that are common after age 37. Certain conditions and lifestyle choices may make you more likely to have a fall. Your health care provider may recommend: Regular vision checks. Poor vision and conditions such as cataracts can make you more likely to have a fall. If you wear glasses, make sure to get your prescription updated if your vision changes. Medicine review. Work with your health care provider to regularly review all of the medicines you are taking, including over-the-counter medicines. Ask your health care provider about any side effects that may make you more likely to have a fall. Tell your health care provider if any medicines that you take make you feel dizzy or sleepy. Strength and balance checks. Your health care provider may recommend certain tests to check your strength and balance while standing, walking, or changing positions. Foot health exam. Foot pain and numbness, as well as not wearing proper footwear, can make you more likely to have a fall. Screenings, including: Osteoporosis screening. Osteoporosis is a condition that causes  the bones to get weaker and break more easily. Blood pressure screening. Blood pressure changes and medicines to control blood pressure can make you feel dizzy. Depression screening. You may be more likely to have a fall if you have a fear of falling, feel depressed, or feel unable to do activities that you used to do. Alcohol use screening. Using too much alcohol can affect your balance and may make you more likely to have a fall. Follow these instructions at home: Lifestyle Do not drink alcohol if: Your health care provider tells you not to drink. If you drink alcohol: Limit how much you have to: 0-1 drink a day for women. 0-2 drinks a day for men. Know how much alcohol is in your drink. In the [place] one drink equals one 12 oz bottle of beer (355 mL), one 5 oz glass of wine (148 mL), or one 1 oz glass of hard liquor (44 mL). Do not use any products that contain nicotine or tobacco. These products include cigarettes, chewing tobacco, and vaping devices, such as e-cigarettes. If you need help quitting, ask your health care provider. Activity  Follow a regular exercise program to stay fit. This will help you maintain your balance. Ask your health care provider what types of exercise are appropriate for you. If you need a cane or walker, use it as recommended by your health care provider. Wear supportive shoes that have nonskid soles. Safety  Remove any tripping hazards, such as rugs, cords, and clutter. Install safety equipment such as grab bars in bathrooms and safety rails on stairs. Keep rooms and walkways  well-lit. General instructions Talk with your health care provider about your risks for falling. Tell your health care provider if: You fall. Be sure to tell your health care provider about all falls, even ones that seem minor. You feel dizzy, tiredness (fatigue), or off-balance. Take over-the-counter and prescription medicines only as told by your health care provider. These include  supplements. Eat a healthy diet and maintain a healthy weight. A healthy diet includes low-fat dairy products, low-fat (lean) meats, and fiber from whole grains, beans, and lots of fruits and vegetables. Stay current with your vaccines. Schedule regular health, dental, and eye exams. Summary Having a healthy lifestyle and getting preventive care can help to protect your health and wellness after age 11. Screening and testing are the best way to find a health problem early and help you avoid having a fall. Early diagnosis and treatment give you the best chance for managing medical conditions that are more common for people who are older than age 28. Falls are a major cause of broken bones and head injuries in people who are older than age 48. Take precautions to prevent a fall at home. Work with your health care provider to learn what changes you can make to improve your health and wellness and to prevent falls. This information is not intended to replace advice given to you by your health care provider. Make sure you discuss any questions you have with your health care provider. Document Revised: 05/08/2021 Document Reviewed: 05/08/2021 Elsevier Patient Education  2024 [employer].

## 2024-07-08 LAB — TOXASSURE SELECT 13 (MW), URINE

## 2024-07-09 ENCOUNTER — Ambulatory Visit: Payer: Self-pay | Admitting: Family

## 2024-10-01 ENCOUNTER — Ambulatory Visit: Payer: Self-pay | Admitting: Family

## 2024-10-01 ENCOUNTER — Encounter: Payer: Self-pay | Admitting: Family

## 2024-10-01 ENCOUNTER — Ambulatory Visit (INDEPENDENT_AMBULATORY_CARE_PROVIDER_SITE_OTHER): Payer: Self-pay | Admitting: Family

## 2024-10-01 VITALS — BP 137/73 | HR 84 | Temp 97.6°F | Ht 63.5 in | Wt 160.2 lb

## 2024-10-01 DIAGNOSIS — M545 Low back pain, unspecified: Secondary | ICD-10-CM | POA: Diagnosis not present

## 2024-10-01 DIAGNOSIS — Z79899 Other long term (current) drug therapy: Secondary | ICD-10-CM

## 2024-10-01 DIAGNOSIS — G500 Trigeminal neuralgia: Secondary | ICD-10-CM | POA: Diagnosis not present

## 2024-10-01 DIAGNOSIS — G8929 Other chronic pain: Secondary | ICD-10-CM

## 2024-10-01 DIAGNOSIS — F1120 Opioid dependence, uncomplicated: Secondary | ICD-10-CM | POA: Diagnosis not present

## 2024-10-01 DIAGNOSIS — Z0001 Encounter for general adult medical examination with abnormal findings: Secondary | ICD-10-CM | POA: Diagnosis not present

## 2024-10-01 DIAGNOSIS — Z0000 Encounter for general adult medical examination without abnormal findings: Secondary | ICD-10-CM

## 2024-10-01 MED ORDER — HYDROCODONE-ACETAMINOPHEN 10-325 MG PO TABS
1.0000 | ORAL_TABLET | Freq: Four times a day (QID) | ORAL | 0 refills | Status: AC | PRN
Start: 2024-10-30 — End: ?

## 2024-10-01 MED ORDER — HYDROCODONE-ACETAMINOPHEN 10-325 MG PO TABS
1.0000 | ORAL_TABLET | Freq: Four times a day (QID) | ORAL | 0 refills | Status: AC | PRN
Start: 2024-11-30 — End: ?

## 2024-10-01 MED ORDER — HYDROCODONE-ACETAMINOPHEN 10-325 MG PO TABS
1.0000 | ORAL_TABLET | Freq: Four times a day (QID) | ORAL | 0 refills | Status: AC | PRN
Start: 2024-10-01 — End: ?

## 2024-10-01 NOTE — Patient Instructions (Signed)
 Health Maintenance After Age 65 After age 27, you are at a higher risk for certain long-term diseases and infections as well as injuries from falls. Falls are a major cause of broken bones and head injuries in people who are older than age 65. Getting regular preventive care can help to keep you healthy and well. Preventive care includes getting regular testing and making lifestyle changes as recommended by your health care provider. Talk with your health care provider about: Which screenings and tests you should have. A screening is a test that checks for a disease when you have no symptoms. A diet and exercise plan that is right for you. What should I know about screenings and tests to prevent falls? Screening and testing are the best ways to find a health problem early. Early diagnosis and treatment give you the best chance of managing medical conditions that are common after age 65. Certain conditions and lifestyle choices may make you more likely to have a fall. Your health care provider may recommend: Regular vision checks. Poor vision and conditions such as cataracts can make you more likely to have a fall. If you wear glasses, make sure to get your prescription updated if your vision changes. Medicine review. Work with your health care provider to regularly review all of the medicines you are taking, including over-the-counter medicines. Ask your health care provider about any side effects that may make you more likely to have a fall. Tell your health care provider if any medicines that you take make you feel dizzy or sleepy. Strength and balance checks. Your health care provider may recommend certain tests to check your strength and balance while standing, walking, or changing positions. Foot health exam. Foot pain and numbness, as well as not wearing proper footwear, can make you more likely to have a fall. Screenings, including: Osteoporosis screening. Osteoporosis is a condition that causes  the bones to get weaker and break more easily. Blood pressure screening. Blood pressure changes and medicines to control blood pressure can make you feel dizzy. Depression screening. You may be more likely to have a fall if you have a fear of falling, feel depressed, or feel unable to do activities that you used to do. Alcohol  use screening. Using too much alcohol  can affect your balance and may make you more likely to have a fall. Follow these instructions at home: Lifestyle Do not drink alcohol  if: Your health care provider tells you not to drink. If you drink alcohol : Limit how much you have to: 0-1 drink a day for women. 0-2 drinks a day for men. Know how much alcohol  is in your drink. In the [place] one drink equals one 12 oz bottle of beer (355 mL), one 5 oz glass of wine (148 mL), or one 1 oz glass of hard liquor (44 mL). Do not use any products that contain nicotine or tobacco. These products include cigarettes, chewing tobacco, and vaping devices, such as e-cigarettes. If you need help quitting, ask your health care provider. Activity  Follow a regular exercise program to stay fit. This will help you maintain your balance. Ask your health care provider what types of exercise are appropriate for you. If you need a cane or walker, use it as recommended by your health care provider. Wear supportive shoes that have nonskid soles. Safety  Remove any tripping hazards, such as rugs, cords, and clutter. Install safety equipment such as grab bars in bathrooms and safety rails on stairs. Keep rooms and walkways  well-lit. General instructions Talk with your health care provider about your risks for falling. Tell your health care provider if: You fall. Be sure to tell your health care provider about all falls, even ones that seem minor. You feel dizzy, tiredness (fatigue), or off-balance. Take over-the-counter and prescription medicines only as told by your health care provider. These include  supplements. Eat a healthy diet and maintain a healthy weight. A healthy diet includes low-fat dairy products, low-fat (lean) meats, and fiber from whole grains, beans, and lots of fruits and vegetables. Stay current with your vaccines. Schedule regular health, dental, and eye exams. Summary Having a healthy lifestyle and getting preventive care can help to protect your health and wellness after age 65. Screening and testing are the best way to find a health problem early and help you avoid having a fall. Early diagnosis and treatment give you the best chance for managing medical conditions that are more common for people who are older than age 65. Falls are a major cause of broken bones and head injuries in people who are older than age 65. Take precautions to prevent a fall at home. Work with your health care provider to learn what changes you can make to improve your health and wellness and to prevent falls. This information is not intended to replace advice given to you by your health care provider. Make sure you discuss any questions you have with your health care provider. Document Revised: 05/08/2021 Document Reviewed: 05/08/2021 Elsevier Patient Education  2024 [employer].

## 2024-10-01 NOTE — Progress Notes (Signed)
 Subjective:    Grace [name] is a 63 y.o. female who presents for a Welcome to Medicare exam.   Cardiac Risk Factors include: advanced age (>41men, >19 women)      Objective:    Today's Vitals   10/01/24 1106  BP: 137/73  Pulse: 84  Temp: 97.6 F (36.4 C)  TempSrc: Temporal  SpO2: 98%  Weight: 160 lb 3.2 oz (72.7 kg)  Height: 5' 3 (1.6 m)  PainSc: 3   Body mass index is 28.38 kg/m.  Medications Outpatient Encounter Medications as of 10/01/2024  Medication Sig   amLODipine  (NORVASC ) 5 MG tablet Take 1 tablet (5 mg total) by mouth daily.   aspirin EC 81 MG tablet Take 81 mg by mouth daily.   cephALEXin  (KEFLEX ) 500 MG capsule Take 1 capsule (500 mg total) by mouth 2 (two) times daily.   gabapentin  (NEURONTIN ) 100 MG capsule Take 1 capsule (100 mg total) by mouth 3 (three) times daily.   losartan  (COZAAR ) 25 MG tablet Take 1 tablet (25 mg total) by mouth daily.   Multiple Vitamin (MULTIVITAMIN) capsule Take 1 capsule by mouth daily.   rosuvastatin  (CRESTOR ) 10 MG tablet Take 1 tablet (10 mg total) by mouth daily.   traZODone  (DESYREL ) 150 MG tablet Take 1 tablet (150 mg total) by mouth at bedtime.   [DISCONTINUED] Vitamin D , Ergocalciferol , (DRISDOL ) 1.25 MG (50000 UNIT) CAPS capsule Take 1 capsule (50,000 Units total) by mouth every 7 (seven) days.   HYDROcodone -acetaminophen  (NORCO) 10-325 MG tablet Take 1 tablet by mouth every 6 (six) hours as needed.   HYDROcodone -acetaminophen  (NORCO) 10-325 MG tablet Take 1 tablet by mouth every 6 (six) hours as needed.   HYDROcodone -acetaminophen  (NORCO) 10-325 MG tablet Take 1 tablet by mouth every 6 (six) hours as needed.   No facility-administered encounter medications on file as of 10/01/2024.     History: Past Medical History:  Diagnosis Date   Hidradenitis    Hyperlipidemia    Hypertension    Tobacco user    Trigeminal neuralgia    Vitamin D  deficiency    Past Surgical History:  Procedure Laterality Date    CHOLECYSTECTOMY     DILATION AND CURETTAGE OF UTERUS      Family History  Problem Relation Age of Onset   Hypertension Mother    Dementia Mother    Alcohol abuse Mother    Cancer Father        lung   Breast cancer Half-Sister    Social History   Occupational History   Not on file  Tobacco Use   Smoking status: Every Day    Current packs/day: 1.00    Types: Cigarettes   Smokeless tobacco: Never  Vaping Use   Vaping status: Never Used  Substance and Sexual Activity   Alcohol use: No   Drug use: No   Sexual activity: Not on file    Tobacco Counseling Ready to quit: Not Answered Counseling given: Not Answered   Immunizations and Health Maintenance Immunization History  Administered Date(s) Administered   Influenza Split 10/16/2017   Influenza, Quadrivalent, Recombinant, Inj, Pf 10/22/2019   Influenza, Seasonal, Injecte, Preservative Fre 10/22/2022, 10/11/2023   Influenza,inj,Quad PF,6+ Mos 10/23/2013, 10/11/2015, 11/15/2020, 11/10/2021, 10/22/2022   Influenza-Unspecified 11/02/2016   PFIZER Comirnaty(Gray Top)Covid-19 Tri-Sucrose Vaccine 12/29/2022   PFIZER(Purple Top)SARS-COV-2 Vaccination 03/21/2020, 04/11/2020, 11/02/2020, 10/19/2021   RSV,unspecified 08/15/2023   Tdap 11/14/2016   Health Maintenance Due  Topic Date Due   DEXA SCAN  Never done  Activities of Daily Living    10/01/2024   11:08 AM  In your present state of health, do you have any difficulty performing the following activities:  Hearing? 0  Vision? 0  Difficulty concentrating or making decisions? 0  Walking or climbing stairs? 0  Dressing or bathing? 0  Doing errands, shopping? 0  Preparing Food and eating ? N  Using the Toilet? N  In the past six months, have you accidently leaked urine? N  Do you have problems with loss of bowel control? Y  Comment IBS  Managing your Medications? N  Managing your Finances? N  Housekeeping or managing your Housekeeping? N    Physical Exam    Physical Exam (optional), or other factors deemed appropriate based on the beneficiary's medical and social history and current clinical standards.   Advanced Directives: Does Patient Have a Medical Advance Directive?: No Would patient like information on creating a medical advance directive?: No - Guardian declined  EKG:  normal EKG, normal sinus rhythm      Assessment:    This is a routine wellness examination for this patient pain medication refill.  She has trigeminal neuralgia that comes and goes that can be a 4-10 out 10.    She has chronic back pain in lumbar of aching 2 out 10 that can be 7 when sitting for long periods of time and standing.    Current opioids rx- Norco 10-325 mg # meds rx- 90 Effectiveness of current meds-Stable  Adverse reactions from pain meds-none Morphine equivalent- 30   Pill count performed-No Last drug screen - today ( high risk q55m, moderate risk q1m, low risk yearly ) Urine drug screen today- Yes Was the [facility] reviewed- yes              If yes were their any concerning findings? - none     Pain contract signed on: 07/08/24   Vision/Hearing screen No results found.   Goals      DIET - INCREASE WATER INTAKE        Depression Screen    10/01/2024   11:16 AM 10/01/2024   11:13 AM 07/06/2024   11:26 AM 10/11/2023   10:19 AM  PHQ 2/9 Scores  PHQ - 2 Score 1 1 2  0  PHQ- 9 Score 4  7 4      Fall Risk    10/01/2024   11:14 AM  Fall Risk   Falls in the past year? 0  Number falls in past yr: 0  Injury with Fall? 0  Risk for fall due to : No Fall Risks  Follow up Falls evaluation completed    Cognitive Function:        10/01/2024   11:15 AM  6CIT Screen  What Year? 0 points  What month? 0 points  What time? 0 points  Count back from 20 0 points  Months in reverse 0 points  Repeat phrase 0 points  Total Score 0 points    Patient Care Team: Lavell Bari LABOR, FNP as PCP - General (Family Medicine)     Plan:      I have personally reviewed and noted the following in the patient's chart:   Medical and social history Use of alcohol, tobacco or illicit drugs  Current medications and supplements including opioid prescriptions. Patient is currently taking opioid prescriptions. Information provided to patient regarding non-opioid alternatives. Patient advised to discuss non-opioid treatment plan with their provider. Functional ability and status Nutritional status Physical  activity Advanced directives List of other physicians Hospitalizations, surgeries, and ER visits in previous 12 months Vitals Screenings to include cognitive, depression, and falls Referrals and appointments  In addition, I have reviewed and discussed with patient certain preventive protocols, quality metrics, and best practice recommendations. A written personalized care plan for preventive services as well as general preventive health recommendations were provided to patient.   1. Trigeminal neuralgia - HYDROcodone -acetaminophen  (NORCO) 10-325 MG tablet; Take 1 tablet by mouth every 6 (six) hours as needed.  Dispense: 100 tablet; Refill: 0 - HYDROcodone -acetaminophen  (NORCO) 10-325 MG tablet; Take 1 tablet by mouth every 6 (six) hours as needed.  Dispense: 100 tablet; Refill: 0 - HYDROcodone -acetaminophen  (NORCO) 10-325 MG tablet; Take 1 tablet by mouth every 6 (six) hours as needed.  Dispense: 100 tablet; Refill: 0  2. Chronic bilateral low back pain, unspecified whether sciatica present - HYDROcodone -acetaminophen  (NORCO) 10-325 MG tablet; Take 1 tablet by mouth every 6 (six) hours as needed.  Dispense: 100 tablet; Refill: 0 - HYDROcodone -acetaminophen  (NORCO) 10-325 MG tablet; Take 1 tablet by mouth every 6 (six) hours as needed.  Dispense: 100 tablet; Refill: 0 - HYDROcodone -acetaminophen  (NORCO) 10-325 MG tablet; Take 1 tablet by mouth every 6 (six) hours as needed.  Dispense: 100 tablet; Refill: 0  3. Controlled substance  agreement signed - HYDROcodone -acetaminophen  (NORCO) 10-325 MG tablet; Take 1 tablet by mouth every 6 (six) hours as needed.  Dispense: 100 tablet; Refill: 0 - HYDROcodone -acetaminophen  (NORCO) 10-325 MG tablet; Take 1 tablet by mouth every 6 (six) hours as needed.  Dispense: 100 tablet; Refill: 0 - HYDROcodone -acetaminophen  (NORCO) 10-325 MG tablet; Take 1 tablet by mouth every 6 (six) hours as needed.  Dispense: 100 tablet; Refill: 0  4. Uncomplicated opioid dependence (HCC) - HYDROcodone -acetaminophen  (NORCO) 10-325 MG tablet; Take 1 tablet by mouth every 6 (six) hours as needed.  Dispense: 100 tablet; Refill: 0 - HYDROcodone -acetaminophen  (NORCO) 10-325 MG tablet; Take 1 tablet by mouth every 6 (six) hours as needed.  Dispense: 100 tablet; Refill: 0 - HYDROcodone -acetaminophen  (NORCO) 10-325 MG tablet; Take 1 tablet by mouth every 6 (six) hours as needed.  Dispense: 100 tablet; Refill: 0  5. Chronic low back pain, unspecified back pain laterality, unspecified whether sciatica present - HYDROcodone -acetaminophen  (NORCO) 10-325 MG tablet; Take 1 tablet by mouth every 6 (six) hours as needed.  Dispense: 100 tablet; Refill: 0 - HYDROcodone -acetaminophen  (NORCO) 10-325 MG tablet; Take 1 tablet by mouth every 6 (six) hours as needed.  Dispense: 100 tablet; Refill: 0 - HYDROcodone -acetaminophen  (NORCO) 10-325 MG tablet; Take 1 tablet by mouth every 6 (six) hours as needed.  Dispense: 100 tablet; Refill: 0  6. Medicare annual wellness visit, initial (Primary) - EKG 12-Lead  7. Encounter for Medicare annual wellness exam    Bari Learn, FNP 10/01/2024

## 2024-10-06 ENCOUNTER — Ambulatory Visit: Payer: Self-pay | Admitting: Family

## 2024-10-13 ENCOUNTER — Other Ambulatory Visit: Payer: Self-pay

## 2024-10-13 ENCOUNTER — Encounter (HOSPITAL_BASED_OUTPATIENT_CLINIC_OR_DEPARTMENT_OTHER): Payer: Self-pay | Admitting: Orthopedic Surgery

## 2024-10-21 ENCOUNTER — Encounter (HOSPITAL_BASED_OUTPATIENT_CLINIC_OR_DEPARTMENT_OTHER): Payer: Self-pay | Admitting: Anesthesiology

## 2024-10-21 ENCOUNTER — Encounter (HOSPITAL_BASED_OUTPATIENT_CLINIC_OR_DEPARTMENT_OTHER): Payer: Self-pay | Admitting: Orthopedic Surgery

## 2024-10-21 ENCOUNTER — Encounter: Payer: Self-pay | Admitting: *Deleted

## 2024-10-21 ENCOUNTER — Ambulatory Visit (HOSPITAL_BASED_OUTPATIENT_CLINIC_OR_DEPARTMENT_OTHER): Payer: Self-pay | Admitting: Anesthesiology

## 2024-10-21 ENCOUNTER — Encounter (HOSPITAL_BASED_OUTPATIENT_CLINIC_OR_DEPARTMENT_OTHER)
Admission: RE | Disposition: A | Discharge status: Home / Self Care | Payer: Self-pay | Source: Home / Self Care | Attending: Orthopedic Surgery

## 2024-10-21 ENCOUNTER — Other Ambulatory Visit: Payer: Self-pay

## 2024-10-21 ENCOUNTER — Ambulatory Visit (HOSPITAL_BASED_OUTPATIENT_CLINIC_OR_DEPARTMENT_OTHER)
Admission: RE | Admit: 2024-10-21 | Discharge: 2024-10-21 | Disposition: A | Discharge status: Home / Self Care | Payer: Self-pay | Attending: Orthopedic Surgery | Admitting: Orthopedic Surgery

## 2024-10-21 DIAGNOSIS — M65311 Trigger thumb, right thumb: Secondary | ICD-10-CM | POA: Insufficient documentation

## 2024-10-21 DIAGNOSIS — G709 Myoneural disorder, unspecified: Secondary | ICD-10-CM | POA: Diagnosis not present

## 2024-10-21 DIAGNOSIS — Z79899 Other long term (current) drug therapy: Secondary | ICD-10-CM | POA: Insufficient documentation

## 2024-10-21 DIAGNOSIS — Z8249 Family history of ischemic heart disease and other diseases of the circulatory system: Secondary | ICD-10-CM | POA: Insufficient documentation

## 2024-10-21 DIAGNOSIS — F17210 Nicotine dependence, cigarettes, uncomplicated: Secondary | ICD-10-CM | POA: Insufficient documentation

## 2024-10-21 DIAGNOSIS — I10 Essential (primary) hypertension: Secondary | ICD-10-CM | POA: Insufficient documentation

## 2024-10-21 DIAGNOSIS — K219 Gastro-esophageal reflux disease without esophagitis: Secondary | ICD-10-CM | POA: Diagnosis not present

## 2024-10-21 DIAGNOSIS — J449 Chronic obstructive pulmonary disease, unspecified: Secondary | ICD-10-CM

## 2024-10-21 DIAGNOSIS — M65841 Other synovitis and tenosynovitis, right hand: Secondary | ICD-10-CM | POA: Diagnosis not present

## 2024-10-21 DIAGNOSIS — Z01818 Encounter for other preprocedural examination: Secondary | ICD-10-CM

## 2024-10-21 DIAGNOSIS — Z604 Social exclusion and rejection: Secondary | ICD-10-CM | POA: Insufficient documentation

## 2024-10-21 DIAGNOSIS — F17200 Nicotine dependence, unspecified, uncomplicated: Secondary | ICD-10-CM

## 2024-10-21 HISTORY — DX: Gastro-esophageal reflux disease without esophagitis: K21.9

## 2024-10-21 HISTORY — DX: Chronic obstructive pulmonary disease, unspecified: J44.9

## 2024-10-21 HISTORY — PX: TRIGGER FINGER RELEASE: SHX641

## 2024-10-21 SURGERY — RELEASE, A1 PULLEY, FOR TRIGGER FINGER
Anesthesia: Monitor Anesthesia Care | Site: Thumb | Laterality: Right

## 2024-10-21 MED ORDER — PROPOFOL 10 MG/ML IV BOLUS
INTRAVENOUS | Status: AC
  Filled 2024-10-21: qty 20

## 2024-10-21 MED ORDER — LIDOCAINE 2% (20 MG/ML) 5 ML SYRINGE
INTRAMUSCULAR | Status: AC
Start: 2024-10-21 — End: 2024-10-21
  Filled 2024-10-21: qty 5

## 2024-10-21 MED ORDER — AMISULPRIDE (ANTIEMETIC) 5 MG/2ML IV SOLN: 10.0000 mg | Freq: Once | INTRAVENOUS | Status: DC | PRN

## 2024-10-21 MED ORDER — MIDAZOLAM HCL 2 MG/2ML IJ SOLN
INTRAMUSCULAR | Status: AC
  Filled 2024-10-21: qty 2

## 2024-10-21 MED ORDER — PROPOFOL 500 MG/50ML IV EMUL
INTRAVENOUS | Status: DC | PRN
  Administered 2024-10-21: 200 ug/kg/min via INTRAVENOUS

## 2024-10-21 MED ORDER — ONDANSETRON HCL 4 MG/2ML IJ SOLN
INTRAMUSCULAR | Status: DC | PRN
  Administered 2024-10-21: 4 mg via INTRAVENOUS

## 2024-10-21 MED ORDER — OXYCODONE HCL 5 MG PO TABS: 5.0000 mg | ORAL_TABLET | Freq: Once | ORAL | Status: DC | PRN

## 2024-10-21 MED ORDER — FENTANYL CITRATE (PF) 100 MCG/2ML IJ SOLN
INTRAMUSCULAR | Status: AC
  Filled 2024-10-21: qty 2

## 2024-10-21 MED ORDER — FENTANYL CITRATE (PF) 100 MCG/2ML IJ SOLN
INTRAMUSCULAR | Status: DC | PRN
  Administered 2024-10-21: 50 ug via INTRAVENOUS

## 2024-10-21 MED ORDER — LACTATED RINGERS IV SOLN: INTRAVENOUS | Status: DC

## 2024-10-21 MED ORDER — ONDANSETRON HCL 4 MG/2ML IJ SOLN
INTRAMUSCULAR | Status: AC
  Filled 2024-10-21: qty 2

## 2024-10-21 MED ORDER — MIDAZOLAM HCL 5 MG/5ML IJ SOLN
INTRAMUSCULAR | Status: DC | PRN
  Administered 2024-10-21: 2 mg via INTRAVENOUS

## 2024-10-21 MED ORDER — BUPIVACAINE HCL 0.25 % IJ SOLN
INTRAMUSCULAR | Status: DC | PRN
  Administered 2024-10-21: 8 mL

## 2024-10-21 MED ORDER — 0.9 % SODIUM CHLORIDE (POUR BTL) OPTIME
TOPICAL | Status: DC | PRN
  Administered 2024-10-21: 1000 mL

## 2024-10-21 MED ORDER — FENTANYL CITRATE (PF) 100 MCG/2ML IJ SOLN: 25.0000 ug | INTRAMUSCULAR | Status: DC | PRN

## 2024-10-21 MED ORDER — CEFAZOLIN SODIUM-DEXTROSE 2-4 GM/100ML-% IV SOLN
2.0000 g | INTRAVENOUS | Status: AC
  Administered 2024-10-21: 2 g via INTRAVENOUS

## 2024-10-21 MED ORDER — LACTATED RINGERS IV SOLN: INTRAVENOUS | Status: DC | PRN

## 2024-10-21 MED ORDER — OXYCODONE HCL 5 MG/5ML PO SOLN: 5.0000 mg | Freq: Once | ORAL | Status: DC | PRN

## 2024-10-21 MED ORDER — CEFAZOLIN SODIUM-DEXTROSE 2-4 GM/100ML-% IV SOLN
INTRAVENOUS | Status: AC
  Filled 2024-10-21: qty 100

## 2024-10-21 SURGICAL SUPPLY — 27 items
BLADE SURG 15 STRL LF DISP TIS (BLADE) ×1 IMPLANT
BNDG COMPR ESMARK 4X3 LF (GAUZE/BANDAGES/DRESSINGS) ×1 IMPLANT
BNDG ELASTIC 2INX 5YD STR LF (GAUZE/BANDAGES/DRESSINGS) ×1 IMPLANT
CHLORAPREP W/TINT 26 (MISCELLANEOUS) ×1 IMPLANT
CORD BIPOLAR FORCEPS 12FT (ELECTRODE) ×1 IMPLANT
COVER BACK TABLE 60X90IN (DRAPES) ×1 IMPLANT
CUFF TOURN SGL QUICK 18X4 (TOURNIQUET CUFF) IMPLANT
CUFF TRNQT CYL 24X4X16.5-23 (TOURNIQUET CUFF) IMPLANT
DRAPE EXTREMITY T 121X128X90 (DISPOSABLE) ×1 IMPLANT
DRAPE SURG 17X23 STRL (DRAPES) ×1 IMPLANT
GAUZE SPONGE 4X4 12PLY STRL (GAUZE/BANDAGES/DRESSINGS) IMPLANT
GAUZE XEROFORM 1X8 LF (GAUZE/BANDAGES/DRESSINGS) ×1 IMPLANT
GLOVE BIO SURGEON STRL SZ7 (GLOVE) ×1 IMPLANT
GLOVE BIOGEL PI IND STRL 7.0 (GLOVE) ×1 IMPLANT
GOWN STRL REUS W/ TWL LRG LVL3 (GOWN DISPOSABLE) ×2 IMPLANT
NDL HYPO 25X1 1.5 SAFETY (NEEDLE) ×1 IMPLANT
NEEDLE HYPO 25X1 1.5 SAFETY (NEEDLE) ×1 IMPLANT
NS IRRIG 1000ML POUR BTL (IV SOLUTION) ×1 IMPLANT
PACK BASIN DAY SURGERY FS (CUSTOM PROCEDURE TRAY) ×1 IMPLANT
SHEET MEDIUM DRAPE 40X70 STRL (DRAPES) ×1 IMPLANT
SUT ETHILON 4 0 PS 2 18 (SUTURE) ×1 IMPLANT
SUT VIC AB 4-0 PS2 18 (SUTURE) IMPLANT
SUT VICRYL RAPIDE 4/0 PS 2 (SUTURE) IMPLANT
SYR BULB EAR ULCER 3OZ GRN STR (SYRINGE) ×1 IMPLANT
SYR CONTROL 10ML LL (SYRINGE) ×1 IMPLANT
TOWEL GREEN STERILE FF (TOWEL DISPOSABLE) ×1 IMPLANT
UNDERPAD 30X36 HEAVY ABSORB (UNDERPADS AND DIAPERS) ×1 IMPLANT

## 2024-10-21 NOTE — Anesthesia Preprocedure Evaluation (Signed)
 Anesthesia Evaluation  Patient identified by MRN, date of birth, ID band Patient awake    Reviewed: Allergy & Precautions, NPO status , Patient's Chart, lab work & pertinent test results  Airway Mallampati: II  TM Distance: >3 FB Neck ROM: Full    Dental  (+) Dental Advisory Given   Pulmonary COPD, Current Smoker and Patient abstained from smoking.   breath sounds clear to auscultation       Cardiovascular hypertension, Pt. on medications  Rhythm:Regular Rate:Normal     Neuro/Psych  Neuromuscular disease    GI/Hepatic Neg liver ROS,GERD  ,,  Endo/Other  negative endocrine ROS    Renal/GU negative Renal ROS     Musculoskeletal   Abdominal   Peds  Hematology negative hematology ROS (+)   Anesthesia Other Findings   Reproductive/Obstetrics                              Anesthesia Physical Anesthesia Plan  ASA: 3  Anesthesia Plan: MAC   Post-op Pain Management: Ofirmev  IV (intra-op)* and Toradol IV (intra-op)*   Induction: Intravenous  PONV Risk Score and Plan: 1 and Ondansetron, Propofol infusion and Treatment may vary due to age or medical condition  Airway Management Planned: Natural Airway and Simple Face Mask  Additional Equipment:   Intra-op Plan:   Post-operative Plan:   Informed Consent: I have reviewed the patients History and Physical, chart, labs and discussed the procedure including the risks, benefits and alternatives for the proposed anesthesia with the patient or authorized representative who has indicated his/her understanding and acceptance.       Plan Discussed with: CRNA  Anesthesia Plan Comments:         Anesthesia Quick Evaluation

## 2024-10-21 NOTE — Op Note (Signed)
   Date of Surgery: 10/21/2024  INDICATIONS: Patient is a 63 y.o.-year-old female with right thumb stenosing tenosynovitis that has failed multiple corticosteroid injections.  Risks, benefits, and alternatives to surgery were again discussed with the patient in the preoperative area. The patient wishes to proceed with surgery.  Informed consent was signed after our discussion.   PREOPERATIVE DIAGNOSIS:  Right thumb stenosing tenosynovitis  POSTOPERATIVE DIAGNOSIS: Same.  PROCEDURE:  Right thumb A1 pulley release   SURGEON: Carlin Galla, M.D.  ASSIST:   ANESTHESIA:  Local + MAC  IV FLUIDS AND URINE: See anesthesia.  ESTIMATED BLOOD LOSS: <5 mL.  IMPLANTS: * No implants in log *   DRAINS: None  COMPLICATIONS: None noted  DESCRIPTION OF PROCEDURE:   The patient was met in the preoperative holding area where the surgical site was marked and the informed consent form was signed.  The patient was then brought back to the operating room and remained on the stretcher.  A hand table was placed adjacent to the operative extremity and locked into place.  A tourniquet was placed on the right forearm.  A formal timeout was performed to confirm that this was the correct patient, surgical side, surgical site, and surgical procedure.  All were present and in agreement. Following formal timeout, a local block was performed using 10 mL of 0.25% plain marcaine.  The right upper extremity was then prepped and draped in the usual and sterile fashion.    Following a second formal timeout, the limb was exsanguinated and the tourniquet inflated to 250 mmHg.  A transverse incision was made over the A1 pulley.  The skin was incised.  Blunt dissection was used to identify the A1 pulley.  Two Ragnell retractors were placed on the radial and ulnar sides of the pulley to protect the respective neurovascular bundles.  A third Ragnell was placed at the distal aspect of the wound.  The A1 pulley was clearly  identified.  Under direct visualization, the pulley was entered sharply using a 15 blade.  Tenotomy scissors were used to complete the pulley release distally to the level of the A2 pulley.  The distal retractor was then placed in the proximal aspect of the wound.  Under direct visualization, the proximal aspect of the A1 pulley was completely released.   The wound was then thoroughly irrigated.  It was closed using 4-0 vicryl rapide sutures in a horizontal mattress fashion.  The wound was dressed with xeroform, 4x4, and an ace wrap.  The tourniquet was deflated.  The finger tips were pink and well perfused.   The patient was reversed from sedation.  All counts were correct x 2 at the end of the procedure.  The patient was then taken to the PACU in stable condition.   POSTOPERATIVE PLAN: She will be discharged to home with appropriate pain medication and discharge instructions.  I'll see her back in the office in 10-14 days for her first postop visit.   Carlin Galla, MD 9:13 AM

## 2024-10-21 NOTE — Anesthesia Postprocedure Evaluation (Signed)
 Anesthesia Post Note  Patient: Grace [name]  Procedure(s) Performed: RIGHT THUMB A1 PULLEY RELEASE (Right: Thumb)     Patient location during evaluation: PACU Anesthesia Type: MAC Level of consciousness: awake and alert Pain management: pain level controlled Vital Signs Assessment: post-procedure vital signs reviewed and stable Respiratory status: spontaneous breathing, nonlabored ventilation, respiratory function stable and patient connected to nasal cannula oxygen Cardiovascular status: stable and blood pressure returned to baseline Postop Assessment: no apparent nausea or vomiting Anesthetic complications: no   No notable events documented.  Last Vitals:  Vitals:   10/21/24 0945 10/21/24 0952  BP:  (!) 102/51  Pulse: 64 70  Resp: 13 16  Temp:  (!) 36.3 C  SpO2: 97% 96%    Last Pain:  Vitals:   10/21/24 0952  TempSrc:   PainSc: 0-No pain                 Epifanio Lamar BRAVO

## 2024-10-21 NOTE — Discharge Instructions (Signed)
 Waylan Rocher, M.D. Hand Surgery  POST-OPERATIVE DISCHARGE INSTRUCTIONS   PRESCRIPTIONS: - You may have been given a prescription to be taken as directed for post-operative pain control.  You may also take over the counter ibuprofen/aleve and tylenol for pain. Take this as directed on the packaging. Do not exceed 3000 mg tylenol/acetaminophen in 24 hours.  Ibuprofen 600-800 mg (3-4) tablets by mouth every 6 hours as needed for pain.   OR  Aleve 2 tablets by mouth every 12 hours (twice daily) as needed for pain.   AND/OR  Tylenol 1000 mg (2 tablets) every 8 hours as needed for pain.  - Please use your pain medication carefully, as refills are limited and you may not be provided with one.  As stated above, please use over the counter pain medicine - it will also be helpful with decreasing your swelling.    ANESTHESIA: -After your surgery, post-surgical discomfort or pain is likely. This discomfort can last several days to a few weeks. At certain times of the day your discomfort may be more intense.   Did you receive a nerve block?   - A nerve block can provide pain relief for one hour to two days after your surgery. As long as the nerve block is working, you will experience little or no sensation in the area the surgeon operated on.  - As the nerve block wears off, you will begin to experience pain or discomfort. It is very important that you begin taking your prescribed pain medication before the nerve block fully wears off. Treating your pain at the first sign of the block wearing off will ensure your pain is better controlled and more tolerable when full-sensation returns. Do not wait until the pain is intolerable, as the medicine will be less effective. It is better to treat pain in advance than to try and catch up.   General Anesthesia:  If you did not receive a nerve block during your surgery, you will need to start taking your pain medication shortly after your surgery and  should continue to do so as prescribed by your surgeon.     ICE AND ELEVATION: - You may use ice for the first 48-72 hours, but it is not critical.   - Motion of your fingers is very important to decrease the swelling.  - Elevation, as much as possible for the next 48 hours, is critical for decreasing swelling as well as for pain relief. Elevation means when you are seated or lying down, you hand should be at or above your heart. When walking, the hand needs to be at or above the level of your elbow.  - If the bandage gets too tight, it may need to be loosened. Please contact our office and we will instruct you in how to do this.    SURGICAL BANDAGES:  - Keep your dressing and/or splint clean and dry at all times.  You can remove your dressing 7 days from now and change with a dry dressing or Band-Aids as needed thereafter. - You may place a plastic bag over your bandage to shower, but be careful, do not get your bandages wet.  - After the bandages have been removed, it is OK to get the stitches wet in a shower or with hand washing. Do Not soak or submerge the wound yet. Please do not use lotions or creams on the stitches.      HAND THERAPY:  - You may not need any. If you  do, we will begin this at your follow up visit in the clinic.    ACTIVITY AND WORK: - You are encouraged to move any fingers which are not in the bandage.  - Light use of the fingers is allowed to assist the other hand with daily hygiene and eating, but strong gripping or lifting is often uncomfortable and should be avoided.  - You might miss a variable period of time from work and hopefully this issue has been discussed prior to surgery. You may not do any heavy work with your affected hand for about 2 weeks.    [facility] Second Floor, [address] [place], [place] [address] [phone]

## 2024-10-21 NOTE — Interval H&P Note (Signed)
 History and Physical Interval Note:  10/21/2024 7:39 AM  Grace [name]  has presented today for surgery, with the diagnosis of Trigger thumb, right thumb.  The various methods of treatment have been discussed with the patient and family. After consideration of risks, benefits and other options for treatment, the patient has consented to  Procedure(s): RELEASE, A1 PULLEY, FOR TRIGGER FINGER (Right) as a surgical intervention.  The patient's history has been reviewed, patient examined, no change in status, stable for surgery.  I have reviewed the patient's chart and labs.  Questions were answered to the patient's satisfaction.     [name]

## 2024-10-21 NOTE — Transfer of Care (Signed)
 Immediate Anesthesia Transfer of Care Note  Patient: Grace [name]  Procedure(s) Performed: RIGHT THUMB A1 PULLEY RELEASE (Right: Thumb)  Patient Location: PACU  Anesthesia Type:MAC  Level of Consciousness: awake, alert , and patient cooperative  Airway & Oxygen Therapy: Patient Spontanous Breathing and Patient connected to face mask oxygen  Post-op Assessment: Report given to RN and Post -op Vital signs reviewed and stable  Post vital signs: Reviewed and stable  Last Vitals:  Vitals Value Taken Time  BP 104/39 10/21/24 09:12  Temp    Pulse 66 10/21/24 09:14  Resp 14 10/21/24 09:14  SpO2 100 % 10/21/24 09:14  Vitals shown include unfiled device data.  Last Pain:  Vitals:   10/21/24 0738  TempSrc: Temporal  PainSc: 2       Patients Stated Pain Goal: 4 (10/21/24 9261)  Complications: No notable events documented.

## 2024-10-21 NOTE — H&P (Signed)
 HAND SURGERY   HPI: Patient is a 63 y.o. female who presents with right trigger thumb that has failed numerous injection.  Patient denies any changes to their medical history or new systemic symptoms today.    Past Medical History:  Diagnosis Date   COPD (chronic obstructive pulmonary disease) (HCC)    GERD (gastroesophageal reflux disease)    Hidradenitis    Hyperlipidemia    Hypertension    Tobacco user    Trigeminal neuralgia    Vitamin D  deficiency    Past Surgical History:  Procedure Laterality Date   CHOLECYSTECTOMY     DILATION AND CURETTAGE OF UTERUS     Social History   Socioeconomic History   Marital status: Divorced    Spouse name: Not on file   Number of children: Not on file   Years of education: Not on file   Highest education level: Not on file  Occupational History   Not on file  Tobacco Use   Smoking status: Every Day    Current packs/day: 1.00    Types: Cigarettes   Smokeless tobacco: Never  Vaping Use   Vaping status: Never Used  Substance and Sexual Activity   Alcohol use: Yes    Comment: rare   Drug use: No   Sexual activity: Not Currently    Birth control/protection: Post-menopausal  Other Topics Concern   Not on file  Social History Narrative   Not on file   Social Drivers of Health   Financial Resource Strain: Low Risk  (10/01/2024)   Overall Financial Resource Strain (CARDIA)    Difficulty of Paying Living Expenses: Not hard at all  Food Insecurity: No Food Insecurity (10/01/2024)   Hunger Vital Sign    Worried About Running Out of Food in the Last Year: Never true    Ran Out of Food in the Last Year: Never true  Transportation Needs: No Transportation Needs (10/01/2024)   PRAPARE - Administrator, Civil Service (Medical): No    Lack of Transportation (Non-Medical): No  Physical Activity: Inactive (10/01/2024)   Exercise Vital Sign    Days of Exercise per Week: 0 days    Minutes of Exercise per Session: 0 min   Stress: No Stress Concern Present (10/01/2024)   [employer] of Occupational Health - Occupational Stress Questionnaire    Feeling of Stress: Only a little  Social Connections: Socially Isolated (10/01/2024)   Social Connection and Isolation Panel    Frequency of Communication with Friends and Family: More than three times a week    Frequency of Social Gatherings with Friends and Family: Three times a week    Attends Religious Services: Never    Active Member of Clubs or Organizations: No    Attends Engineer, structural: Never    Marital Status: Divorced   Family History  Problem Relation Age of Onset   Hypertension Mother    Dementia Mother    Alcohol abuse Mother    Cancer Father        lung   Breast cancer Half-Sister    - negative except otherwise stated in the family history section Allergies  Allergen Reactions   Prednisone      REACTION: face reddened, skin hot to the touch, increased BP   Statins     Muscle aches   Tricor  [Fenofibrate ]     Muscle aches   Prior to Admission medications   Medication Sig Start Date End Date Taking? Authorizing Provider  amLODipine  (  NORVASC ) 5 MG tablet Take 1 tablet (5 mg total) by mouth daily. 04/10/24  Yes Hawks, Christy A, FNP  aspirin EC 81 MG tablet Take 81 mg by mouth daily.   Yes [provider]  cephALEXin  (KEFLEX ) 500 MG capsule Take 1 capsule (500 mg total) by mouth 2 (two) times daily. 07/06/24  Yes Hawks, Christy A, FNP  esomeprazole (NEXIUM) 20 MG packet Take 20 mg by mouth daily before breakfast.   Yes [provider]  famotidine (PEPCID) 20 MG tablet Take 20 mg by mouth 2 (two) times daily.   Yes [provider]  gabapentin  (NEURONTIN ) 100 MG capsule Take 1 capsule (100 mg total) by mouth 3 (three) times daily. 04/10/24  Yes Hawks, Christy A, FNP  HYDROcodone -acetaminophen  (NORCO) 10-325 MG tablet Take 1 tablet by mouth every 6 (six) hours as needed. 10/01/24  Yes Hawks, Christy A, FNP   losartan  (COZAAR ) 25 MG tablet Take 1 tablet (25 mg total) by mouth daily. 04/10/24  Yes Hawks, Christy A, FNP  Multiple Vitamin (MULTIVITAMIN) capsule Take 1 capsule by mouth daily.   Yes [provider]  rosuvastatin  (CRESTOR ) 10 MG tablet Take 1 tablet (10 mg total) by mouth daily. Patient taking differently: Take 10 mg by mouth once a week. 04/10/24 04/10/25 Yes Hawks, Christy A, FNP  traZODone  (DESYREL ) 150 MG tablet Take 1 tablet (150 mg total) by mouth at bedtime. 04/10/24  Yes Hawks, Christy A, FNP  HYDROcodone -acetaminophen  (NORCO) 10-325 MG tablet Take 1 tablet by mouth every 6 (six) hours as needed. 10/30/24   Lavell Bari LABOR, FNP  HYDROcodone -acetaminophen  (NORCO) 10-325 MG tablet Take 1 tablet by mouth every 6 (six) hours as needed. 11/30/24   Lavell Bari A, FNP   No results found. - Positive ROS: All other systems have been reviewed and were otherwise negative with the exception of those mentioned in the HPI and as above.  Physical Exam: General: No acute distress, resting comfortably Cardiovascular: BUE warm and well perfused, normal rate Respiratory: Normal WOB on RA Skin: Warm and dry Neurologic: Sensation intact distally Psychiatric: Patient is at baseline mood and affect  Right Upper Extremity  TTP over thumb A1 pulley with palpable nodule.  No obvious locking/catching with A/PROM of IP joint.  Full AROM of remaining fingers except ring finger at PIP joint which is limited by collateral ligament sprain.   Assessment: 89 F w/ right trigger thumb that has failed conservative management.   Plan: OR today for right trigger thumb A1 pulley release. We again reviewed the risks of surgery which include bleeding, infection, damage to neurovascular structures, persistent symptoms, delayed wound healing, need for additional surgery.  Informed consent was signed.  All questions were answered.   Bebe Galla, M.D. [facility] 7:37 AM

## 2024-10-22 ENCOUNTER — Encounter (HOSPITAL_BASED_OUTPATIENT_CLINIC_OR_DEPARTMENT_OTHER): Payer: Self-pay | Admitting: Orthopedic Surgery

## 2024-10-26 ENCOUNTER — Other Ambulatory Visit: Payer: Self-pay | Admitting: Medical Genetics

## 2024-10-26 DIAGNOSIS — Z006 Encounter for examination for normal comparison and control in clinical research program: Secondary | ICD-10-CM

## 2024-12-17 ENCOUNTER — Encounter: Payer: Self-pay | Admitting: Family

## 2024-12-17 ENCOUNTER — Ambulatory Visit: Admitting: Family

## 2024-12-17 VITALS — BP 125/67 | HR 87 | Temp 98.0°F | Ht 63.0 in | Wt 160.6 lb

## 2024-12-17 DIAGNOSIS — E559 Vitamin D deficiency, unspecified: Secondary | ICD-10-CM

## 2024-12-17 DIAGNOSIS — Z720 Tobacco use: Secondary | ICD-10-CM | POA: Diagnosis not present

## 2024-12-17 DIAGNOSIS — G4700 Insomnia, unspecified: Secondary | ICD-10-CM

## 2024-12-17 DIAGNOSIS — F1120 Opioid dependence, uncomplicated: Secondary | ICD-10-CM | POA: Diagnosis not present

## 2024-12-17 DIAGNOSIS — G8929 Other chronic pain: Secondary | ICD-10-CM | POA: Diagnosis not present

## 2024-12-17 DIAGNOSIS — G500 Trigeminal neuralgia: Secondary | ICD-10-CM | POA: Diagnosis not present

## 2024-12-17 DIAGNOSIS — M545 Low back pain, unspecified: Secondary | ICD-10-CM | POA: Diagnosis not present

## 2024-12-17 DIAGNOSIS — Z79899 Other long term (current) drug therapy: Secondary | ICD-10-CM | POA: Diagnosis not present

## 2024-12-17 DIAGNOSIS — E7800 Pure hypercholesterolemia, unspecified: Secondary | ICD-10-CM

## 2024-12-17 DIAGNOSIS — I10 Essential (primary) hypertension: Secondary | ICD-10-CM | POA: Diagnosis not present

## 2024-12-17 DIAGNOSIS — L732 Hidradenitis suppurativa: Secondary | ICD-10-CM

## 2024-12-17 LAB — CMP14+EGFR
ALT: 8 IU/L (ref 0–32)
AST: 17 IU/L (ref 0–40)
Albumin: 4.1 g/dL (ref 3.9–4.9)
Alkaline Phosphatase: 62 IU/L (ref 49–135)
BUN/Creatinine Ratio: 10 — ABNORMAL LOW (ref 12–28)
BUN: 9 mg/dL (ref 8–27)
Bilirubin Total: 0.2 mg/dL (ref 0.0–1.2)
CO2: 23 mmol/L (ref 20–29)
Calcium: 9.4 mg/dL (ref 8.7–10.3)
Chloride: 104 mmol/L (ref 96–106)
Creatinine, Ser: 0.87 mg/dL (ref 0.57–1.00)
Globulin, Total: 2.7 g/dL (ref 1.5–4.5)
Glucose: 78 mg/dL (ref 70–99)
Potassium: 4.6 mmol/L (ref 3.5–5.2)
Sodium: 141 mmol/L (ref 134–144)
Total Protein: 6.8 g/dL (ref 6.0–8.5)
eGFR: 74 mL/min/1.73 (ref >59)

## 2024-12-17 MED ORDER — ROSUVASTATIN CALCIUM 10 MG PO TABS: 10.0000 mg | ORAL_TABLET | ORAL | 2 refills | Status: AC

## 2024-12-17 MED ORDER — HYDROCODONE-ACETAMINOPHEN 10-325 MG PO TABS: 1.0000 | ORAL_TABLET | Freq: Four times a day (QID) | ORAL | 0 refills | Status: AC | PRN

## 2024-12-17 MED ORDER — CEPHALEXIN 500 MG PO CAPS: 500.0000 mg | ORAL_CAPSULE | Freq: Two times a day (BID) | ORAL | 0 refills | Status: AC

## 2024-12-17 NOTE — Progress Notes (Signed)
 Subjective:    Patient ID: Grace [name], female    DOB: 02/10/1961, 63 y.o.   MRN: [id number]  Chief Complaint  Patient presents with   Medical Management of Chronic Issues   Pt presents to the office today for chronic follow up and pain medication refill.  She has trigeminal neuralgia that comes and goes that can be a 4-10 out 10.    She has hidradenitis and gets abscess in her groin.She has 1 active that are swollen and tender on right groin that is weeping.   Back Pain This is a chronic problem. The current episode started more than 1 year ago. The problem occurs intermittently. The problem has been waxing and waning since onset. The pain is present in the lumbar spine. The quality of the pain is described as aching. The pain is at a severity of 8/10 (7 after doing things). The pain is moderate. The symptoms are aggravated by standing. Risk factors include obesity. She has tried analgesics for the symptoms. The treatment provided moderate relief.  Hypertension This is a chronic problem. The current episode started more than 1 year ago. The problem has been resolved since onset. The problem is controlled. Associated symptoms include malaise/fatigue. Pertinent negatives include no peripheral edema or shortness of breath. Risk factors for coronary artery disease include dyslipidemia and sedentary lifestyle. The current treatment provides moderate improvement.  Nicotine Dependence Presents for follow-up visit. The symptoms have been stable. She smokes 1 pack of cigarettes per day.  Insomnia Primary symptoms: fragmented sleep, sleep disturbance, no difficulty falling asleep, frequent awakening, malaise/fatigue.   The onset quality is gradual. The problem occurs intermittently. Past treatments include meditation. The treatment provided mild relief.  Hyperlipidemia This is a chronic problem. The current episode started more than 1 year ago. The problem is uncontrolled. Recent lipid tests were  reviewed and are high. Pertinent negatives include no shortness of breath. Current antihyperlipidemic treatment includes statins (weekly). The current treatment provides moderate improvement of lipids. Risk factors for coronary artery disease include dyslipidemia, hypertension, a sedentary lifestyle and post-menopausal.   Current opioids rx- Norco 10-325 mg # meds rx- 90 Effectiveness of current meds-Stable  Adverse reactions from pain meds-none Morphine equivalent- 30   Pill count performed-No Last drug screen - 07/06/24 ( high risk q72m, moderate risk q31m, low risk yearly ) Urine drug screen today- Yes Was the [facility] reviewed- yes              If yes were their any concerning findings? - none     Pain contract signed on: 07/06/24   Review of Systems  Constitutional:  Positive for malaise/fatigue.  Respiratory:  Negative for shortness of breath.   Musculoskeletal:  Positive for back pain.  Psychiatric/Behavioral:  Positive for sleep disturbance.   All other systems reviewed and are negative.   Family History  Problem Relation Age of Onset   Hypertension Mother    Dementia Mother    Alcohol abuse Mother    Cancer Father        lung   Breast cancer Half-Sister    Social History   Socioeconomic History   Marital status: Divorced    Spouse name: Not on file   Number of children: Not on file   Years of education: Not on file   Highest education level: Not on file  Occupational History   Not on file  Tobacco Use   Smoking status: Every Day    Current packs/day: 1.00  Types: Cigarettes   Smokeless tobacco: Never  Vaping Use   Vaping status: Never Used  Substance and Sexual Activity   Alcohol use: Yes    Comment: rare   Drug use: No   Sexual activity: Not Currently    Birth control/protection: Post-menopausal  Other Topics Concern   Not on file  Social History Narrative   Not on file   Social Drivers of Health   Tobacco Use: High Risk (12/17/2024)   Patient  History    Smoking Tobacco Use: Every Day    Smokeless Tobacco Use: Never    Passive Exposure: Not on file  Financial Resource Strain: Low Risk (10/01/2024)   Overall Financial Resource Strain (CARDIA)    Difficulty of Paying Living Expenses: Not hard at all  Food Insecurity: No Food Insecurity (10/01/2024)   Epic    Worried About Radiation Protection Practitioner of Food in the Last Year: Never true    Ran Out of Food in the Last Year: Never true  Transportation Needs: No Transportation Needs (10/01/2024)   Epic    Lack of Transportation (Medical): No    Lack of Transportation (Non-Medical): No  Physical Activity: Inactive (10/01/2024)   Exercise Vital Sign    Days of Exercise per Week: 0 days    Minutes of Exercise per Session: 0 min  Stress: No Stress Concern Present (10/01/2024)   [employer] of Occupational Health - Occupational Stress Questionnaire    Feeling of Stress: Only a little  Social Connections: Socially Isolated (10/01/2024)   Social Connection and Isolation Panel    Frequency of Communication with Friends and Family: More than three times a week    Frequency of Social Gatherings with Friends and Family: Three times a week    Attends Religious Services: Never    Active Member of Clubs or Organizations: No    Attends Banker Meetings: Never    Marital Status: Divorced  Depression (PHQ2-9): Medium Risk (12/17/2024)   Depression (PHQ2-9)    PHQ-2 Score: 6  Alcohol Screen: Low Risk (10/01/2024)   Alcohol Screen    Last Alcohol Screening Score (AUDIT): 1  Housing: Low Risk (10/01/2024)   Epic    Unable to Pay for Housing in the Last Year: No    Number of Times Moved in the Last Year: 0    Homeless in the Last Year: No  Utilities: Not At Risk (10/01/2024)   Epic    Threatened with loss of utilities: No  Health Literacy: Adequate Health Literacy (10/01/2024)   B1300 Health Literacy    Frequency of need for help with medical instructions: Never       Objective:    Physical Exam Vitals reviewed.  Constitutional:      General: She is not in acute distress.    Appearance: She is well-developed.  HENT:     Head: Normocephalic and atraumatic.     Right Ear: Tympanic membrane normal.     Left Ear: Tympanic membrane normal.  Eyes:     Pupils: Pupils are equal, round, and reactive to light.  Neck:     Thyroid: No thyromegaly.  Cardiovascular:     Rate and Rhythm: Normal rate and regular rhythm.     Heart sounds: Normal heart sounds. No murmur heard. Pulmonary:     Effort: Pulmonary effort is normal. No respiratory distress.     Breath sounds: Normal breath sounds. No wheezing.  Abdominal:     General: Bowel sounds are normal. There is no  distension.     Palpations: Abdomen is soft.     Tenderness: There is no abdominal tenderness.  Genitourinary:    Comments:   Musculoskeletal:        General: No tenderness. Normal range of motion.     Cervical back: Normal range of motion and neck supple.  Skin:    General: Skin is warm and dry.  Neurological:     Mental Status: She is alert and oriented to person, place, and time.     Cranial Nerves: No cranial nerve deficit.     Deep Tendon Reflexes: Reflexes are normal and symmetric.  Psychiatric:        Behavior: Behavior normal.        Thought Content: Thought content normal.        Judgment: Judgment normal.        BP 125/67   Pulse 87   Temp 98 F (36.7 C) (Temporal)   Ht 5' 3 (1.6 m)   Wt 160 lb 9.6 oz (72.8 kg)   SpO2 100%   BMI 28.45 kg/m   Assessment & Plan:  Grace [name] comes in today with chief complaint of Medical Management of Chronic Issues   Diagnosis and orders addressed:  1. Hidradenitis - cephALEXin  (KEFLEX ) 500 MG capsule; Take 1 capsule (500 mg total) by mouth 2 (two) times daily.  Dispense: 14 capsule; Refill: 0 - CMP14+EGFR  2. Trigeminal neuralgia - HYDROcodone -acetaminophen  (NORCO) 10-325 MG tablet; Take 1 tablet by mouth every 6 (six) hours as needed.   Dispense: 100 tablet; Refill: 0 - HYDROcodone -acetaminophen  (NORCO) 10-325 MG tablet; Take 1 tablet by mouth every 6 (six) hours as needed.  Dispense: 100 tablet; Refill: 0 - HYDROcodone -acetaminophen  (NORCO) 10-325 MG tablet; Take 1 tablet by mouth every 6 (six) hours as needed.  Dispense: 100 tablet; Refill: 0 - CMP14+EGFR  3. Chronic bilateral low back pain, unspecified whether sciatica present (Primary)  - HYDROcodone -acetaminophen  (NORCO) 10-325 MG tablet; Take 1 tablet by mouth every 6 (six) hours as needed.  Dispense: 100 tablet; Refill: 0 - HYDROcodone -acetaminophen  (NORCO) 10-325 MG tablet; Take 1 tablet by mouth every 6 (six) hours as needed.  Dispense: 100 tablet; Refill: 0 - HYDROcodone -acetaminophen  (NORCO) 10-325 MG tablet; Take 1 tablet by mouth every 6 (six) hours as needed.  Dispense: 100 tablet; Refill: 0 - CMP14+EGFR  4. Controlled substance agreement signed - HYDROcodone -acetaminophen  (NORCO) 10-325 MG tablet; Take 1 tablet by mouth every 6 (six) hours as needed.  Dispense: 100 tablet; Refill: 0 - HYDROcodone -acetaminophen  (NORCO) 10-325 MG tablet; Take 1 tablet by mouth every 6 (six) hours as needed.  Dispense: 100 tablet; Refill: 0 - HYDROcodone -acetaminophen  (NORCO) 10-325 MG tablet; Take 1 tablet by mouth every 6 (six) hours as needed.  Dispense: 100 tablet; Refill: 0 - CMP14+EGFR  5. Uncomplicated opioid dependence (HCC) - HYDROcodone -acetaminophen  (NORCO) 10-325 MG tablet; Take 1 tablet by mouth every 6 (six) hours as needed.  Dispense: 100 tablet; Refill: 0 - HYDROcodone -acetaminophen  (NORCO) 10-325 MG tablet; Take 1 tablet by mouth every 6 (six) hours as needed.  Dispense: 100 tablet; Refill: 0 - HYDROcodone -acetaminophen  (NORCO) 10-325 MG tablet; Take 1 tablet by mouth every 6 (six) hours as needed.  Dispense: 100 tablet; Refill: 0 - CMP14+EGFR  6. Chronic low back pain, unspecified back pain laterality, unspecified whether sciatica present -  HYDROcodone -acetaminophen  (NORCO) 10-325 MG tablet; Take 1 tablet by mouth every 6 (six) hours as needed.  Dispense: 100 tablet; Refill: 0 - HYDROcodone -acetaminophen  (NORCO) 10-325 MG tablet;  Take 1 tablet by mouth every 6 (six) hours as needed.  Dispense: 100 tablet; Refill: 0 - HYDROcodone -acetaminophen  (NORCO) 10-325 MG tablet; Take 1 tablet by mouth every 6 (six) hours as needed.  Dispense: 100 tablet; Refill: 0 - CMP14+EGFR  7. Pure hypercholesterolemia  - rosuvastatin  (CRESTOR ) 10 MG tablet; Take 1 tablet (10 mg total) by mouth once a week.  Dispense: 90 tablet; Refill: 2 - CMP14+EGFR  8. Primary hypertension - CMP14+EGFR  9. Insomnia, unspecified type  - CMP14+EGFR  10. Tobacco user  - CMP14+EGFR  11. Vitamin D  deficiency - CMP14+EGFR    Patient reviewed in [place] controlled database, no flags noted. Contract and drug screen are up to date.  Labs pending  Continue current medications  Health Maintenance reviewed Diet and exercise encouraged  Follow up plan: 3 months    Bari Learn, FNP

## 2024-12-17 NOTE — Patient Instructions (Signed)
 Health Maintenance After Age 65 After age 27, you are at a higher risk for certain long-term diseases and infections as well as injuries from falls. Falls are a major cause of broken bones and head injuries in people who are older than age 73. Getting regular preventive care can help to keep you healthy and well. Preventive care includes getting regular testing and making lifestyle changes as recommended by your health care provider. Talk with your health care provider about: Which screenings and tests you should have. A screening is a test that checks for a disease when you have no symptoms. A diet and exercise plan that is right for you. What should I know about screenings and tests to prevent falls? Screening and testing are the best ways to find a health problem early. Early diagnosis and treatment give you the best chance of managing medical conditions that are common after age 90. Certain conditions and lifestyle choices may make you more likely to have a fall. Your health care provider may recommend: Regular vision checks. Poor vision and conditions such as cataracts can make you more likely to have a fall. If you wear glasses, make sure to get your prescription updated if your vision changes. Medicine review. Work with your health care provider to regularly review all of the medicines you are taking, including over-the-counter medicines. Ask your health care provider about any side effects that may make you more likely to have a fall. Tell your health care provider if any medicines that you take make you feel dizzy or sleepy. Strength and balance checks. Your health care provider may recommend certain tests to check your strength and balance while standing, walking, or changing positions. Foot health exam. Foot pain and numbness, as well as not wearing proper footwear, can make you more likely to have a fall. Screenings, including: Osteoporosis screening. Osteoporosis is a condition that causes  the bones to get weaker and break more easily. Blood pressure screening. Blood pressure changes and medicines to control blood pressure can make you feel dizzy. Depression screening. You may be more likely to have a fall if you have a fear of falling, feel depressed, or feel unable to do activities that you used to do. Alcohol  use screening. Using too much alcohol  can affect your balance and may make you more likely to have a fall. Follow these instructions at home: Lifestyle Do not drink alcohol  if: Your health care provider tells you not to drink. If you drink alcohol : Limit how much you have to: 0-1 drink a day for women. 0-2 drinks a day for men. Know how much alcohol  is in your drink. In the [place] one drink equals one 12 oz bottle of beer (355 mL), one 5 oz glass of wine (148 mL), or one 1 oz glass of hard liquor (44 mL). Do not use any products that contain nicotine or tobacco. These products include cigarettes, chewing tobacco, and vaping devices, such as e-cigarettes. If you need help quitting, ask your health care provider. Activity  Follow a regular exercise program to stay fit. This will help you maintain your balance. Ask your health care provider what types of exercise are appropriate for you. If you need a cane or walker, use it as recommended by your health care provider. Wear supportive shoes that have nonskid soles. Safety  Remove any tripping hazards, such as rugs, cords, and clutter. Install safety equipment such as grab bars in bathrooms and safety rails on stairs. Keep rooms and walkways  well-lit. General instructions Talk with your health care provider about your risks for falling. Tell your health care provider if: You fall. Be sure to tell your health care provider about all falls, even ones that seem minor. You feel dizzy, tiredness (fatigue), or off-balance. Take over-the-counter and prescription medicines only as told by your health care provider. These include  supplements. Eat a healthy diet and maintain a healthy weight. A healthy diet includes low-fat dairy products, low-fat (lean) meats, and fiber from whole grains, beans, and lots of fruits and vegetables. Stay current with your vaccines. Schedule regular health, dental, and eye exams. Summary Having a healthy lifestyle and getting preventive care can help to protect your health and wellness after age 15. Screening and testing are the best way to find a health problem early and help you avoid having a fall. Early diagnosis and treatment give you the best chance for managing medical conditions that are more common for people who are older than age 42. Falls are a major cause of broken bones and head injuries in people who are older than age 64. Take precautions to prevent a fall at home. Work with your health care provider to learn what changes you can make to improve your health and wellness and to prevent falls. This information is not intended to replace advice given to you by your health care provider. Make sure you discuss any questions you have with your health care provider. Document Revised: 05/08/2021 Document Reviewed: 05/08/2021 Elsevier Patient Education  2024 [employer].

## 2024-12-18 ENCOUNTER — Telehealth: Payer: Self-pay | Admitting: *Deleted

## 2024-12-18 ENCOUNTER — Ambulatory Visit: Payer: Self-pay | Admitting: Family

## 2024-12-18 DIAGNOSIS — E7800 Pure hypercholesterolemia, unspecified: Secondary | ICD-10-CM

## 2024-12-18 MED ORDER — ROSUVASTATIN CALCIUM 10 MG PO TABS: 10.0000 mg | ORAL_TABLET | ORAL | 2 refills | Status: AC

## 2024-12-18 NOTE — Telephone Encounter (Signed)
 Prescription sent to pharmacy.

## 2024-12-18 NOTE — Telephone Encounter (Signed)
 Patient states she takes it 1-3 times a week

## 2024-12-18 NOTE — Telephone Encounter (Signed)
 Please call patient and verify how she takes Crestor .

## 2024-12-18 NOTE — Telephone Encounter (Signed)
 Fax from [facility] pharmacy RE: Rosuvastatin  script Clarification on directions of once a week Qty given was #[phone] Please advise

## 2024-12-18 NOTE — Addendum Note (Signed)
 Addended by: LAVELL LYE A on: 12/18/2024 04:13 PM   Modules accepted: Orders

## 2025-01-01 ENCOUNTER — Ambulatory Visit: Admitting: Family

## 2025-01-12 ENCOUNTER — Ambulatory Visit: Payer: Self-pay | Admitting: Family

## 2025-03-18 ENCOUNTER — Ambulatory Visit: Admitting: Family
